# Patient Record
Sex: Female | Born: 1975
Health system: Southern US, Community
[De-identification: ages and names within clinical notes are randomized; demographics above are authoritative.]

## PROBLEM LIST (undated history)

## (undated) ENCOUNTER — Inpatient Hospital Stay (HOSPITAL_COMMUNITY): Payer: Self-pay

## (undated) DIAGNOSIS — T783XXA Angioneurotic edema, initial encounter: Secondary | ICD-10-CM

## (undated) DIAGNOSIS — E785 Hyperlipidemia, unspecified: Secondary | ICD-10-CM

## (undated) DIAGNOSIS — J45909 Unspecified asthma, uncomplicated: Secondary | ICD-10-CM

## (undated) DIAGNOSIS — T7840XA Allergy, unspecified, initial encounter: Secondary | ICD-10-CM

## (undated) DIAGNOSIS — N83209 Unspecified ovarian cyst, unspecified side: Secondary | ICD-10-CM

## (undated) DIAGNOSIS — F32A Depression, unspecified: Secondary | ICD-10-CM

## (undated) DIAGNOSIS — Z5189 Encounter for other specified aftercare: Secondary | ICD-10-CM

## (undated) DIAGNOSIS — G4700 Insomnia, unspecified: Secondary | ICD-10-CM

## (undated) DIAGNOSIS — E559 Vitamin D deficiency, unspecified: Secondary | ICD-10-CM

## (undated) DIAGNOSIS — F329 Major depressive disorder, single episode, unspecified: Secondary | ICD-10-CM

## (undated) DIAGNOSIS — L509 Urticaria, unspecified: Secondary | ICD-10-CM

## (undated) DIAGNOSIS — M25561 Pain in right knee: Secondary | ICD-10-CM

## (undated) DIAGNOSIS — U071 COVID-19: Secondary | ICD-10-CM

## (undated) DIAGNOSIS — G8929 Other chronic pain: Secondary | ICD-10-CM

## (undated) HISTORY — DX: Depression, unspecified: F32.A

## (undated) HISTORY — PX: TONSILLECTOMY: SUR1361

## (undated) HISTORY — DX: Insomnia, unspecified: G47.00

## (undated) HISTORY — DX: Hyperlipidemia, unspecified: E78.5

## (undated) HISTORY — PX: ESOPHAGOGASTRODUODENOSCOPY: SHX1529

## (undated) HISTORY — DX: Unspecified asthma, uncomplicated: J45.909

## (undated) HISTORY — DX: Vitamin D deficiency, unspecified: E55.9

## (undated) HISTORY — DX: Allergy, unspecified, initial encounter: T78.40XA

## (undated) HISTORY — DX: Major depressive disorder, single episode, unspecified: F32.9

## (undated) HISTORY — PX: COLON SURGERY: SHX602

## (undated) HISTORY — DX: Urticaria, unspecified: L50.9

## (undated) HISTORY — DX: Unspecified ovarian cyst, unspecified side: N83.209

## (undated) HISTORY — DX: Pain in right knee: M25.561

## (undated) HISTORY — DX: Encounter for other specified aftercare: Z51.89

## (undated) HISTORY — DX: Other chronic pain: G89.29

## (undated) HISTORY — PX: ADENOIDECTOMY: SUR15

## (undated) HISTORY — PX: TYMPANOSTOMY TUBE PLACEMENT: SHX32

## (undated) HISTORY — DX: Angioneurotic edema, initial encounter: T78.3XXA

## (undated) HISTORY — PX: DILATION AND CURETTAGE OF UTERUS: SHX78

---

## 2004-12-08 ENCOUNTER — Other Ambulatory Visit: Admission: RE | Admit: 2004-12-08 | Discharge: 2004-12-08 | Payer: Self-pay | Admitting: Obstetrics and Gynecology

## 2008-12-13 ENCOUNTER — Encounter: Admission: RE | Admit: 2008-12-13 | Discharge: 2008-12-13 | Payer: Self-pay | Admitting: Obstetrics and Gynecology

## 2009-07-10 ENCOUNTER — Encounter: Admission: RE | Admit: 2009-07-10 | Discharge: 2009-07-10 | Payer: Self-pay | Admitting: Obstetrics and Gynecology

## 2010-01-10 ENCOUNTER — Encounter: Admission: RE | Admit: 2010-01-10 | Discharge: 2010-01-10 | Payer: Self-pay | Admitting: Obstetrics and Gynecology

## 2010-06-05 ENCOUNTER — Encounter (HOSPITAL_COMMUNITY)
Admission: RE | Admit: 2010-06-05 | Discharge: 2010-06-05 | Disposition: A | Discharge status: Home / Self Care | Payer: BC Managed Care – PPO | Source: Ambulatory Visit | Attending: Obstetrics and Gynecology | Admitting: Obstetrics and Gynecology

## 2010-06-05 LAB — CBC
Hemoglobin: 12.4 g/dL (ref 12.0–15.0)
MCHC: 32.6 g/dL (ref 30.0–36.0)
RBC: 4.51 MIL/uL (ref 3.87–5.11)

## 2010-06-06 ENCOUNTER — Ambulatory Visit (HOSPITAL_COMMUNITY)
Admission: RE | Admit: 2010-06-06 | Discharge: 2010-06-06 | Disposition: A | Discharge status: Home / Self Care | Payer: BC Managed Care – PPO | Source: Ambulatory Visit | Attending: Obstetrics and Gynecology | Admitting: Obstetrics and Gynecology

## 2010-06-06 ENCOUNTER — Other Ambulatory Visit (HOSPITAL_COMMUNITY): Payer: Self-pay | Admitting: Obstetrics and Gynecology

## 2010-06-06 DIAGNOSIS — N920 Excessive and frequent menstruation with regular cycle: Secondary | ICD-10-CM | POA: Insufficient documentation

## 2010-06-06 DIAGNOSIS — N841 Polyp of cervix uteri: Secondary | ICD-10-CM | POA: Insufficient documentation

## 2010-06-06 DIAGNOSIS — N840 Polyp of corpus uteri: Secondary | ICD-10-CM | POA: Insufficient documentation

## 2010-06-06 LAB — PREGNANCY, URINE: Preg Test, Ur: NEGATIVE

## 2010-07-17 NOTE — Op Note (Signed)
  NAME:  Cindy Gilbert, Cindy Gilbert NO.:  [id number]  MEDICAL RECORD NO.:  [id number]           PATIENT TYPE:  O  LOCATION:  [facility]                          [facility]AC[facility]  [facility]  PHYSICIAN:  Zelphia Cairo, MD    DATE OF BIRTH:  1975-06-18  DATE OF PROCEDURE:  06/06/2010 DATE OF DISCHARGE:  06/06/2010                              OPERATIVE REPORT   PREOPERATIVE DIAGNOSES: 1. Menorrhagia. 2. Suspect endometrial polyp.  PROCEDURE: 1. Cervical block. 2. Hysteroscopy. 3. Dilation and curettage.  SURGEON:  Zelphia Cairo, MD  ANESTHESIA:  General.  SPECIMEN:  Endometrial curettings to Pathology.  COMPLICATIONS:  None.  CONDITION:  Stable to recovery room.  PROCEDURE IN DETAIL:  [name] was taken to the operating room after informed consent was obtained.  She was placed in the dorsal lithotomy position using Allen stirrups, prepped and draped in sterile fashion. An in-and-out catheter was used to drain her bladder.  Bivalve speculum was placed in the vagina, and a single-tooth tenaculum was placed on the anterior lip of the cervix.  The cervix was serially dilated using Pratt dilators, and hysteroscope was inserted into the endometrial cavity. Polypoid endometrial lining was noted.  Bilateral ostia were visualized. The hysteroscope was then removed and a gentle curetting was performed. Specimen was placed on Telfa and passed off to be sent to Pathology. Polypoid masses were noted within the endometrium and at the internal cervical os.  Diagnostic hysteroscope was then removed, and a gentle curetting was performed.  Specimen was placed on Telfa and passed off to be sent to Pathology.  The hysteroscope was reinserted.  No other masses or abnormalities were noted within the cervix or uterus.  All polyps were noted to be removed.  Hysteroscope was removed.  Tenaculum was removed.  The cervix was hemostatic.  The speculum was removed.  The patient was taken to the recovery room  in stable condition.  Sponge lap, needle, and instrument counts were correct x2     Zelphia Cairo, MD     GA/MEDQ  D:  06/17/2010  T:  06/18/2010  Job:  [id number]  Electronically Signed by Zelphia Cairo MD on 07/17/2010 09:46:37 AM

## 2011-04-08 ENCOUNTER — Other Ambulatory Visit: Payer: Self-pay | Admitting: Obstetrics and Gynecology

## 2011-04-08 DIAGNOSIS — N6311 Unspecified lump in the right breast, upper outer quadrant: Secondary | ICD-10-CM

## 2011-04-16 ENCOUNTER — Ambulatory Visit
Admission: RE | Admit: 2011-04-16 | Discharge: 2011-04-16 | Disposition: A | Discharge status: Home / Self Care | Payer: BC Managed Care – PPO | Source: Ambulatory Visit | Attending: Obstetrics and Gynecology | Admitting: Obstetrics and Gynecology

## 2011-04-16 DIAGNOSIS — N6311 Unspecified lump in the right breast, upper outer quadrant: Secondary | ICD-10-CM

## 2012-06-05 ENCOUNTER — Emergency Department (HOSPITAL_COMMUNITY)
Admission: EM | Admit: 2012-06-05 | Discharge: 2012-06-06 | Disposition: A | Discharge status: Home / Self Care | Payer: BC Managed Care – PPO | Attending: Emergency Medicine | Admitting: Emergency Medicine

## 2012-06-05 ENCOUNTER — Encounter (HOSPITAL_COMMUNITY): Payer: Self-pay | Admitting: Emergency Medicine

## 2012-06-05 DIAGNOSIS — R112 Nausea with vomiting, unspecified: Secondary | ICD-10-CM

## 2012-06-05 DIAGNOSIS — R509 Fever, unspecified: Secondary | ICD-10-CM | POA: Insufficient documentation

## 2012-06-05 DIAGNOSIS — R61 Generalized hyperhidrosis: Secondary | ICD-10-CM | POA: Insufficient documentation

## 2012-06-05 DIAGNOSIS — A084 Viral intestinal infection, unspecified: Secondary | ICD-10-CM

## 2012-06-05 DIAGNOSIS — A088 Other specified intestinal infections: Secondary | ICD-10-CM | POA: Insufficient documentation

## 2012-06-05 DIAGNOSIS — R160 Hepatomegaly, not elsewhere classified: Secondary | ICD-10-CM | POA: Insufficient documentation

## 2012-06-05 DIAGNOSIS — M549 Dorsalgia, unspecified: Secondary | ICD-10-CM | POA: Insufficient documentation

## 2012-06-05 DIAGNOSIS — F17200 Nicotine dependence, unspecified, uncomplicated: Secondary | ICD-10-CM | POA: Insufficient documentation

## 2012-06-05 LAB — COMPREHENSIVE METABOLIC PANEL
ALT: 8 U/L (ref 0–35)
AST: 18 U/L (ref 0–37)
Alkaline Phosphatase: 63 U/L (ref 39–117)
CO2: 23 mEq/L (ref 19–32)
Chloride: 101 mEq/L (ref 96–112)
Creatinine, Ser: 0.7 mg/dL (ref 0.50–1.10)
GFR calc non Af Amer: >90 mL/min (ref >90)
Potassium: 4.2 mEq/L (ref 3.5–5.1)
Sodium: 135 mEq/L (ref 135–145)
Total Bilirubin: 0.6 mg/dL (ref 0.3–1.2)

## 2012-06-05 LAB — CBC WITH DIFFERENTIAL/PLATELET
Basophils Absolute: 0 10*3/uL (ref 0.0–0.1)
HCT: 39.3 % (ref 36.0–46.0)
Lymphocytes Relative: 6 % — ABNORMAL LOW (ref 12–46)
Monocytes Absolute: 0.3 10*3/uL (ref 0.1–1.0)
Neutro Abs: 8.3 10*3/uL — ABNORMAL HIGH (ref 1.7–7.7)
RDW: 12.4 % (ref 11.5–15.5)
WBC: 9.1 10*3/uL (ref 4.0–10.5)

## 2012-06-05 LAB — URINALYSIS, ROUTINE W REFLEX MICROSCOPIC
Glucose, UA: NEGATIVE mg/dL
Hgb urine dipstick: NEGATIVE
Ketones, ur: 40 mg/dL — AB
Protein, ur: NEGATIVE mg/dL

## 2012-06-05 MED ORDER — KETOROLAC TROMETHAMINE 30 MG/ML IJ SOLN
30.0000 mg | Freq: Once | INTRAMUSCULAR | Status: AC
  Administered 2012-06-05: 30 mg via INTRAVENOUS
  Filled 2012-06-05: qty 1

## 2012-06-05 MED ORDER — ONDANSETRON HCL 4 MG/2ML IJ SOLN
4.0000 mg | Freq: Once | INTRAMUSCULAR | Status: AC
  Administered 2012-06-05: 4 mg via INTRAVENOUS
  Filled 2012-06-05: qty 2

## 2012-06-05 MED ORDER — SODIUM CHLORIDE 0.9 % IV BOLUS (SEPSIS)
1000.0000 mL | Freq: Once | INTRAVENOUS | Status: AC
  Administered 2012-06-05: 1000 mL via INTRAVENOUS

## 2012-06-05 MED ORDER — METOCLOPRAMIDE HCL 5 MG/ML IJ SOLN
10.0000 mg | Freq: Once | INTRAMUSCULAR | Status: AC
  Administered 2012-06-05: 10 mg via INTRAVENOUS
  Filled 2012-06-05: qty 2

## 2012-06-05 NOTE — ED Provider Notes (Signed)
History     CSN: [id number]  Arrival date & time 06/05/12  1955   First MD Initiated Contact with Patient 06/05/12 2211      Chief Complaint  Patient presents with  . Abdominal Pain   HPI  History provided by the patient. Patient is a 37 year old female with no significant PMH who presents with complaints of acute onset nausea vomiting, diarrhea and abdominal pains. Symptoms first began with mild abdominal pain with nausea vomiting around 10 AM this morning. Patient has had multiple episodes of vomiting without hematemesis. She has had worsening generalized abdominal pain and cramping. She also reports several episodes of loose watery diarrhea without blood or mucus. Symptoms have been associated with subjective fever, chills and sweats. Patient attempted to use some Pepto-Bismol but had emesis shortly after. Denies any other aggravating or alleviating factors. She denies any recent travel. No known sick contacts. Patient does work in the school system.    History reviewed. No pertinent past medical history.  Past Surgical History  Procedure Laterality Date  . Dilation and curettage of uterus    . Tonsillectomy      No family history on file.  History  Substance Use Topics  . Smoking status: Current Some Day Smoker  . Smokeless tobacco: Not on file  . Alcohol Use: Yes     Comment: occ    OB History   Grav Para Term Preterm Abortions TAB SAB Ect Mult Living                  Review of Systems  Constitutional: Positive for fever, chills, diaphoresis and appetite change.  Respiratory: Negative for cough and shortness of breath.   Cardiovascular: Negative for chest pain.  Gastrointestinal: Positive for nausea, vomiting, abdominal pain and diarrhea. Negative for blood in stool and rectal pain.  Genitourinary: Negative for dysuria, frequency, hematuria, flank pain, vaginal bleeding and vaginal discharge.  Musculoskeletal: Positive for back pain.  All other systems reviewed  and are negative.    Allergies  Review of patient's allergies indicates no known allergies.  Home Medications   Current Outpatient Rx  Name  Route  Sig  Dispense  Refill  . bismuth subsalicylate (PEPTO BISMOL) 262 MG/15ML suspension   Oral   Take 15 mLs by mouth every 6 (six) hours as needed for indigestion.           BP 111/69  Pulse 99  Temp(Src) 100.2 F (37.9 C) (Oral)  Resp 19  Ht 5\' 2"  (1.575 m)  Wt 180 lb (81.647 kg)  BMI 32.91 kg/m2  SpO2 98%  LMP 05/29/2012  Physical Exam  Nursing note and vitals reviewed. Constitutional: She is oriented to person, place, and time. She appears well-developed and well-nourished. No distress.  HENT:  Head: Normocephalic.  Cardiovascular: Normal rate and regular rhythm.   No murmur heard. Pulmonary/Chest: Effort normal and breath sounds normal. No respiratory distress. She has no wheezes. She has no rales.  Abdominal: Soft. She exhibits no distension and no mass. There is tenderness. There is no rebound and no guarding.  Diffuse abdominal tenderness slightly worse in the upper quadrants. No rebound tenderness or peritoneal signs.  Musculoskeletal: Normal range of motion. She exhibits no edema.  Neurological: She is alert and oriented to person, place, and time.  Skin: Skin is warm and dry. No rash noted.  Psychiatric: She has a normal mood and affect. Her behavior is normal.    ED Course  Procedures   Results for  orders placed during the hospital encounter of 06/05/12  CBC WITH DIFFERENTIAL      Result Value Range   WBC 9.1  4.0 - 10.5 K/uL   RBC 4.80  3.87 - 5.11 MIL/uL   Hemoglobin 13.6  12.0 - 15.0 g/dL   HCT 16.1  09.6 - 04.5 %   MCV 81.9  78.0 - 100.0 fL   MCH 28.3  26.0 - 34.0 pg   MCHC 34.6  30.0 - 36.0 g/dL   RDW 40.9  81.1 - 91.4 %   Platelets 246  150 - 400 K/uL   Neutrophils Relative 91 (*) 43 - 77 %   Neutro Abs 8.3 (*) 1.7 - 7.7 K/uL   Lymphocytes Relative 6 (*) 12 - 46 %   Lymphs Abs 0.5 (*) 0.7 -  4.0 K/uL   Monocytes Relative 3  3 - 12 %   Monocytes Absolute 0.3  0.1 - 1.0 K/uL   Eosinophils Relative 0  0 - 5 %   Eosinophils Absolute 0.0  0.0 - 0.7 K/uL   Basophils Relative 0  0 - 1 %   Basophils Absolute 0.0  0.0 - 0.1 K/uL  COMPREHENSIVE METABOLIC PANEL      Result Value Range   Sodium 135  135 - 145 mEq/L   Potassium 4.2  3.5 - 5.1 mEq/L   Chloride 101  96 - 112 mEq/L   CO2 23  19 - 32 mEq/L   Glucose, Bld 95  70 - 99 mg/dL   BUN 18  6 - 23 mg/dL   Creatinine, Ser 7.82  0.50 - 1.10 mg/dL   Calcium 8.7  8.4 - 95.6 mg/dL   Total Protein 7.1  6.0 - 8.3 g/dL   Albumin 3.7  3.5 - 5.2 g/dL   AST 18  0 - 37 U/L   ALT 8  0 - 35 U/L   Alkaline Phosphatase 63  39 - 117 U/L   Total Bilirubin 0.6  0.3 - 1.2 mg/dL   GFR calc non Af Amer >90  >90 mL/min   GFR calc Af Amer >90  >90 mL/min  LIPASE, BLOOD      Result Value Range   Lipase 11  11 - 59 U/L  URINALYSIS, ROUTINE W REFLEX MICROSCOPIC      Result Value Range   Color, Urine YELLOW  YELLOW   APPearance CLEAR  CLEAR   Specific Gravity, Urine 1.036 (*) 1.005 - 1.030   pH 7.0  5.0 - 8.0   Glucose, UA NEGATIVE  NEGATIVE mg/dL   Hgb urine dipstick NEGATIVE  NEGATIVE   Bilirubin Urine SMALL (*) NEGATIVE   Ketones, ur 40 (*) NEGATIVE mg/dL   Protein, ur NEGATIVE  NEGATIVE mg/dL   Urobilinogen, UA 1.0  0.0 - 1.0 mg/dL   Nitrite NEGATIVE  NEGATIVE   Leukocytes, UA NEGATIVE  NEGATIVE  POCT PREGNANCY, URINE      Result Value Range   Preg Test, Ur NEGATIVE  NEGATIVE        1. Nausea vomiting and diarrhea   2. Viral enteritis       MDM  10:20 PM patient seen and evaluated. Patient appears uncomfortable but in no acute distress.  Patient feeling significantly better after IV fluids and antiemetics. She is tolerating by mouth fluids. Patient feels ready to be discharged home.      Angus Seller, PA-C 06/07/12 [phone]

## 2012-06-05 NOTE — ED Notes (Signed)
Pt c/o abd pain onset 10 am this morning. Emesis x 7 today. Diarrhea x 1. Chills, fever.

## 2012-06-06 MED ORDER — ONDANSETRON 8 MG PO TBDP: ORAL_TABLET | ORAL | Status: DC

## 2012-06-06 MED ORDER — PROMETHAZINE HCL 25 MG PO TABS: 25.0000 mg | ORAL_TABLET | Freq: Four times a day (QID) | ORAL | Status: DC | PRN

## 2012-06-07 NOTE — ED Provider Notes (Signed)
  Medical screening examination/treatment/procedure(s) were performed by non-physician practitioner and as supervising physician I was immediately available for consultation/collaboration.   Derwood Kaplan, MD 06/07/12 1517

## 2014-07-14 ENCOUNTER — Encounter (HOSPITAL_COMMUNITY): Payer: Self-pay

## 2014-07-14 ENCOUNTER — Inpatient Hospital Stay (HOSPITAL_COMMUNITY): Payer: Medicaid Other

## 2014-07-14 ENCOUNTER — Inpatient Hospital Stay (HOSPITAL_COMMUNITY)
Admission: AD | Admit: 2014-07-14 | Discharge: 2014-07-14 | Disposition: A | Discharge status: Home / Self Care | Payer: Medicaid Other | Source: Ambulatory Visit | Attending: Obstetrics and Gynecology | Admitting: Obstetrics and Gynecology

## 2014-07-14 DIAGNOSIS — N831 Corpus luteum cyst: Secondary | ICD-10-CM | POA: Insufficient documentation

## 2014-07-14 DIAGNOSIS — O99330 Smoking (tobacco) complicating pregnancy, unspecified trimester: Secondary | ICD-10-CM | POA: Diagnosis not present

## 2014-07-14 DIAGNOSIS — O4690 Antepartum hemorrhage, unspecified, unspecified trimester: Secondary | ICD-10-CM | POA: Insufficient documentation

## 2014-07-14 DIAGNOSIS — N939 Abnormal uterine and vaginal bleeding, unspecified: Secondary | ICD-10-CM | POA: Diagnosis present

## 2014-07-14 DIAGNOSIS — R58 Hemorrhage, not elsewhere classified: Secondary | ICD-10-CM

## 2014-07-14 DIAGNOSIS — Z3A00 Weeks of gestation of pregnancy not specified: Secondary | ICD-10-CM | POA: Insufficient documentation

## 2014-07-14 DIAGNOSIS — O3680X0 Pregnancy with inconclusive fetal viability, not applicable or unspecified: Secondary | ICD-10-CM

## 2014-07-14 DIAGNOSIS — O3480 Maternal care for other abnormalities of pelvic organs, unspecified trimester: Secondary | ICD-10-CM | POA: Insufficient documentation

## 2014-07-14 DIAGNOSIS — Z331 Pregnant state, incidental: Secondary | ICD-10-CM | POA: Diagnosis not present

## 2014-07-14 DIAGNOSIS — F17210 Nicotine dependence, cigarettes, uncomplicated: Secondary | ICD-10-CM | POA: Diagnosis not present

## 2014-07-14 LAB — URINE MICROSCOPIC-ADD ON

## 2014-07-14 LAB — HCG, QUANTITATIVE, PREGNANCY: hCG, Beta Chain, Quant, S: 747 m[IU]/mL — ABNORMAL HIGH (ref <5)

## 2014-07-14 LAB — URINALYSIS, ROUTINE W REFLEX MICROSCOPIC
Bilirubin Urine: NEGATIVE
Glucose, UA: NEGATIVE mg/dL
Ketones, ur: 15 mg/dL — AB
LEUKOCYTES UA: NEGATIVE
Nitrite: NEGATIVE
Protein, ur: NEGATIVE mg/dL
Specific Gravity, Urine: 1.02 (ref 1.005–1.030)
UROBILINOGEN UA: 0.2 mg/dL (ref 0.0–1.0)
pH: 6.5 (ref 5.0–8.0)

## 2014-07-14 LAB — POCT PREGNANCY, URINE: PREG TEST UR: POSITIVE — AB

## 2014-07-14 LAB — WET PREP, GENITAL
Clue Cells Wet Prep HPF POC: NONE SEEN
Trich, Wet Prep: NONE SEEN
WBC, Wet Prep HPF POC: NONE SEEN
Yeast Wet Prep HPF POC: NONE SEEN

## 2014-07-14 LAB — CBC
HCT: 34.5 % — ABNORMAL LOW (ref 36.0–46.0)
Hemoglobin: 11.9 g/dL — ABNORMAL LOW (ref 12.0–15.0)
MCH: 28.4 pg (ref 26.0–34.0)
MCHC: 34.5 g/dL (ref 30.0–36.0)
MCV: 82.3 fL (ref 78.0–100.0)
Platelets: 223 10*3/uL (ref 150–400)
RBC: 4.19 MIL/uL (ref 3.87–5.11)
RDW: 13.5 % (ref 11.5–15.5)
WBC: 6.9 10*3/uL (ref 4.0–10.5)

## 2014-07-14 NOTE — MAU Provider Note (Signed)
History     CSN: [id number]  Arrival date and time: 07/14/14 2034   First Provider Initiated Contact with Patient 07/14/14 2104      Chief Complaint  Patient presents with  . Vaginal Bleeding   Vaginal Bleeding The patient's primary symptoms include vaginal bleeding. The current episode started in the past 7 days. The problem occurs intermittently. The problem has been unchanged. The patient is experiencing no pain. She is pregnant. Pertinent negatives include no abdominal pain or fever. The vaginal discharge was bloody. The vaginal bleeding is lighter than menses. She has been passing clots. She has not been passing tissue. It is unknown whether or not her partner has an STD. She uses nothing for contraception. Her past medical history is significant for miscarriage.    39 y.o. [id number] @ 37w4dpresents to the MAU with the complaint of vaginal bleeding intermittently during the past week. She had a positive home pregnancy test yesterday. She is not expereincing any pain. She is crying because she thinks she may be having a miscarriage. She stated that she had a miscarraige in 2010. She was using the rhythm method for birth control   Past Medical History  Diagnosis Date  . Medical history non-contributory     Past Surgical History  Procedure Laterality Date  . Dilation and curettage of uterus    . Tonsillectomy      Family History  Problem Relation Age of Onset  . Heart disease Father     History  Substance Use Topics  . Smoking status: Current Some Day Smoker -- 0.25 packs/day for 10 years    Types: Cigarettes  . Smokeless tobacco: Not on file  . Alcohol Use: Yes     Comment: occ    Allergies: No Known Allergies  Prescriptions prior to admission  Medication Sig Dispense Refill Last Dose  . bismuth subsalicylate (PEPTO BISMOL) 262 MG/15ML suspension Take 15 mLs by mouth every 6 (six) hours as needed for indigestion.   06/05/2012 at Unknown  . ondansetron (ZOFRAN ODT) 8  MG disintegrating tablet 842mODT q4 hours prn nausea 20 tablet 0   . promethazine (PHENERGAN) 25 MG tablet Take 1 tablet (25 mg total) by mouth every 6 (six) hours as needed for nausea. 20 tablet 0     Review of Systems  Constitutional: Negative for fever.  Gastrointestinal: Negative for abdominal pain.  Genitourinary: Positive for vaginal bleeding.       Vaginal bleeding  All other systems reviewed and are negative.  Physical Exam   Blood pressure 114/67, pulse 69, temperature 98.8 F (37.1 C), temperature source Oral, resp. rate 18, last menstrual period 06/05/2014.  Physical Exam  Nursing note and vitals reviewed. Constitutional: She is oriented to person, place, and time. She appears well-developed and well-nourished. No distress.  HENT:  Head: Normocephalic and atraumatic.  Neck: Normal range of motion.  Cardiovascular: Normal rate.   Respiratory: Effort normal. No respiratory distress.  GI: Soft. She exhibits no distension and no mass. There is no tenderness. There is no rebound and no guarding.  Genitourinary: Uterus normal. There is no rash, tenderness, lesion or injury on the right labia. There is no rash, tenderness, lesion or injury on the left labia. Uterus is not enlarged. Cervix exhibits no motion tenderness, no discharge and no friability. Right adnexum displays no mass, no tenderness and no fullness. Left adnexum displays no mass, no tenderness and no fullness. There is bleeding in the vagina. No erythema or tenderness in  the vagina. No foreign body around the vagina. No signs of injury around the vagina. No vaginal discharge found.  Musculoskeletal: Normal range of motion. She exhibits no edema or tenderness.  Neurological: She is alert and oriented to person, place, and time.  Skin: Skin is warm and dry.  Psychiatric: She has a normal mood and affect. Her behavior is normal. Judgment and thought content normal.   Results for orders placed or performed during the  hospital encounter of 07/14/14 (from the past 24 hour(s))  Urinalysis, Routine w reflex microscopic     Status: Abnormal   Collection Time: 07/14/14  8:41 PM  Result Value Ref Range   Color, Urine YELLOW YELLOW   APPearance CLEAR CLEAR   Specific Gravity, Urine 1.020 1.005 - 1.030   pH 6.5 5.0 - 8.0   Glucose, UA NEGATIVE NEGATIVE mg/dL   Hgb urine dipstick LARGE (A) NEGATIVE   Bilirubin Urine NEGATIVE NEGATIVE   Ketones, ur 15 (A) NEGATIVE mg/dL   Protein, ur NEGATIVE NEGATIVE mg/dL   Urobilinogen, UA 0.2 0.0 - 1.0 mg/dL   Nitrite NEGATIVE NEGATIVE   Leukocytes, UA NEGATIVE NEGATIVE  Urine microscopic-add on     Status: None   Collection Time: 07/14/14  8:41 PM  Result Value Ref Range   Squamous Epithelial / LPF RARE RARE   WBC, UA 0-2 <3 WBC/hpf   RBC / HPF 3-6 <3 RBC/hpf   Bacteria, UA RARE RARE   Urine-Other MUCOUS PRESENT   Pregnancy, urine POC     Status: Abnormal   Collection Time: 07/14/14  8:49 PM  Result Value Ref Range   Preg Test, Ur POSITIVE (A) NEGATIVE  Wet prep, genital     Status: None   Collection Time: 07/14/14  9:16 PM  Result Value Ref Range   Yeast Wet Prep HPF POC NONE SEEN NONE SEEN   Trich, Wet Prep NONE SEEN NONE SEEN   Clue Cells Wet Prep HPF POC NONE SEEN NONE SEEN   WBC, Wet Prep HPF POC NONE SEEN NONE SEEN  hCG, quantitative, pregnancy     Status: Abnormal   Collection Time: 07/14/14  9:25 PM  Result Value Ref Range   hCG, Beta Chain, Quant, S 747 (H) <5 mIU/mL  CBC     Status: Abnormal   Collection Time: 07/14/14  9:25 PM  Result Value Ref Range   WBC 6.9 4.0 - 10.5 K/uL   RBC 4.19 3.87 - 5.11 MIL/uL   Hemoglobin 11.9 (L) 12.0 - 15.0 g/dL   HCT 34.5 (L) 36.0 - 46.0 %   MCV 82.3 78.0 - 100.0 fL   MCH 28.4 26.0 - 34.0 pg   MCHC 34.5 30.0 - 36.0 g/dL   RDW 13.5 11.5 - 15.5 %   Platelets 223 150 - 400 K/uL  ABO/Rh     Status: None (Preliminary result)   Collection Time: 07/14/14  9:25 PM  Result Value Ref Range   ABO/RH(D) A POS   US  Ob Comp Less 14 Wks  07/14/2014   CLINICAL DATA:  Pregnant patient with vaginal bleeding for 4 days. Beta HCG 747. Last menstrual period 06/05/2014  EXAM: OBSTETRIC <14 WK [place] AND TRANSVAGINAL OB US  TECHNIQUE: Both transabdominal and transvaginal ultrasound examinations were performed for complete evaluation of the gestation as well as the maternal uterus, adnexal regions, and pelvic cul-de-sac. Transvaginal technique was performed to assess early pregnancy.  COMPARISON:  None.  FINDINGS: Intrauterine gestational sac: Not visualized.  Yolk sac:  Not  visualized.  Embryo:  Not visualized.  Cardiac Activity: Not visualized.  Maternal uterus/adnexae: The uterus is anteverted. The endometrial thickness is 14 mm. No fluid in the endometrial canal. Left ovary measures 3.6 x 1.7 x 2.3 cm and contains a corpus luteal cyst. Normal blood flow seen. The right ovary measures 2.8 x 1.3 x 1.8 cm with normal blood flow. No pelvic free fluid.  IMPRESSION: 1. No intrauterine gestation. No findings of ectopic pregnancy. This is consistent with pregnancy of unknown location. Recommend trending of beta HCG and follow-up ultrasound in 10-14 days. 2. Corpus luteal cyst in the left ovary.   Electronically Signed   By: Jeb Levering M.D.   On: 07/14/2014 22:56   US Ob Transvaginal  07/14/2014   CLINICAL DATA:  Pregnant patient with vaginal bleeding for 4 days. Beta HCG 747. Last menstrual period 06/05/2014  EXAM: OBSTETRIC <14 WK [place] AND TRANSVAGINAL OB US  TECHNIQUE: Both transabdominal and transvaginal ultrasound examinations were performed for complete evaluation of the gestation as well as the maternal uterus, adnexal regions, and pelvic cul-de-sac. Transvaginal technique was performed to assess early pregnancy.  COMPARISON:  None.  FINDINGS: Intrauterine gestational sac: Not visualized.  Yolk sac:  Not visualized.  Embryo:  Not visualized.  Cardiac Activity: Not visualized.  Maternal uterus/adnexae: The uterus is anteverted. The  endometrial thickness is 14 mm. No fluid in the endometrial canal. Left ovary measures 3.6 x 1.7 x 2.3 cm and contains a corpus luteal cyst. Normal blood flow seen. The right ovary measures 2.8 x 1.3 x 1.8 cm with normal blood flow. No pelvic free fluid.  IMPRESSION: 1. No intrauterine gestation. No findings of ectopic pregnancy. This is consistent with pregnancy of unknown location. Recommend trending of beta HCG and follow-up ultrasound in 10-14 days. 2. Corpus luteal cyst in the left ovary.   Electronically Signed   By: Jeb Levering M.D.   On: 07/14/2014 22:56      MAU Course  Procedures  MDM R/O ectopic vs miscarriage; CBC, ABO, ultrasound , beta hcg; wet prep, gc ch cx.Spoke with Dr. Gaetano Net and pt will be discharged. She will need to follow up in their office on Monday for follow up blood work.  Assessment and Plan  Pregnancy of unknown location Vaginal Bleeding in pregnancy   Call office on Monday for follow up blood work Discharge to home [employer] 07/14/2014, 9:17 PM

## 2014-07-14 NOTE — Discharge Instructions (Signed)
Miscarriage A miscarriage is the sudden loss of an unborn baby (fetus) before the 20th week of pregnancy. Most miscarriages happen in the first 3 months of pregnancy. Sometimes, it happens before a woman even knows she is pregnant. A miscarriage is also called a "spontaneous miscarriage" or "early pregnancy loss." Having a miscarriage can be an emotional experience. Talk with your caregiver about any questions you may have about miscarrying, the grieving process, and your future pregnancy plans. CAUSES   Problems with the fetal chromosomes that make it impossible for the baby to develop normally. Problems with the baby's genes or chromosomes are most often the result of errors that occur, by chance, as the embryo divides and grows. The problems are not inherited from the parents.  Infection of the cervix or uterus.   Hormone problems.   Problems with the cervix, such as having an incompetent cervix. This is when the tissue in the cervix is not strong enough to hold the pregnancy.   Problems with the uterus, such as an abnormally shaped uterus, uterine fibroids, or congenital abnormalities.   Certain medical conditions.   Smoking, drinking alcohol, or taking illegal drugs.   Trauma.  Often, the cause of a miscarriage is unknown.  SYMPTOMS   Vaginal bleeding or spotting, with or without cramps or pain.  Pain or cramping in the abdomen or lower back.  Passing fluid, tissue, or blood clots from the vagina. DIAGNOSIS  Your caregiver will perform a physical exam. You may also have an ultrasound to confirm the miscarriage. Blood or urine tests may also be ordered. TREATMENT   Sometimes, treatment is not necessary if you naturally pass all the fetal tissue that was in the uterus. If some of the fetus or placenta remains in the body (incomplete miscarriage), tissue left behind may become infected and must be removed. Usually, a dilation and curettage (D and C) procedure is performed.  During a D and C procedure, the cervix is widened (dilated) and any remaining fetal or placental tissue is gently removed from the uterus.  Antibiotic medicines are prescribed if there is an infection. Other medicines may be given to reduce the size of the uterus (contract) if there is a lot of bleeding.  If you have Rh negative blood and your baby was Rh positive, you will need a Rh immunoglobulin shot. This shot will protect any future baby from having Rh blood problems in future pregnancies. HOME CARE INSTRUCTIONS   Your caregiver may order bed rest or may allow you to continue light activity. Resume activity as directed by your caregiver.  Have someone help with home and family responsibilities during this time.   Keep track of the number of sanitary pads you use each day and how soaked (saturated) they are. Write down this information.   Do not use tampons. Do not douche or have sexual intercourse until approved by your caregiver.   Only take over-the-counter or prescription medicines for pain or discomfort as directed by your caregiver.   Do not take aspirin. Aspirin can cause bleeding.   Keep all follow-up appointments with your caregiver.   If you or your partner have problems with grieving, talk to your caregiver or seek counseling to help cope with the pregnancy loss. Allow enough time to grieve before trying to get pregnant again.  SEEK IMMEDIATE MEDICAL CARE IF:   You have severe cramps or pain in your back or abdomen.  You have a fever.  You pass large blood clots (walnut-sized  or larger) ortissue from your vagina. Save any tissue for your caregiver to inspect.   Your bleeding increases.   You have a thick, bad-smelling vaginal discharge.  You become lightheaded, weak, or you faint.   You have chills.  MAKE SURE YOU:  Understand these instructions.  Will watch your condition.  Will get help right away if you are not doing well or get  worse. Document Released: 08/05/2000 Document Revised: 06/06/2012 Document Reviewed: 03/31/2011 [facility] Patient Information 2015 [place], [place]. This information is not intended to replace advice given to you by your health care provider. Make sure you discuss any questions you have with your health care provider.

## 2014-07-14 NOTE — MAU Note (Signed)
Patient presents to MAU c/o vaginal bleeding that started on Tuesday and has gotten worse. She reports passing some small clots while in the shower yesterday and today. Patient states the blood was a darker red at first but today is much brighter. Denies any pain and burning with urination and states she hasn't had intercourse since May 9.

## 2014-07-15 LAB — ABO/RH: ABO/RH(D): A POS

## 2014-07-15 LAB — HIV ANTIBODY (ROUTINE TESTING W REFLEX): HIV Screen 4th Generation wRfx: NONREACTIVE

## 2014-07-16 LAB — GC/CHLAMYDIA PROBE AMP (~~LOC~~) NOT AT ARMC
Chlamydia: NEGATIVE
Neisseria Gonorrhea: NEGATIVE

## 2014-07-25 ENCOUNTER — Encounter (HOSPITAL_COMMUNITY): Payer: Self-pay

## 2014-07-25 ENCOUNTER — Inpatient Hospital Stay (HOSPITAL_COMMUNITY): Payer: Medicaid Other

## 2014-07-25 ENCOUNTER — Inpatient Hospital Stay (HOSPITAL_COMMUNITY)
Admission: AD | Admit: 2014-07-25 | Discharge: 2014-07-25 | Disposition: A | Discharge status: Home / Self Care | Payer: Medicaid Other | Source: Ambulatory Visit | Attending: Obstetrics and Gynecology | Admitting: Obstetrics and Gynecology

## 2014-07-25 DIAGNOSIS — O009 Ectopic pregnancy, unspecified: Secondary | ICD-10-CM | POA: Insufficient documentation

## 2014-07-25 DIAGNOSIS — O26899 Other specified pregnancy related conditions, unspecified trimester: Secondary | ICD-10-CM

## 2014-07-25 DIAGNOSIS — R109 Unspecified abdominal pain: Secondary | ICD-10-CM

## 2014-07-25 LAB — CBC
HEMATOCRIT: 32.4 % — AB (ref 36.0–46.0)
Hemoglobin: 11 g/dL — ABNORMAL LOW (ref 12.0–15.0)
MCH: 28.1 pg (ref 26.0–34.0)
MCHC: 34 g/dL (ref 30.0–36.0)
MCV: 82.7 fL (ref 78.0–100.0)
PLATELETS: 225 10*3/uL (ref 150–400)
RBC: 3.92 MIL/uL (ref 3.87–5.11)
RDW: 13.4 % (ref 11.5–15.5)
WBC: 7.2 10*3/uL (ref 4.0–10.5)

## 2014-07-25 LAB — COMPREHENSIVE METABOLIC PANEL
ALT: 11 U/L — ABNORMAL LOW (ref 14–54)
AST: 15 U/L (ref 15–41)
Albumin: 3.8 g/dL (ref 3.5–5.0)
Alkaline Phosphatase: 135 U/L — ABNORMAL HIGH (ref 38–126)
Anion gap: 2 — ABNORMAL LOW (ref 5–15)
BUN: 13 mg/dL (ref 6–20)
CO2: 25 mmol/L (ref 22–32)
CREATININE: 0.75 mg/dL (ref 0.44–1.00)
Calcium: 8.6 mg/dL — ABNORMAL LOW (ref 8.9–10.3)
Chloride: 112 mmol/L — ABNORMAL HIGH (ref 101–111)
GFR calc Af Amer: >60 mL/min (ref >60)
GFR calc non Af Amer: >60 mL/min (ref >60)
Glucose, Bld: 90 mg/dL (ref 65–99)
Potassium: 4.1 mmol/L (ref 3.5–5.1)
Sodium: 139 mmol/L (ref 135–145)
TOTAL PROTEIN: 6.9 g/dL (ref 6.5–8.1)
Total Bilirubin: 0.5 mg/dL (ref 0.3–1.2)

## 2014-07-25 LAB — ABO/RH: ABO/RH(D): A POS

## 2014-07-25 LAB — HCG, QUANTITATIVE, PREGNANCY: HCG, BETA CHAIN, QUANT, S: 1172 m[IU]/mL — AB (ref <5)

## 2014-07-25 MED ORDER — HYDROCODONE-ACETAMINOPHEN 5-325 MG PO TABS: 1.0000 | ORAL_TABLET | ORAL | Status: DC | PRN

## 2014-07-25 NOTE — H&P (Signed)
Pt was evaluated in office today and dx with Left ectopic pregnancy.  Pt reports LLQ pain that began Monday.  Pain persists but is somewhat improved today.  Denies lightheadedness and dizziness.  Still having light VB.  PMHx:  Neg PSHx:  D&C, tonsillectomy All:  None Meds:  PNV OBHx:  SVD x 1, Ab x 2  AF, VSS Gen - NAD CV - RRR Lungs - clear Abd - soft, tender LLQ and epigastrium, no r/g Ext - NT, no edema  Blood type - A+  A/P:  Left ectopic Check quant and CBC Discussed MTX vs laparoscopy depending on lab results consider overnight observation if MTX

## 2014-07-25 NOTE — MAU Note (Signed)
Dr. Julien Girt asked Noni Saupe NP to write prescriptions.

## 2014-07-25 NOTE — MAU Note (Signed)
Pt presents from the office for evaluation of a possible ectopic pregnancy. States her pain has been increasing on the left side. Denies bleeding.

## 2014-08-17 ENCOUNTER — Other Ambulatory Visit: Payer: Self-pay | Admitting: Obstetrics and Gynecology

## 2014-08-17 ENCOUNTER — Other Ambulatory Visit (HOSPITAL_COMMUNITY)
Admission: RE | Admit: 2014-08-17 | Discharge: 2014-08-17 | Disposition: A | Discharge status: Home / Self Care | Payer: BLUE CROSS/BLUE SHIELD | Source: Ambulatory Visit | Attending: Obstetrics and Gynecology | Admitting: Obstetrics and Gynecology

## 2014-08-17 DIAGNOSIS — Z1151 Encounter for screening for human papillomavirus (HPV): Secondary | ICD-10-CM | POA: Insufficient documentation

## 2014-08-17 DIAGNOSIS — Z01419 Encounter for gynecological examination (general) (routine) without abnormal findings: Secondary | ICD-10-CM | POA: Insufficient documentation

## 2014-08-20 LAB — CYTOLOGY - PAP

## 2015-05-19 ENCOUNTER — Encounter (HOSPITAL_COMMUNITY): Payer: Self-pay | Admitting: *Deleted

## 2015-08-23 ENCOUNTER — Ambulatory Visit (INDEPENDENT_AMBULATORY_CARE_PROVIDER_SITE_OTHER): Payer: BLUE CROSS/BLUE SHIELD

## 2015-08-23 ENCOUNTER — Encounter: Payer: Self-pay | Admitting: Podiatry

## 2015-08-23 ENCOUNTER — Ambulatory Visit (INDEPENDENT_AMBULATORY_CARE_PROVIDER_SITE_OTHER): Payer: BLUE CROSS/BLUE SHIELD | Admitting: Podiatry

## 2015-08-23 VITALS — BP 117/74 | HR 65 | Resp 16

## 2015-08-23 DIAGNOSIS — M79671 Pain in right foot: Secondary | ICD-10-CM | POA: Diagnosis not present

## 2015-08-23 DIAGNOSIS — M779 Enthesopathy, unspecified: Secondary | ICD-10-CM

## 2015-08-23 DIAGNOSIS — G5781 Other specified mononeuropathies of right lower limb: Secondary | ICD-10-CM

## 2015-08-23 DIAGNOSIS — G5761 Lesion of plantar nerve, right lower limb: Secondary | ICD-10-CM

## 2015-08-23 MED ORDER — TRIAMCINOLONE ACETONIDE 10 MG/ML IJ SUSP
10.0000 mg | Freq: Once | INTRAMUSCULAR | Status: AC
  Administered 2015-08-23: 10 mg

## 2015-08-23 NOTE — Progress Notes (Signed)
   Subjective:    Patient ID: Cindy Gilbert, female    DOB: 1975-09-03, 40 y.o.   MRN: [id number]  HPI    Review of Systems     Objective:   Physical Exam        Assessment & Plan:

## 2015-08-23 NOTE — Progress Notes (Signed)
Subjective:     Patient ID: Cindy Gilbert, female   DOB: [birth year], 40 y.o.   MRN: [id number]  HPI patient presents stating I'm getting a lot of pain in my right foot and it's been going on now for at least a few months. It makes it hard for me to walk comfortably and is gradually becoming more of an issue   Review of Systems  All other systems reviewed and are negative.      Objective:   Physical Exam  Constitutional: She is oriented to person, place, and time.  Cardiovascular: Intact distal pulses.   Musculoskeletal: Normal range of motion.  Neurological: She is oriented to person, place, and time.  Skin: Skin is warm.  Nursing note and vitals reviewed.  neurovascular status intact muscle strength adequate range of motion within normal limits with patient found to have exquisite discomfort in the fourth metatarsophalangeal joint with fluid buildup and mild to moderate discomfort in the third interspace right foot but not the same as what's present in the fourth MPJ. Patient's found have good digital perfusion and is well oriented 3     Assessment:     Inflammatory capsulitis fourth MPJ right with fluid buildup or the possibility still of neuroma symptomatology    Plan:     H&P and x-rays of right foot reviewed. Today I did a proximal nerve block aspirated the fourth MPJ getting out of small amount of clear fluid and injected with a quarter cc dexamethasone Kenalog and applied thick pad to reduce pressure on the joint surface. Reappoint in 3 weeks when she returns from [place] to reevaluate and consider other treatments  Cindy Gilbert reports indicated no indications of stress fracture or arthritic condition

## 2015-09-19 ENCOUNTER — Encounter: Payer: Self-pay | Admitting: Podiatry

## 2015-09-19 ENCOUNTER — Ambulatory Visit (INDEPENDENT_AMBULATORY_CARE_PROVIDER_SITE_OTHER): Payer: BLUE CROSS/BLUE SHIELD | Admitting: Podiatry

## 2015-09-19 DIAGNOSIS — N921 Excessive and frequent menstruation with irregular cycle: Secondary | ICD-10-CM | POA: Diagnosis not present

## 2015-09-19 DIAGNOSIS — M779 Enthesopathy, unspecified: Secondary | ICD-10-CM | POA: Diagnosis not present

## 2015-09-19 DIAGNOSIS — N83202 Unspecified ovarian cyst, left side: Secondary | ICD-10-CM | POA: Diagnosis not present

## 2015-09-19 DIAGNOSIS — M79671 Pain in right foot: Secondary | ICD-10-CM | POA: Diagnosis not present

## 2015-09-19 NOTE — Progress Notes (Signed)
Subjective:     Patient ID: Cindy Gilbert, female   DOB: May 20, 1975, 40 y.o.   MRN: [id number]  HPI patient presents stating that the pain has improved but she's not been active like she needs to be and she feels that the pain will recur with activity   Review of Systems     Objective:   Physical Exam Neurovascular status intact muscle strength adequate with mild discomfort fourth MPJ right which has improved but is still present with deep palpation    Assessment:     Inflammatory capsulitis improved right but still present    Plan:     H&P x-rays reviewed and today I went ahead and did a comprehensive evaluation of this condition. I did recommended orthotics and scanned for custom orthotics to reduce all plantar pressure on both metatarsophalangeal joints

## 2015-10-11 ENCOUNTER — Ambulatory Visit: Payer: BLUE CROSS/BLUE SHIELD | Admitting: *Deleted

## 2015-10-11 DIAGNOSIS — M779 Enthesopathy, unspecified: Secondary | ICD-10-CM

## 2015-10-11 NOTE — Patient Instructions (Signed)

## 2015-10-11 NOTE — Progress Notes (Signed)
Patient ID: Cindy Gilbert, female   DOB: Feb 09, 1976, 40 y.o.   MRN: [id number]  Patient presents for orthotic pick up.  Verbal and written break in and wear instructions given.  Patient will follow up in 4 weeks if symptoms worsen or fail to improve.

## 2015-10-25 DIAGNOSIS — R51 Headache: Secondary | ICD-10-CM | POA: Diagnosis not present

## 2015-11-18 ENCOUNTER — Ambulatory Visit (INDEPENDENT_AMBULATORY_CARE_PROVIDER_SITE_OTHER): Payer: BLUE CROSS/BLUE SHIELD | Admitting: Podiatry

## 2015-11-18 ENCOUNTER — Encounter: Payer: Self-pay | Admitting: Podiatry

## 2015-11-18 VITALS — BP 106/73 | HR 67 | Resp 16

## 2015-11-18 DIAGNOSIS — M779 Enthesopathy, unspecified: Secondary | ICD-10-CM | POA: Diagnosis not present

## 2015-11-18 DIAGNOSIS — M2041 Other hammer toe(s) (acquired), right foot: Secondary | ICD-10-CM | POA: Diagnosis not present

## 2015-11-18 DIAGNOSIS — G5781 Other specified mononeuropathies of right lower limb: Secondary | ICD-10-CM

## 2015-11-18 MED ORDER — TRIAMCINOLONE ACETONIDE 10 MG/ML IJ SUSP
10.0000 mg | Freq: Once | INTRAMUSCULAR | Status: AC
  Administered 2015-11-18: 10 mg

## 2015-11-18 NOTE — Progress Notes (Signed)
Subjective:     Patient ID: Cindy Gilbert, female   DOB: 22-Apr-1975, 40 y.o.   MRN: [id number]  HPI patient presents stating I'm having discomfort in the fifth digit right with inflammatory changes and I feel like I may have a nerve that's caught up in this interspace   Review of Systems     Objective:   Physical Exam Neurovascular status intact with pain in the right fifth digit with rotation of the toe and moderate discomfort in the fourth interspace right with possibility of nerve inflammation. No indications of nerve inflammation third interspace right    Assessment:     Inflammatory changes with possibility of neuroma-like symptoms fourth interspace right foot with digital deformity of the fifth right    Plan:     Reviewed condition and advised on digital procedure if possible in future to derotate the toe and I did go ahead and injected the right fourth interspace 3 mg Kenalog 5 mg Xylocaine and discussed if it does not get better we will need to explore this area

## 2015-12-17 DIAGNOSIS — Z114 Encounter for screening for human immunodeficiency virus [HIV]: Secondary | ICD-10-CM | POA: Diagnosis not present

## 2015-12-17 DIAGNOSIS — Z1231 Encounter for screening mammogram for malignant neoplasm of breast: Secondary | ICD-10-CM | POA: Diagnosis not present

## 2015-12-17 DIAGNOSIS — N938 Other specified abnormal uterine and vaginal bleeding: Secondary | ICD-10-CM | POA: Diagnosis not present

## 2015-12-17 DIAGNOSIS — Z1159 Encounter for screening for other viral diseases: Secondary | ICD-10-CM | POA: Diagnosis not present

## 2015-12-17 DIAGNOSIS — Z1329 Encounter for screening for other suspected endocrine disorder: Secondary | ICD-10-CM | POA: Diagnosis not present

## 2015-12-17 DIAGNOSIS — Z131 Encounter for screening for diabetes mellitus: Secondary | ICD-10-CM | POA: Diagnosis not present

## 2015-12-17 DIAGNOSIS — Z1151 Encounter for screening for human papillomavirus (HPV): Secondary | ICD-10-CM | POA: Diagnosis not present

## 2015-12-17 DIAGNOSIS — Z01419 Encounter for gynecological examination (general) (routine) without abnormal findings: Secondary | ICD-10-CM | POA: Diagnosis not present

## 2015-12-17 DIAGNOSIS — Z113 Encounter for screening for infections with a predominantly sexual mode of transmission: Secondary | ICD-10-CM | POA: Diagnosis not present

## 2015-12-17 LAB — HM MAMMOGRAPHY

## 2015-12-17 LAB — HM PAP SMEAR: HM PAP: NEGATIVE

## 2015-12-17 LAB — HM HIV SCREENING LAB: HM HIV Screening: NEGATIVE

## 2015-12-17 LAB — HM HEPATITIS C SCREENING LAB: HM Hepatitis Screen: NEGATIVE

## 2015-12-17 LAB — HEMOGLOBIN A1C: Hemoglobin A1C: 5.3

## 2016-01-10 DIAGNOSIS — N938 Other specified abnormal uterine and vaginal bleeding: Secondary | ICD-10-CM | POA: Diagnosis not present

## 2016-01-24 ENCOUNTER — Ambulatory Visit: Payer: BLUE CROSS/BLUE SHIELD | Admitting: Podiatry

## 2016-02-06 DIAGNOSIS — G43109 Migraine with aura, not intractable, without status migrainosus: Secondary | ICD-10-CM | POA: Diagnosis not present

## 2016-02-06 DIAGNOSIS — G43B0 Ophthalmoplegic migraine, not intractable: Secondary | ICD-10-CM | POA: Diagnosis not present

## 2016-06-24 DIAGNOSIS — J329 Chronic sinusitis, unspecified: Secondary | ICD-10-CM | POA: Diagnosis not present

## 2016-06-24 DIAGNOSIS — R0981 Nasal congestion: Secondary | ICD-10-CM | POA: Diagnosis not present

## 2016-07-02 DIAGNOSIS — L858 Other specified epidermal thickening: Secondary | ICD-10-CM | POA: Diagnosis not present

## 2016-07-02 DIAGNOSIS — L810 Postinflammatory hyperpigmentation: Secondary | ICD-10-CM | POA: Diagnosis not present

## 2016-07-03 DIAGNOSIS — M7541 Impingement syndrome of right shoulder: Secondary | ICD-10-CM | POA: Diagnosis not present

## 2016-07-15 DIAGNOSIS — M7541 Impingement syndrome of right shoulder: Secondary | ICD-10-CM | POA: Diagnosis not present

## 2016-07-17 ENCOUNTER — Ambulatory Visit (INDEPENDENT_AMBULATORY_CARE_PROVIDER_SITE_OTHER): Payer: BLUE CROSS/BLUE SHIELD | Admitting: Podiatry

## 2016-07-17 DIAGNOSIS — G5761 Lesion of plantar nerve, right lower limb: Secondary | ICD-10-CM

## 2016-07-17 MED ORDER — BETAMETHASONE SOD PHOS & ACET 6 (3-3) MG/ML IJ SUSP
3.0000 mg | Freq: Once | INTRAMUSCULAR | Status: DC
Start: 2016-07-17 — End: 2016-09-22

## 2016-07-17 NOTE — Progress Notes (Signed)
   HPI:  41 year old female otherwise healthy presents today for evaluation of pain to the right foot is been going on for several years. Patient was last seen back in September 2017 under the care of Dr. Paulla Dolly and she was diagnosed with a neuroma of the right foot. Patient states that she received an injection helped for several months. Patient presents today for further treatment and evaluation    Physical Exam: General: The patient is alert and oriented x3 in no acute distress.  Dermatology: Skin is warm, dry and supple bilateral lower extremities. Negative for open lesions or macerations.  Vascular: Palpable pedal pulses bilaterally. No edema or erythema noted. Capillary refill within normal limits.  Neurological: Epicritic and protective threshold grossly intact bilaterally.   Musculoskeletal Exam: Sharp pain with palpation of the fourth interspace and lateral compression of the metatarsal heads consistent with neuroma.  Positive Conley Canal sign with loadbearing of the forefoot.  Assessment: 1.  Morton's neuroma fourth interspace right foot   Plan of Care:  1. Patient was evaluated. X-rays taken on last visit were reviewed today 2. Injection of 0.5 mL Celestone Soluspan injected into the fourth interspace right foot. 3. Today we discussed the treatment modalities for neuromas including appropriate shoe gear, anti-inflammatories, and ultimately surgery is a possibility in the future 4. Return to clinic when necessary   Edrick Kins, DPM [facility]  Dr. Edrick Kins, [place]                                        [place], [place] [address]                Office [phone]  Fax [phone]

## 2016-07-29 DIAGNOSIS — M9901 Segmental and somatic dysfunction of cervical region: Secondary | ICD-10-CM | POA: Diagnosis not present

## 2016-07-29 DIAGNOSIS — M62830 Muscle spasm of back: Secondary | ICD-10-CM | POA: Diagnosis not present

## 2016-07-29 DIAGNOSIS — M9902 Segmental and somatic dysfunction of thoracic region: Secondary | ICD-10-CM | POA: Diagnosis not present

## 2016-07-29 DIAGNOSIS — M5412 Radiculopathy, cervical region: Secondary | ICD-10-CM | POA: Diagnosis not present

## 2016-07-31 DIAGNOSIS — M9902 Segmental and somatic dysfunction of thoracic region: Secondary | ICD-10-CM | POA: Diagnosis not present

## 2016-07-31 DIAGNOSIS — M9901 Segmental and somatic dysfunction of cervical region: Secondary | ICD-10-CM | POA: Diagnosis not present

## 2016-07-31 DIAGNOSIS — M5412 Radiculopathy, cervical region: Secondary | ICD-10-CM | POA: Diagnosis not present

## 2016-07-31 DIAGNOSIS — M62830 Muscle spasm of back: Secondary | ICD-10-CM | POA: Diagnosis not present

## 2016-08-04 DIAGNOSIS — M5412 Radiculopathy, cervical region: Secondary | ICD-10-CM | POA: Diagnosis not present

## 2016-08-04 DIAGNOSIS — M9902 Segmental and somatic dysfunction of thoracic region: Secondary | ICD-10-CM | POA: Diagnosis not present

## 2016-08-04 DIAGNOSIS — M62830 Muscle spasm of back: Secondary | ICD-10-CM | POA: Diagnosis not present

## 2016-08-04 DIAGNOSIS — M9901 Segmental and somatic dysfunction of cervical region: Secondary | ICD-10-CM | POA: Diagnosis not present

## 2016-08-05 DIAGNOSIS — M9902 Segmental and somatic dysfunction of thoracic region: Secondary | ICD-10-CM | POA: Diagnosis not present

## 2016-08-05 DIAGNOSIS — M62830 Muscle spasm of back: Secondary | ICD-10-CM | POA: Diagnosis not present

## 2016-08-05 DIAGNOSIS — M9901 Segmental and somatic dysfunction of cervical region: Secondary | ICD-10-CM | POA: Diagnosis not present

## 2016-08-05 DIAGNOSIS — M5412 Radiculopathy, cervical region: Secondary | ICD-10-CM | POA: Diagnosis not present

## 2016-08-07 DIAGNOSIS — M9902 Segmental and somatic dysfunction of thoracic region: Secondary | ICD-10-CM | POA: Diagnosis not present

## 2016-08-07 DIAGNOSIS — M9901 Segmental and somatic dysfunction of cervical region: Secondary | ICD-10-CM | POA: Diagnosis not present

## 2016-08-07 DIAGNOSIS — M62830 Muscle spasm of back: Secondary | ICD-10-CM | POA: Diagnosis not present

## 2016-08-07 DIAGNOSIS — M5412 Radiculopathy, cervical region: Secondary | ICD-10-CM | POA: Diagnosis not present

## 2016-08-10 DIAGNOSIS — M9901 Segmental and somatic dysfunction of cervical region: Secondary | ICD-10-CM | POA: Diagnosis not present

## 2016-08-10 DIAGNOSIS — M62830 Muscle spasm of back: Secondary | ICD-10-CM | POA: Diagnosis not present

## 2016-08-10 DIAGNOSIS — M5412 Radiculopathy, cervical region: Secondary | ICD-10-CM | POA: Diagnosis not present

## 2016-08-10 DIAGNOSIS — M9902 Segmental and somatic dysfunction of thoracic region: Secondary | ICD-10-CM | POA: Diagnosis not present

## 2016-08-13 DIAGNOSIS — M5412 Radiculopathy, cervical region: Secondary | ICD-10-CM | POA: Diagnosis not present

## 2016-08-13 DIAGNOSIS — M62830 Muscle spasm of back: Secondary | ICD-10-CM | POA: Diagnosis not present

## 2016-08-13 DIAGNOSIS — M9902 Segmental and somatic dysfunction of thoracic region: Secondary | ICD-10-CM | POA: Diagnosis not present

## 2016-08-13 DIAGNOSIS — M9901 Segmental and somatic dysfunction of cervical region: Secondary | ICD-10-CM | POA: Diagnosis not present

## 2016-08-18 DIAGNOSIS — M9902 Segmental and somatic dysfunction of thoracic region: Secondary | ICD-10-CM | POA: Diagnosis not present

## 2016-08-18 DIAGNOSIS — M9901 Segmental and somatic dysfunction of cervical region: Secondary | ICD-10-CM | POA: Diagnosis not present

## 2016-08-18 DIAGNOSIS — M62830 Muscle spasm of back: Secondary | ICD-10-CM | POA: Diagnosis not present

## 2016-08-18 DIAGNOSIS — M5412 Radiculopathy, cervical region: Secondary | ICD-10-CM | POA: Diagnosis not present

## 2016-08-19 DIAGNOSIS — M5412 Radiculopathy, cervical region: Secondary | ICD-10-CM | POA: Diagnosis not present

## 2016-08-19 DIAGNOSIS — M62830 Muscle spasm of back: Secondary | ICD-10-CM | POA: Diagnosis not present

## 2016-08-19 DIAGNOSIS — M9901 Segmental and somatic dysfunction of cervical region: Secondary | ICD-10-CM | POA: Diagnosis not present

## 2016-08-19 DIAGNOSIS — M9902 Segmental and somatic dysfunction of thoracic region: Secondary | ICD-10-CM | POA: Diagnosis not present

## 2016-08-21 ENCOUNTER — Ambulatory Visit: Payer: BLUE CROSS/BLUE SHIELD

## 2016-08-21 ENCOUNTER — Ambulatory Visit (INDEPENDENT_AMBULATORY_CARE_PROVIDER_SITE_OTHER): Payer: BLUE CROSS/BLUE SHIELD

## 2016-08-21 ENCOUNTER — Ambulatory Visit (INDEPENDENT_AMBULATORY_CARE_PROVIDER_SITE_OTHER): Payer: BLUE CROSS/BLUE SHIELD | Admitting: Podiatry

## 2016-08-21 ENCOUNTER — Encounter: Payer: Self-pay | Admitting: Podiatry

## 2016-08-21 DIAGNOSIS — M79671 Pain in right foot: Secondary | ICD-10-CM

## 2016-08-21 DIAGNOSIS — M62830 Muscle spasm of back: Secondary | ICD-10-CM | POA: Diagnosis not present

## 2016-08-21 DIAGNOSIS — M9902 Segmental and somatic dysfunction of thoracic region: Secondary | ICD-10-CM | POA: Diagnosis not present

## 2016-08-21 DIAGNOSIS — G5761 Lesion of plantar nerve, right lower limb: Secondary | ICD-10-CM | POA: Diagnosis not present

## 2016-08-21 DIAGNOSIS — M9901 Segmental and somatic dysfunction of cervical region: Secondary | ICD-10-CM | POA: Diagnosis not present

## 2016-08-21 DIAGNOSIS — M5412 Radiculopathy, cervical region: Secondary | ICD-10-CM | POA: Diagnosis not present

## 2016-08-21 MED ORDER — BETAMETHASONE SOD PHOS & ACET 6 (3-3) MG/ML IJ SUSP: 3.0000 mg | Freq: Once | INTRAMUSCULAR | Status: DC

## 2016-08-21 NOTE — Progress Notes (Signed)
   HPI:  Patient presents today for follow-up treatment and evaluation of a Morton's neuroma fourth interspace right foot. She states that that area feels better however she does feel some tenderness in the ball of her left foot as well as the fourth interdigital space of the left foot. She believes that the injection did help. She presents today for further treatment and evaluation    Physical Exam: General: The patient is alert and oriented x3 in no acute distress.  Dermatology: Skin is warm, dry and supple bilateral lower extremities. Negative for open lesions or macerations.  Vascular: Palpable pedal pulses bilaterally. No edema or erythema noted. Capillary refill within normal limits.  Neurological: Epicritic and protective threshold grossly intact bilaterally.   Musculoskeletal Exam: Sharp pain with palpation of the third interspace and lateral compression of the metatarsal heads consistent with neuroma.  Positive Conley Canal sign with loadbearing of the forefoot. There is some minimal discomfort noted to the fourth interdigital space during palpation and lateral compression of the metatarsal heads, however this appears to be more symptomatically to the third interspace  Assessment: 1.  Morton's neuroma third interspace right foot   Plan of Care:  1. Patient was evaluated. 2. Injection of 0.5 mL Celestone Soluspan injected into the third interspace right foot. 3. Today we discussed the treatment modalities for neuromas including appropriate shoe gear, anti-inflammatories, and ultimately surgery is a possibility in the future. Today remain continue offloading metatarsal pads and recommended good shoe gear 4. Return to clinic when necessary   patient is going on a trip to [place] with her girlfriend from college next week     Edrick Kins, DPM [facility]  Dr. Edrick Kins, [place]                                        [place], [place]  [address]                Office [phone]  Fax [phone]

## 2016-08-24 DIAGNOSIS — M62830 Muscle spasm of back: Secondary | ICD-10-CM | POA: Diagnosis not present

## 2016-08-24 DIAGNOSIS — M9901 Segmental and somatic dysfunction of cervical region: Secondary | ICD-10-CM | POA: Diagnosis not present

## 2016-08-24 DIAGNOSIS — M9902 Segmental and somatic dysfunction of thoracic region: Secondary | ICD-10-CM | POA: Diagnosis not present

## 2016-08-24 DIAGNOSIS — M5412 Radiculopathy, cervical region: Secondary | ICD-10-CM | POA: Diagnosis not present

## 2016-09-18 ENCOUNTER — Ambulatory Visit: Payer: BLUE CROSS/BLUE SHIELD | Admitting: Podiatry

## 2016-09-22 ENCOUNTER — Ambulatory Visit (INDEPENDENT_AMBULATORY_CARE_PROVIDER_SITE_OTHER): Payer: BLUE CROSS/BLUE SHIELD | Admitting: Family Medicine

## 2016-09-22 ENCOUNTER — Encounter: Payer: Self-pay | Admitting: Family Medicine

## 2016-09-22 VITALS — BP 119/83 | HR 77 | Temp 98.3°F | Ht 63.1 in | Wt 216.0 lb

## 2016-09-22 DIAGNOSIS — Z13220 Encounter for screening for lipoid disorders: Secondary | ICD-10-CM | POA: Diagnosis not present

## 2016-09-22 DIAGNOSIS — R635 Abnormal weight gain: Secondary | ICD-10-CM

## 2016-09-22 DIAGNOSIS — R5383 Other fatigue: Secondary | ICD-10-CM

## 2016-09-22 DIAGNOSIS — F5101 Primary insomnia: Secondary | ICD-10-CM | POA: Diagnosis not present

## 2016-09-22 DIAGNOSIS — E559 Vitamin D deficiency, unspecified: Secondary | ICD-10-CM

## 2016-09-22 DIAGNOSIS — R1012 Left upper quadrant pain: Secondary | ICD-10-CM | POA: Diagnosis not present

## 2016-09-22 NOTE — Progress Notes (Signed)
BP 119/83 (BP Location: Left Arm, Patient Position: Sitting, Cuff Size: Large)   Pulse 77   Temp 98.3 F (36.8 C)   Ht 5' 3.1" (1.603 m)   Wt 216 lb (98 kg)   LMP 08/28/2016   SpO2 99%   BMI 38.14 kg/m    Subjective:    Patient ID: Cindy Gilbert, female    DOB: [birth year], 41 y.o.   MRN: [id number]  HPI: Cindy Gilbert is a 41 y.o. female who presents today to establish care.   Chief Complaint  Patient presents with  . Establish Care  . Weight Gain    Patient states that she has gained about 15 pounds in the last year  . vitamin defiency   WEIGHT GAIN Duration: Has been gaining weight for about 4 years. She weighed 170 4 years ago. Hasn't changed a lot. Has been exercising regularly, Has been eating well. Exercise has gone down a little bit. Diet "good" but wouldn't mind talking to nutritionist. No breakfast, salad and tuna for lunch, dinner usually a meat and some veggies, does a decent amount of carbs- loves them, bagel here and there. Sleep is not good. She wakes up every night between 2-3:30 up for at least an hour, sometimes all day, goes to bed 10:30-11 Previous attempts at weight loss: yes Complications of obesity: None Peak weight: current Weight loss goal: 170 Weight loss to date: none Requesting obesity pharmacotherapy: no  Doing very well with sleep hygeine. She just wakes up. Has tried OTC sleep aids (z-quil, benadryl-made her feel hung over in the AM). Meditates. Has good bed time routine. Tried melatonin in the past, not lately. Doesn't know snore, no known apnea.   Had a vitamin D def diagnosed in October- took a megadose of the vitamin D, took it for 12 dose, didn't get rechecked. Still feeling tired. Achey, muscle fatigue. Mood has been up and down. Has been taking a 2000 IU D3 supplement daily, but it doesn't seem to be cutting it. She had her physical with regular blood work done in October.    Active Ambulatory Problems    Diagnosis Date Noted  .  No Active Ambulatory Problems   Resolved Ambulatory Problems    Diagnosis Date Noted  . No Resolved Ambulatory Problems   Past Medical History:  Diagnosis Date  . Allergy   . Depression   . Medical history non-contributory    Past Surgical History:  Procedure Laterality Date  . ADENOIDECTOMY    . DILATION AND CURETTAGE OF UTERUS    . TONSILLECTOMY     Outpatient Encounter Prescriptions as of 09/22/2016  Medication Sig  . ALPRAZolam (XANAX) 0.25 MG tablet Take 0.25 mg by mouth at bedtime as needed for anxiety.  . fluticasone (FLONASE) 50 MCG/ACT nasal spray   . [DISCONTINUED] sulfamethoxazole-trimethoprim (BACTRIM DS,SEPTRA DS) 800-160 MG tablet   . [DISCONTINUED] betamethasone acetate-betamethasone sodium phosphate (CELESTONE) injection 3 mg   . [DISCONTINUED] betamethasone acetate-betamethasone sodium phosphate (CELESTONE) injection 3 mg    No facility-administered encounter medications on file as of 09/22/2016.    Allergies  Allergen Reactions  . Ambien [Zolpidem Tartrate] Itching  . Bupropion Itching   Family History  Problem Relation Age of Onset  . Heart disease Father   . Stroke Father   . Atrial fibrillation Maternal Aunt   . Cancer Paternal Aunt        Breast  . Heart murmur Maternal Grandmother   . Asthma Paternal Grandmother    Social  History   Social History  . Marital status: Single    Spouse name: N/A  . Number of children: N/A  . Years of education: N/A   Occupational History  . Not on file.   Social History Main Topics  . Smoking status: Former Smoker    Packs/day: 0.25    Years: 10.00    Types: Cigarettes    Quit date: 02/23/2014  . Smokeless tobacco: Never Used  . Alcohol use 3.6 oz/week    6 Glasses of wine per week  . Drug use: No  . Sexual activity: Not Currently    Birth control/ protection: None   Other Topics Concern  . Not on file   Social History Narrative  . No narrative on file   Review of Systems  Constitutional:  Positive for fatigue and unexpected weight change. Negative for activity change, appetite change, chills, diaphoresis and fever.  HENT: Negative.   Respiratory: Negative.   Cardiovascular: Negative.   Gastrointestinal: Positive for abdominal pain. Negative for abdominal distention, anal bleeding, blood in stool, constipation, diarrhea, nausea, rectal pain and vomiting.  Psychiatric/Behavioral: Positive for sleep disturbance. Negative for agitation, behavioral problems, confusion, decreased concentration, dysphoric mood, hallucinations, self-injury and suicidal ideas. The patient is not nervous/anxious and is not hyperactive.     Per HPI unless specifically indicated above     Objective:    BP 119/83 (BP Location: Left Arm, Patient Position: Sitting, Cuff Size: Large)   Pulse 77   Temp 98.3 F (36.8 C)   Ht 5' 3.1" (1.603 m)   Wt 216 lb (98 kg)   LMP 08/28/2016   SpO2 99%   BMI 38.14 kg/m   Wt Readings from Last 3 Encounters:  09/22/16 216 lb (98 kg)  07/25/14 202 lb (91.6 kg)  06/05/12 180 lb (81.6 kg)    Physical Exam  Constitutional: She is oriented to person, place, and time. She appears well-developed and well-nourished. No distress.  HENT:  Head: Normocephalic and atraumatic.  Right Ear: Hearing normal.  Left Ear: Hearing normal.  Nose: Nose normal.  Eyes: Conjunctivae and lids are normal. Right eye exhibits no discharge. Left eye exhibits no discharge. No scleral icterus.  Cardiovascular: Normal rate, regular rhythm, normal heart sounds and intact distal pulses.  Exam reveals no gallop and no friction rub.   No murmur heard. Pulmonary/Chest: Effort normal and breath sounds normal. No respiratory distress. She has no wheezes. She has no rales. She exhibits no tenderness.  Abdominal: Soft. Bowel sounds are normal. She exhibits no distension and no mass. There is tenderness (over diaphragm on the L). There is no rebound and no guarding.  Musculoskeletal: Normal range of  motion.  Neurological: She is alert and oriented to person, place, and time.  Skin: Skin is warm, dry and intact. No rash noted. She is not diaphoretic. No erythema. No pallor.  Psychiatric: She has a normal mood and affect. Her speech is normal and behavior is normal. Judgment and thought content normal. Cognition and memory are normal.  Nursing note and vitals reviewed.   Results for orders placed or performed in visit on 08/17/14  Cytology - PAP  Result Value Ref Range   CYTOLOGY - PAP PAP RESULT       Assessment & Plan:   Problem List Items Addressed This Visit    None    Visit Diagnoses    Vitamin D deficiency    -  Primary   Rechecking labs today. Await results.  Relevant Orders   VITAMIN D 25 Hydroxy (Vit-D Deficiency, Fractures)   Fatigue, unspecified type       Checking labs and sleep study. Await results.    Relevant Orders   CBC with Differential/Platelet   Comprehensive metabolic panel   TSH   UA/M w/rflx Culture, Routine   Screening for cholesterol level       Labs drawn today. Await results.   Relevant Orders   Lipid Panel w/o Chol/HDL Ratio   Weight gain       Likely due to sleep issues. Will check labs. Obtain sleep study. Continue to monitor.    Relevant Orders   CBC with Differential/Platelet   Comprehensive metabolic panel   TSH   UA/M w/rflx Culture, Routine   Primary insomnia       Will use shut-i and obtain sleep study. Await results.   Relevant Orders   CBC with Differential/Platelet   Comprehensive metabolic panel   TSH   UA/M w/rflx Culture, Routine   Ambulatory referral to Sleep Studies   LUQ discomfort       Appears to be diaphragm related. Will check labs. Work on deep breathing. Call if not getting better or getting worse.        Follow up plan: Return in about 3 months (around 12/23/2016), or Physical, pending results, for Records release please.

## 2016-09-22 NOTE — Patient Instructions (Signed)
Shut-i

## 2016-09-23 ENCOUNTER — Other Ambulatory Visit: Payer: Self-pay | Admitting: Family Medicine

## 2016-09-23 LAB — COMPREHENSIVE METABOLIC PANEL
A/G RATIO: 1.8 (ref 1.2–2.2)
ALBUMIN: 4.2 g/dL (ref 3.5–5.5)
ALT: 16 IU/L (ref 0–32)
AST: 18 IU/L (ref 0–40)
Alkaline Phosphatase: 68 IU/L (ref 39–117)
BILIRUBIN TOTAL: 0.6 mg/dL (ref 0.0–1.2)
BUN / CREAT RATIO: 14 (ref 9–23)
BUN: 11 mg/dL (ref 6–24)
CHLORIDE: 103 mmol/L (ref 96–106)
CO2: 26 mmol/L (ref 20–29)
Calcium: 9.2 mg/dL (ref 8.7–10.2)
Creatinine, Ser: 0.8 mg/dL (ref 0.57–1.00)
GFR calc non Af Amer: 93 mL/min/{1.73_m2} (ref >59)
GFR, EST AFRICAN AMERICAN: 107 mL/min/{1.73_m2} (ref >59)
GLOBULIN, TOTAL: 2.4 g/dL (ref 1.5–4.5)
Glucose: 90 mg/dL (ref 65–99)
POTASSIUM: 4.5 mmol/L (ref 3.5–5.2)
Sodium: 141 mmol/L (ref 134–144)
TOTAL PROTEIN: 6.6 g/dL (ref 6.0–8.5)

## 2016-09-23 LAB — CBC WITH DIFFERENTIAL/PLATELET
BASOS: 0 %
Basophils Absolute: 0 10*3/uL (ref 0.0–0.2)
EOS (ABSOLUTE): 0.1 10*3/uL (ref 0.0–0.4)
Eos: 2 %
HEMOGLOBIN: 13.6 g/dL (ref 11.1–15.9)
Hematocrit: 40.5 % (ref 34.0–46.6)
IMMATURE GRANS (ABS): 0 10*3/uL (ref 0.0–0.1)
Immature Granulocytes: 0 %
LYMPHS: 48 %
Lymphocytes Absolute: 2.8 10*3/uL (ref 0.7–3.1)
MCH: 28.5 pg (ref 26.6–33.0)
MCHC: 33.6 g/dL (ref 31.5–35.7)
MCV: 85 fL (ref 79–97)
Monocytes Absolute: 0.4 10*3/uL (ref 0.1–0.9)
Monocytes: 6 %
NEUTROS ABS: 2.6 10*3/uL (ref 1.4–7.0)
Neutrophils: 44 %
PLATELETS: 253 10*3/uL (ref 150–379)
RBC: 4.78 x10E6/uL (ref 3.77–5.28)
RDW: 14.2 % (ref 12.3–15.4)
WBC: 5.9 10*3/uL (ref 3.4–10.8)

## 2016-09-23 LAB — LIPID PANEL W/O CHOL/HDL RATIO
Cholesterol, Total: 206 mg/dL — ABNORMAL HIGH (ref 100–199)
HDL: 67 mg/dL (ref >39)
LDL Calculated: 127 mg/dL — ABNORMAL HIGH (ref 0–99)
Triglycerides: 61 mg/dL (ref 0–149)
VLDL Cholesterol Cal: 12 mg/dL (ref 5–40)

## 2016-09-23 LAB — TSH: TSH: 1.96 u[IU]/mL (ref 0.450–4.500)

## 2016-09-23 LAB — VITAMIN D 25 HYDROXY (VIT D DEFICIENCY, FRACTURES): Vit D, 25-Hydroxy: 28.4 ng/mL — ABNORMAL LOW (ref 30.0–100.0)

## 2016-09-23 MED ORDER — VITAMIN D (ERGOCALCIFEROL) 1.25 MG (50000 UNIT) PO CAPS: 50000.0000 [IU] | ORAL_CAPSULE | ORAL | 0 refills | Status: DC

## 2016-09-23 NOTE — Progress Notes (Signed)
ergocal

## 2016-09-28 LAB — UA/M W/RFLX CULTURE, ROUTINE
BILIRUBIN UA: NEGATIVE
GLUCOSE, UA: NEGATIVE
Ketones, UA: NEGATIVE
LEUKOCYTES UA: NEGATIVE
Nitrite, UA: NEGATIVE
PH UA: 7.5 (ref 5.0–7.5)
RBC UA: NEGATIVE
Specific Gravity, UA: 1.015 (ref 1.005–1.030)
UUROB: 0.2 mg/dL (ref 0.2–1.0)

## 2016-09-28 LAB — MICROSCOPIC EXAMINATION

## 2016-10-30 DIAGNOSIS — G43109 Migraine with aura, not intractable, without status migrainosus: Secondary | ICD-10-CM | POA: Diagnosis not present

## 2016-10-30 DIAGNOSIS — R51 Headache: Secondary | ICD-10-CM | POA: Diagnosis not present

## 2016-10-30 DIAGNOSIS — H524 Presbyopia: Secondary | ICD-10-CM | POA: Diagnosis not present

## 2016-11-17 ENCOUNTER — Encounter: Payer: Self-pay | Admitting: Family Medicine

## 2016-12-09 DIAGNOSIS — G4730 Sleep apnea, unspecified: Secondary | ICD-10-CM | POA: Diagnosis not present

## 2016-12-18 ENCOUNTER — Ambulatory Visit (INDEPENDENT_AMBULATORY_CARE_PROVIDER_SITE_OTHER): Payer: BLUE CROSS/BLUE SHIELD | Admitting: Family Medicine

## 2016-12-18 ENCOUNTER — Encounter: Payer: Self-pay | Admitting: Family Medicine

## 2016-12-18 VITALS — BP 117/83 | HR 69 | Temp 98.6°F | Ht 63.3 in | Wt 217.3 lb

## 2016-12-18 DIAGNOSIS — G4700 Insomnia, unspecified: Secondary | ICD-10-CM

## 2016-12-18 DIAGNOSIS — R0781 Pleurodynia: Secondary | ICD-10-CM | POA: Diagnosis not present

## 2016-12-18 DIAGNOSIS — Z79899 Other long term (current) drug therapy: Secondary | ICD-10-CM | POA: Diagnosis not present

## 2016-12-18 DIAGNOSIS — N926 Irregular menstruation, unspecified: Secondary | ICD-10-CM | POA: Diagnosis not present

## 2016-12-18 DIAGNOSIS — E559 Vitamin D deficiency, unspecified: Secondary | ICD-10-CM

## 2016-12-18 DIAGNOSIS — Z1239 Encounter for other screening for malignant neoplasm of breast: Secondary | ICD-10-CM

## 2016-12-18 DIAGNOSIS — Z0000 Encounter for general adult medical examination without abnormal findings: Secondary | ICD-10-CM

## 2016-12-18 HISTORY — DX: Other long term (current) drug therapy: Z79.899

## 2016-12-18 MED ORDER — ALPRAZOLAM 0.5 MG PO TABS: 0.2500 mg | ORAL_TABLET | Freq: Two times a day (BID) | ORAL | 1 refills | Status: DC | PRN

## 2016-12-18 MED ORDER — TRIAMCINOLONE ACETONIDE 0.5 % EX OINT: 1.0000 "application " | TOPICAL_OINTMENT | Freq: Two times a day (BID) | CUTANEOUS | 0 refills | Status: DC

## 2016-12-18 NOTE — Assessment & Plan Note (Signed)
See scanned document.

## 2016-12-18 NOTE — Patient Instructions (Addendum)
Health Maintenance, Female Adopting a healthy lifestyle and getting preventive care can go a long way to promote health and wellness. Talk with your health care provider about what schedule of regular examinations is right for you. This is a good chance for you to check in with your provider about disease prevention and staying healthy. In between checkups, there are plenty of things you can do on your own. Experts have done a lot of research about which lifestyle changes and preventive measures are most likely to keep you healthy. Ask your health care provider for more information. Weight and diet Eat a healthy diet  Be sure to include plenty of vegetables, fruits, low-fat dairy products, and lean protein.  Do not eat a lot of foods high in solid fats, added sugars, or salt.  Get regular exercise. This is one of the most important things you can do for your health. ? Most adults should exercise for at least 150 minutes each week. The exercise should increase your heart rate and make you sweat (moderate-intensity exercise). ? Most adults should also do strengthening exercises at least twice a week. This is in addition to the moderate-intensity exercise.  Maintain a healthy weight  Body mass index (BMI) is a measurement that can be used to identify possible weight problems. It estimates body fat based on height and weight. Your health care provider can help determine your BMI and help you achieve or maintain a healthy weight.  For females 20 years of age and older: ? A BMI below 18.5 is considered underweight. ? A BMI of 18.5 to 24.9 is normal. ? A BMI of 25 to 29.9 is considered overweight. ? A BMI of 30 and above is considered obese.  Watch levels of cholesterol and blood lipids  You should start having your blood tested for lipids and cholesterol at 41 years of age, then have this test every 5 years.  You may need to have your cholesterol levels checked more often if: ? Your lipid or  cholesterol levels are high. ? You are older than 41 years of age. ? You are at high risk for heart disease.  Cancer screening Lung Cancer  Lung cancer screening is recommended for adults 55-80 years old who are at high risk for lung cancer because of a history of smoking.  A yearly low-dose CT scan of the lungs is recommended for people who: ? Currently smoke. ? Have quit within the past 15 years. ? Have at least a 30-pack-year history of smoking. A pack year is smoking an average of one pack of cigarettes a day for 1 year.  Yearly screening should continue until it has been 15 years since you quit.  Yearly screening should stop if you develop a health problem that would prevent you from having lung cancer treatment.  Breast Cancer  Practice breast self-awareness. This means understanding how your breasts normally appear and feel.  It also means doing regular breast self-exams. Let your health care provider know about any changes, no matter how small.  If you are in your 20s or 30s, you should have a clinical breast exam (CBE) by a health care provider every 1-3 years as part of a regular health exam.  If you are 40 or older, have a CBE every year. Also consider having a breast X-ray (mammogram) every year.  If you have a family history of breast cancer, talk to your health care provider about genetic screening.  If you are at high risk   for breast cancer, talk to your health care provider about having an MRI and a mammogram every year.  Breast cancer gene (BRCA) assessment is recommended for women who have family members with BRCA-related cancers. BRCA-related cancers include: ? Breast. ? Ovarian. ? Tubal. ? Peritoneal cancers.  Results of the assessment will determine the need for genetic counseling and BRCA1 and BRCA2 testing.  Cervical Cancer Your health care provider may recommend that you be screened regularly for cancer of the pelvic organs (ovaries, uterus, and  vagina). This screening involves a pelvic examination, including checking for microscopic changes to the surface of your cervix (Pap test). You may be encouraged to have this screening done every 3 years, beginning at age 22.  For women ages 56-65, health care providers may recommend pelvic exams and Pap testing every 3 years, or they may recommend the Pap and pelvic exam, combined with testing for human papilloma virus (HPV), every 5 years. Some types of HPV increase your risk of cervical cancer. Testing for HPV may also be done on women of any age with unclear Pap test results.  Other health care providers may not recommend any screening for nonpregnant women who are considered low risk for pelvic cancer and who do not have symptoms. Ask your health care provider if a screening pelvic exam is right for you.  If you have had past treatment for cervical cancer or a condition that could lead to cancer, you need Pap tests and screening for cancer for at least 20 years after your treatment. If Pap tests have been discontinued, your risk factors (such as having a new sexual partner) need to be reassessed to determine if screening should resume. Some women have medical problems that increase the chance of getting cervical cancer. In these cases, your health care provider may recommend more frequent screening and Pap tests.  Colorectal Cancer  This type of cancer can be detected and often prevented.  Routine colorectal cancer screening usually begins at 41 years of age and continues through 41 years of age.  Your health care provider may recommend screening at an earlier age if you have risk factors for colon cancer.  Your health care provider may also recommend using home test kits to check for hidden blood in the stool.  A small camera at the end of a tube can be used to examine your colon directly (sigmoidoscopy or colonoscopy). This is done to check for the earliest forms of colorectal  cancer.  Routine screening usually begins at age 33.  Direct examination of the colon should be repeated every 5-10 years through 41 years of age. However, you may need to be screened more often if early forms of precancerous polyps or small growths are found.  Skin Cancer  Check your skin from head to toe regularly.  Tell your health care provider about any new moles or changes in moles, especially if there is a change in a mole's shape or color.  Also tell your health care provider if you have a mole that is larger than the size of a pencil eraser.  Always use sunscreen. Apply sunscreen liberally and repeatedly throughout the day.  Protect yourself by wearing long sleeves, pants, a wide-brimmed hat, and sunglasses whenever you are outside.  Heart disease, diabetes, and high blood pressure  High blood pressure causes heart disease and increases the risk of stroke. High blood pressure is more likely to develop in: ? People who have blood pressure in the high end of  the normal range (130-139/85-89 mm Hg). ? People who are overweight or obese. ? People who are African American.  If you are 21-29 years of age, have your blood pressure checked every 3-5 years. If you are 3 years of age or older, have your blood pressure checked every year. You should have your blood pressure measured twice-once when you are at a hospital or clinic, and once when you are not at a hospital or clinic. Record the average of the two measurements. To check your blood pressure when you are not at a hospital or clinic, you can use: ? An automated blood pressure machine at a pharmacy. ? A home blood pressure monitor.  If you are between 17 years and 37 years old, ask your health care provider if you should take aspirin to prevent strokes.  Have regular diabetes screenings. This involves taking a blood sample to check your fasting blood sugar level. ? If you are at a normal weight and have a low risk for diabetes,  have this test once every three years after 41 years of age. ? If you are overweight and have a high risk for diabetes, consider being tested at a younger age or more often. Preventing infection Hepatitis B  If you have a higher risk for hepatitis B, you should be screened for this virus. You are considered at high risk for hepatitis B if: ? You were born in a country where hepatitis B is common. Ask your health care provider which countries are considered high risk. ? Your parents were born in a high-risk country, and you have not been immunized against hepatitis B (hepatitis B vaccine). ? You have HIV or AIDS. ? You use needles to inject street drugs. ? You live with someone who has hepatitis B. ? You have had sex with someone who has hepatitis B. ? You get hemodialysis treatment. ? You take certain medicines for conditions, including cancer, organ transplantation, and autoimmune conditions.  Hepatitis C  Blood testing is recommended for: ? Everyone born from [birth year] through 1965. ? Anyone with known risk factors for hepatitis C.  Sexually transmitted infections (STIs)  You should be screened for sexually transmitted infections (STIs) including gonorrhea and chlamydia if: ? You are sexually active and are younger than 41 years of age. ? You are older than 41 years of age and your health care provider tells you that you are at risk for this type of infection. ? Your sexual activity has changed since you were last screened and you are at an increased risk for chlamydia or gonorrhea. Ask your health care provider if you are at risk.  If you do not have HIV, but are at risk, it may be recommended that you take a prescription medicine daily to prevent HIV infection. This is called pre-exposure prophylaxis (PrEP). You are considered at risk if: ? You are sexually active and do not regularly use condoms or know the HIV status of your partner(s). ? You take drugs by injection. ? You are  sexually active with a partner who has HIV.  Talk with your health care provider about whether you are at high risk of being infected with HIV. If you choose to begin PrEP, you should first be tested for HIV. You should then be tested every 3 months for as long as you are taking PrEP. Pregnancy  If you are premenopausal and you may become pregnant, ask your health care provider about preconception counseling.  If you may become  pregnant, take 400 to 800 micrograms (mcg) of folic acid every day.  If you want to prevent pregnancy, talk to your health care provider about birth control (contraception). Osteoporosis and menopause  Osteoporosis is a disease in which the bones lose minerals and strength with aging. This can result in serious bone fractures. Your risk for osteoporosis can be identified using a bone density scan.  If you are 65 years of age or older, or if you are at risk for osteoporosis and fractures, ask your health care provider if you should be screened.  Ask your health care provider whether you should take a calcium or vitamin D supplement to lower your risk for osteoporosis.  Menopause may have certain physical symptoms and risks.  Hormone replacement therapy may reduce some of these symptoms and risks. Talk to your health care provider about whether hormone replacement therapy is right for you. Follow these instructions at home:  Schedule regular health, dental, and eye exams.  Stay current with your immunizations.  Do not use any tobacco products including cigarettes, chewing tobacco, or electronic cigarettes.  If you are pregnant, do not drink alcohol.  If you are breastfeeding, limit how much and how often you drink alcohol.  Limit alcohol intake to no more than 1 drink per day for nonpregnant women. One drink equals 12 ounces of beer, 5 ounces of wine, or 1 ounces of hard liquor.  Do not use street drugs.  Do not share needles.  Ask your health care  provider for help if you need support or information about quitting drugs.  Tell your health care provider if you often feel depressed.  Tell your health care provider if you have ever been abused or do not feel safe at home. This information is not intended to replace advice given to you by your health care provider. Make sure you discuss any questions you have with your health care provider. Document Released: 08/25/2010 Document Revised: 07/18/2015 Document Reviewed: 11/13/2014 Elsevier Interactive Patient Education  2018 [employer]    Perimenopause Perimenopause is the time when your body begins to move into the menopause (no menstrual period for 12 straight months). It is a natural process. Perimenopause can begin 2-8 years before the menopause and usually lasts for 1 year after the menopause. During this time, your ovaries may or may not produce an egg. The ovaries vary in their production of estrogen and progesterone hormones each month. This can cause irregular menstrual periods, difficulty getting pregnant, vaginal bleeding between periods, and uncomfortable symptoms. What are the causes?  Irregular production of the ovarian hormones, estrogen and progesterone, and not ovulating every month. Other causes include:  Tumor of the pituitary gland in the brain.  Medical disease that affects the ovaries.  Radiation treatment.  Chemotherapy.  Unknown causes.  Heavy smoking and excessive alcohol intake can bring on perimenopause sooner.  What are the signs or symptoms?  Hot flashes.  Night sweats.  Irregular menstrual periods.  Decreased sex drive.  Vaginal dryness.  Headaches.  Mood swings.  Depression.  Memory problems.  Irritability.  Tiredness.  Weight gain.  Trouble getting pregnant.  The beginning of losing bone cells (osteoporosis).  The beginning of hardening of the arteries (atherosclerosis). How is this diagnosed? Your health care provider  will make a diagnosis by analyzing your age, menstrual history, and symptoms. He or she will do a physical exam and note any changes in your body, especially your female organs. Female hormone tests may or may   depending on the amount of female hormones you produce and when you produce them. However, other hormone tests may be helpful to rule out other problems. How is this treated? In some cases, no treatment is needed. The decision on whether treatment is necessary during the perimenopause should be made by you and your health care provider based on how the symptoms are affecting you and your lifestyle. Various treatments are available, such as:  Treating individual symptoms with a specific medicine for that symptom.  Herbal medicines that can help specific symptoms.  Counseling.  Group therapy.  Follow these instructions at home:  Keep track of your menstrual periods (when they occur, how heavy they are, how long between periods, and how long they last) as well as your symptoms and when they started.  Only take over-the-counter or prescription medicines as directed by your health care provider.  Sleep and rest.  Exercise.  Eat a diet that contains calcium (good for your bones) and soy (acts like the estrogen hormone).  Do not smoke.  Avoid alcoholic beverages.  Take vitamin supplements as recommended by your health care provider. Taking vitamin E may help in certain cases.  Take calcium and vitamin D supplements to help prevent bone loss.  Group therapy is sometimes helpful.  Acupuncture may help in some cases. Contact a health care provider if:  You have questions about any symptoms you are having.  You need a referral to a specialist (gynecologist, psychiatrist, or psychologist). Get help right away if:  You have vaginal bleeding.  Your period lasts longer than 8 days.  Your periods are recurring sooner than 21 days.  You have bleeding after  intercourse.  You have severe depression.  You have pain when you urinate.  You have severe headaches.  You have vision problems. This information is not intended to replace advice given to you by your health care provider. Make sure you discuss any questions you have with your health care provider. Document Released: 03/19/2004 Document Revised: 07/18/2015 Document Reviewed: 09/08/2012 Elsevier Interactive Patient Education  2017 [place]. Menopause and Herbal Products What is menopause? Menopause is the normal time of life when menstrual periods decrease in frequency and eventually stop completely. This process can take several years for some women. Menopause is complete when you have had an absence of menstruation for a full year since your last menstrual period. It usually occurs between the ages of 75 and 61. It is not common for menopause to begin before the age of 75. During menopause, your body stops producing the female hormones estrogen and progesterone. Common symptoms associated with this loss of hormones (vasomotor symptoms) are:  Hot flashes.  Hot flushes.  Night sweats.  Other common symptoms and complications of menopause include:  Decrease in sex drive.  Vaginal dryness and thinning of the walls of the vagina. This can make sex painful.  Dryness of the skin and development of wrinkles.  Headaches.  Tiredness.  Irritability.  Memory problems.  Weight gain.  Bladder infections.  Hair growth on the face and chest.  Inability to reproduce offspring (infertility).  Loss of density in the bones (osteoporosis) increasing your risk for breaks (fractures).  Depression.  Hardening and narrowing of the arteries (atherosclerosis). This increases your risk of heart attack and stroke.  What treatment options are available? There are many treatment choices for menopause symptoms. The most common treatment is hormone replacement therapy. Many alternative  therapies for menopause are emerging, including the use of herbal  products. These supplements can be found in the form of herbs, teas, oils, tinctures, and pills. Common herbal supplements for menopause are made from plants that contain phytoestrogens. Phytoestrogens are compounds that occur naturally in plants and plant products. They act like estrogen in the body. Foods and herbs that contain phytoestrogens include:  Soy.  Flax seeds.  Red clover.  Ginseng.  What menopause symptoms may be helped if I use herbal products?  Vasomotor symptoms. These may be helped by: ? Soy. Some studies show that soy may have a moderate benefit for hot flashes. ? Black cohosh. There is limited evidence indicating this may be beneficial for hot flashes.  Symptoms that are related to heart and blood vessel disease. These may be helped by soy. Studies have shown that soy can help to lower cholesterol.  Depression. This may be helped by: ? St. John's wort. There is limited evidence that shows this may help mild to moderate depression. ? Black cohosh. There is evidence that this may help depression and mood swings.  Osteoporosis. Soy may help to decrease bone loss that is associated with menopause and may prevent osteoporosis. Limited evidence indicates that red clover may offer some bone loss protection as well. Other herbal products that are commonly used during menopause lack enough evidence to support their use as a replacement for conventional menopause therapies. These products include evening primrose, ginseng, and red clover. What are the cases when herbal products should not be used during menopause? Do not use herbal products during menopause without your health care provider's approval if:  You are taking medicine.  You have a preexisting liver condition.  Are there any risks in my taking herbal products during menopause? If you choose to use herbal products to help with symptoms of menopause,  keep in mind that:  Different supplements have different and unmeasured amounts of herbal ingredients.  Herbal products are not regulated the same way that medicines are.  Concentrations of herbs may vary depending on the way they are prepared. For example, the concentration may be different in a pill, tea, oil, and tincture.  Little is known about the risks of using herbal products, particularly the risks of long-term use.  Some herbal supplements can be harmful when combined with certain medicines.  Most commonly reported side effects of herbal products are mild. However, if used improperly, many herbal supplements can cause serious problems. Talk to your health care provider before starting any herbal product. If problems develop, stop taking the supplement and let your health care provider know. This information is not intended to replace advice given to you by your health care provider. Make sure you discuss any questions you have with your health care provider. Document Released: 07/29/2007 Document Revised: 01/07/2016 Document Reviewed: 07/25/2013 Elsevier Interactive Patient Education  2017 [employer].

## 2016-12-18 NOTE — Assessment & Plan Note (Signed)
Middle of the night insomnia. Awaiting sleep study results. Will start PRN xanax for bad nights. Rx given today. Call with any concerns and recheck 3 months. (this Rx should last at least 3-6 months with refill).

## 2016-12-18 NOTE — Assessment & Plan Note (Signed)
Has completed 12 weeks of 50,000IU- will recheck levels today.

## 2016-12-18 NOTE — Progress Notes (Signed)
BP 117/83 (BP Location: Left Arm, Patient Position: Sitting, Cuff Size: Large)   Pulse 69   Temp 98.6 F (37 C)   Ht 5' 3.3" (1.608 m)   Wt 217 lb 5 oz (98.6 kg)   SpO2 100%   BMI 38.13 kg/m    Subjective:    Patient ID: Cindy Gilbert, female    DOB: 1975-09-26, 41 y.o.   MRN: [id number]  HPI: Cindy Gilbert is a 41 y.o. female presenting on 12/18/2016 for comprehensive medical examination. Current medical complaints include:  Sleep is no better. Still waking up between 3:30 and 5AM and having trouble falling back to sleep. Doesn't matter what time she goes to bed. When she is asleep, she feels like she is getting good rest. Not feeling like she is getting restful sleep. She feels like these are probably happening during the week days, almost every week days. Having good sleep hygiene.  Spot on her side has gotten no better. Still comes and goes, feels like a muscle spasm. Aches and is tight.   Had a lot of anxiety from August to now- finally starting to ease up.   ANXIETY/STRESS- takes xanax maybe a few times a month Duration:exacerbated Anxious mood: yes  Excessive worrying: yes Irritability: no  Sweating: no Nausea: no Palpitations:no Hyperventilation: no Panic attacks: no Agoraphobia: no  Obscessions/compulsions: no Depressed mood: no Depression screen PHQ 2/9 12/18/2016  Decreased Interest 0  Down, Depressed, Hopeless 0  PHQ - 2 Score 0  Altered sleeping 3  Tired, decreased energy 0  Change in appetite 0  Feeling bad or failure about yourself  1  Trouble concentrating 0  Moving slowly or fidgety/restless 0  Suicidal thoughts 0  PHQ-9 Score 4  Difficult doing work/chores Somewhat difficult   Anhedonia: no Weight changes: no Insomnia: yes hard to stay asleep  Hypersomnia: no Fatigue/loss of energy: yes Feelings of worthlessness: no Feelings of guilt: no Impaired concentration/indecisiveness: no Suicidal ideations: no  Crying spells: no Recent  Stressors/Life Changes: yes  ABNORMAL MENSTRUAL PERIODS [id number] Duration: 3 months Average interval between menses: 35-41 days Length of menses: 2-4 days Flow: heavy initially, then lighter Dysmenorrhea: no Intermenstrual bleeding:no Postcoital bleeding: no Menarche at age: 67 Sexual activity: Not sexually active History of sexually transmitted diseases: no History GYN procedures: no Abnormal pap smears: no   Dyspareunia: no Vaginal discharge:no Abdominal pain: no Galactorrhea: no Hirsuitism: yes Frequent bruising/mucosal bleeding: yes Double vision:no Hot flashes: yes  Menopausal Symptoms: yes  Depression Screen done today and results listed below:  Depression screen [facility] 2/9 12/18/2016  Decreased Interest 0  Down, Depressed, Hopeless 0  PHQ - 2 Score 0  Altered sleeping 3  Tired, decreased energy 0  Change in appetite 0  Feeling bad or failure about yourself  1  Trouble concentrating 0  Moving slowly or fidgety/restless 0  Suicidal thoughts 0  PHQ-9 Score 4  Difficult doing work/chores Somewhat difficult    Past Medical History:  Past Medical History:  Diagnosis Date  . Allergy    Seasonal  . Depression    previously on lexapro, celexa,   . Insomnia   . Ovarian cyst    left  . Vitamin D deficiency     Surgical History:  Past Surgical History:  Procedure Laterality Date  . ADENOIDECTOMY    . CESAREAN SECTION  2001  . DILATION AND CURETTAGE OF UTERUS    . TONSILLECTOMY      Medications:  Current Outpatient Prescriptions on File  Prior to Visit  Medication Sig  . fluticasone (FLONASE) 50 MCG/ACT nasal spray   . Vitamin D, Ergocalciferol, (DRISDOL) 50000 units CAPS capsule Take 1 capsule (50,000 Units total) by mouth every 14 (fourteen) days.   No current facility-administered medications on file prior to visit.     Allergies:  Allergies  Allergen Reactions  . Ambien [Zolpidem Tartrate] Itching  . Bupropion Itching  . Cymbalta [Duloxetine  Hcl] Other (See Comments)    Headache    Social History:  Social History   Social History  . Marital status: Single    Spouse name: N/A  . Number of children: N/A  . Years of education: N/A   Occupational History  . Not on file.   Social History Main Topics  . Smoking status: Former Smoker    Packs/day: 0.25    Years: 10.00    Types: Cigarettes    Quit date: 02/23/2014  . Smokeless tobacco: Never Used  . Alcohol use 3.6 oz/week    6 Glasses of wine per week  . Drug use: No  . Sexual activity: Not Currently    Birth control/ protection: None   Other Topics Concern  . Not on file   Social History Narrative  . No narrative on file   History  Smoking Status  . Former Smoker  . Packs/day: 0.25  . Years: 10.00  . Types: Cigarettes  . Quit date: 02/23/2014  Smokeless Tobacco  . Never Used   History  Alcohol Use  . 3.6 oz/week  . 6 Glasses of wine per week    Family History:  Family History  Problem Relation Age of Onset  . Heart disease Father   . Stroke Father   . Atrial fibrillation Maternal Aunt   . Cancer Paternal Aunt        Breast  . Heart murmur Maternal Grandmother   . Asthma Paternal Grandmother     Past medical history, surgical history, medications, allergies, family history and social history reviewed with patient today and changes made to appropriate areas of the chart.   Review of Systems  Constitutional: Positive for diaphoresis and malaise/fatigue. Negative for chills, fever and weight loss.  HENT: Negative.   Eyes: Positive for blurred vision. Negative for double vision, photophobia, pain, discharge and redness.  Respiratory: Negative.   Cardiovascular: Positive for palpitations and leg swelling. Negative for chest pain, orthopnea, claudication and PND.  Gastrointestinal: Negative.   Genitourinary: Negative.   Musculoskeletal: Negative.   Skin: Positive for rash. Negative for itching.  Neurological: Negative.  Negative for weakness.    Endo/Heme/Allergies: Negative.   Psychiatric/Behavioral: Negative for depression, hallucinations, memory loss, substance abuse and suicidal ideas. The patient is nervous/anxious and has insomnia.     All other ROS negative except what is listed above and in the HPI.      Objective:    BP 117/83 (BP Location: Left Arm, Patient Position: Sitting, Cuff Size: Large)   Pulse 69   Temp 98.6 F (37 C)   Ht 5' 3.3" (1.608 m)   Wt 217 lb 5 oz (98.6 kg)   SpO2 100%   BMI 38.13 kg/m   Wt Readings from Last 3 Encounters:  12/18/16 217 lb 5 oz (98.6 kg)  09/22/16 216 lb (98 kg)  07/25/14 202 lb (91.6 kg)    Physical Exam  Constitutional: She is oriented to person, place, and time. She appears well-developed and well-nourished. No distress.  HENT:  Head: Normocephalic and atraumatic.  Right  Ear: Hearing, tympanic membrane, external ear and ear canal normal.  Left Ear: Hearing, tympanic membrane, external ear and ear canal normal.  Nose: Nose normal.  Mouth/Throat: Uvula is midline, oropharynx is clear and moist and mucous membranes are normal. No oropharyngeal exudate.  Eyes: Pupils are equal, round, and reactive to light. Conjunctivae, EOM and lids are normal. Right eye exhibits no discharge. Left eye exhibits no discharge. No scleral icterus.  Neck: Normal range of motion. Neck supple. No JVD present. No tracheal deviation present. No thyromegaly present.  Cardiovascular: Normal rate, regular rhythm, normal heart sounds and intact distal pulses.  Exam reveals no gallop and no friction rub.   No murmur heard. Pulmonary/Chest: Effort normal and breath sounds normal. No stridor. No respiratory distress. She has no wheezes. She has no rales. She exhibits no tenderness. Right breast exhibits no inverted nipple, no mass, no nipple discharge, no skin change and no tenderness. Left breast exhibits no inverted nipple, no mass, no nipple discharge, no skin change and no tenderness. Breasts are  symmetrical.  Abdominal: Soft. Bowel sounds are normal. She exhibits no distension and no mass. There is no tenderness. There is no rebound and no guarding.  Genitourinary:  Genitourinary Comments: Pelvic exam deferred with shared decision making  Musculoskeletal: Normal range of motion. She exhibits no edema, tenderness or deformity.  Lymphadenopathy:    She has no cervical adenopathy.  Neurological: She is alert and oriented to person, place, and time. She has normal reflexes. She displays normal reflexes. No cranial nerve deficit. She exhibits normal muscle tone. Coordination normal.  Skin: Skin is warm, dry and intact. Rash (L forearm) noted. She is not diaphoretic. No erythema. No pallor.  Psychiatric: She has a normal mood and affect. Her speech is normal and behavior is normal. Judgment and thought content normal. Cognition and memory are normal.  Nursing note and vitals reviewed.   Results for orders placed or performed in visit on 11/17/16  HM MAMMOGRAPHY  Result Value Ref Range   HM Mammogram 0-4 Bi-Rad 0-4 Bi-Rad, Self Reported Normal  HM PAP SMEAR  Result Value Ref Range   HM Pap smear Negative, negative HPV   HM HIV SCREENING LAB  Result Value Ref Range   HM HIV Screening Negative - Validated   HM HEPATITIS C SCREENING LAB  Result Value Ref Range   HM Hepatitis Screen Negative-Validated   Hemoglobin A1c  Result Value Ref Range   Hemoglobin A1C 5.3       Assessment & Plan:   Problem List Items Addressed This Visit      Other   Vitamin D deficiency    Has completed 12 weeks of 50,000IU- will recheck levels today.      Relevant Orders   VITAMIN D 25 Hydroxy (Vit-D Deficiency, Fractures)   Controlled substance agreement signed    See scanned document.       Insomnia    Middle of the night insomnia. Awaiting sleep study results. Will start PRN xanax for bad nights. Rx given today. Call with any concerns and recheck 3 months. (this Rx should last at least 3-6  months with refill).       Other Visit Diagnoses    Routine general medical examination at a health care facility    -  Primary   Vaccines up to date. Screening labs checked last visit and normal. Pap up to date. Mammogram ordered. Continue diet and exercise.    Screening for breast cancer  Mammogram ordered today.   Relevant Orders   MM DIGITAL SCREENING BILATERAL   Rib pain on left side       Will obtain x-ray to r/o boney issues. Will start flexeril to help with spasm. Call with any concerns or if not improving.    Relevant Orders   DG Ribs Unilateral Left   Abnormal menstrual periods       Possibly peri-menopausal or due to stress. Will check labs. Await results. Call with any concerns. Rechcek 3 months.    Relevant Orders   Estradiol   Testosterone, free, total   FSH   LH   Prolactin   Thyroid Panel With TSH       Follow up plan: Return in about 3 months (around 03/20/2017) for follow up sleep.   LABORATORY TESTING:  - Pap smear: up to date  IMMUNIZATIONS:   - Tdap: Tetanus vaccination status reviewed: last tetanus booster within 10 years. - Influenza: Refused - Pneumovax: Not applicable  SCREENING: -Mammogram: Ordered today   PATIENT COUNSELING:   Advised to take 1 mg of folate supplement per day if capable of pregnancy.   Sexuality: Discussed sexually transmitted diseases, partner selection, use of condoms, avoidance of unintended pregnancy  and contraceptive alternatives.   Advised to avoid cigarette smoking.  I discussed with the patient that most people either abstain from alcohol or drink within safe limits (<=14/week and <=4 drinks/occasion for males, <=7/weeks and <= 3 drinks/occasion for females) and that the risk for alcohol disorders and other health effects rises proportionally with the number of drinks per week and how often a drinker exceeds daily limits.  Discussed cessation/primary prevention of drug use and availability of treatment for  abuse.   Diet: Encouraged to adjust caloric intake to maintain  or achieve ideal body weight, to reduce intake of dietary saturated fat and total fat, to limit sodium intake by avoiding high sodium foods and not adding table salt, and to maintain adequate dietary potassium and calcium preferably from fresh fruits, vegetables, and low-fat dairy products.    stressed the importance of regular exercise  Injury prevention: Discussed safety belts, safety helmets, smoke detector, smoking near bedding or upholstery.   Dental health: Discussed importance of regular tooth brushing, flossing, and dental visits.    NEXT PREVENTATIVE PHYSICAL DUE IN 1 YEAR. Return in about 3 months (around 03/20/2017) for follow up sleep.

## 2016-12-19 LAB — TESTOSTERONE, FREE, TOTAL, SHBG
Sex Hormone Binding: 51.9 nmol/L (ref 24.6–122.0)
TESTOSTERONE FREE: 1.4 pg/mL (ref 0.0–4.2)
TESTOSTERONE: 27 ng/dL (ref 8–48)

## 2016-12-19 LAB — LUTEINIZING HORMONE: LH: 71.2 m[IU]/mL

## 2016-12-19 LAB — THYROID PANEL WITH TSH
Free Thyroxine Index: 1.6 (ref 1.2–4.9)
T3 UPTAKE RATIO: 26 % (ref 24–39)
T4, Total: 6.3 ug/dL (ref 4.5–12.0)
TSH: 1.16 u[IU]/mL (ref 0.450–4.500)

## 2016-12-19 LAB — VITAMIN D 25 HYDROXY (VIT D DEFICIENCY, FRACTURES): Vit D, 25-Hydroxy: 22.2 ng/mL — ABNORMAL LOW (ref 30.0–100.0)

## 2016-12-19 LAB — ESTRADIOL: Estradiol: 110.6 pg/mL

## 2016-12-19 LAB — FOLLICLE STIMULATING HORMONE: FSH: 30.4 m[IU]/mL

## 2016-12-19 LAB — PROLACTIN: Prolactin: 11.5 ng/mL (ref 4.8–23.3)

## 2016-12-21 ENCOUNTER — Other Ambulatory Visit: Payer: Self-pay | Admitting: Family Medicine

## 2016-12-21 MED ORDER — VITAMIN D (ERGOCALCIFEROL) 1.25 MG (50000 UNIT) PO CAPS: 50000.0000 [IU] | ORAL_CAPSULE | ORAL | 0 refills | Status: DC

## 2016-12-30 ENCOUNTER — Ambulatory Visit
Admission: RE | Admit: 2016-12-30 | Discharge: 2016-12-30 | Disposition: A | Discharge status: Home / Self Care | Payer: BLUE CROSS/BLUE SHIELD | Source: Ambulatory Visit | Attending: Family Medicine | Admitting: Family Medicine

## 2016-12-30 DIAGNOSIS — R0781 Pleurodynia: Secondary | ICD-10-CM | POA: Insufficient documentation

## 2017-01-11 ENCOUNTER — Encounter: Payer: Self-pay | Admitting: Family Medicine

## 2017-01-19 MED ORDER — LORCASERIN HCL ER 20 MG PO TB24: 1.0000 | ORAL_TABLET | Freq: Every day | ORAL | 3 refills | Status: DC

## 2017-01-27 ENCOUNTER — Ambulatory Visit: Payer: BLUE CROSS/BLUE SHIELD | Admitting: Family Medicine

## 2017-01-27 ENCOUNTER — Telehealth: Payer: Self-pay | Admitting: Family Medicine

## 2017-01-27 NOTE — Telephone Encounter (Signed)
Copied from [place] [id number]. Topic: Quick Communication - See Telephone Encounter >> Jan 27, 2017  8:37 AM Cleaster Corin, NT wrote: CRM for notification. See Telephone encounter for:   01/27/17. Pre authorization needed for [place] pt. Called and pharmacy told her the could not refill it until then. Pt. Can be reached at [phone]  [id number] [place] [id number]: [phone]: [phone] a 24 hour pharmacy; exact hours not known

## 2017-01-28 ENCOUNTER — Encounter: Payer: Self-pay | Admitting: Family Medicine

## 2017-01-28 DIAGNOSIS — E669 Obesity, unspecified: Secondary | ICD-10-CM | POA: Insufficient documentation

## 2017-01-28 NOTE — Telephone Encounter (Signed)
Called patient,  to let her know that she needs to come in for an appointment for weight loss, prior authorization submitted, but had to have additional information.

## 2017-02-26 ENCOUNTER — Ambulatory Visit: Payer: BLUE CROSS/BLUE SHIELD | Admitting: Family Medicine

## 2017-02-26 ENCOUNTER — Ambulatory Visit: Payer: BLUE CROSS/BLUE SHIELD | Admitting: Podiatry

## 2017-02-26 ENCOUNTER — Encounter: Payer: Self-pay | Admitting: Podiatry

## 2017-02-26 DIAGNOSIS — G5761 Lesion of plantar nerve, right lower limb: Secondary | ICD-10-CM | POA: Diagnosis not present

## 2017-02-28 NOTE — Progress Notes (Signed)
   HPI:  42 year old female to the presents today for follow-up treatment and evaluation of a Morton's neuroma of the fourth interspace right foot. She states the pain is worsening and now radiates to the plantar aspect of the foot. Patient is here for further evaluation and treatment.    Past Medical History:  Diagnosis Date  . Allergy    Seasonal  . Depression    previously on lexapro, celexa,   . Insomnia   . Ovarian cyst    left  . Vitamin D deficiency      Physical Exam: General: The patient is alert and oriented x3 in no acute distress.  Dermatology: Skin is warm, dry and supple bilateral lower extremities. Negative for open lesions or macerations.  Vascular: Palpable pedal pulses bilaterally. No edema or erythema noted. Capillary refill within normal limits.  Neurological: Epicritic and protective threshold grossly intact bilaterally.   Musculoskeletal Exam: Sharp pain with palpation of the third interspace and lateral compression of the metatarsal heads consistent with neuroma.  Positive Conley Canal sign with loadbearing of the forefoot. There is some minimal discomfort noted to the fourth interdigital space during palpation and lateral compression of the metatarsal heads, however this appears to be more symptomatically to the third interspace  Assessment: 1.  Morton's neuroma third interspace right foot   Plan of Care:  1. Patient was evaluated. 2. Injection of 0.5 mL Celestone Soluspan injected into the third interspace right foot. 3.  Appointment with Liliane Channel for dress shoe custom molded orthotics. 4.  Continue wearing good shoe gear with athletic custom molded orthotics. 5.  Return to clinic as needed.   Edrick Kins, DPM [facility]  Dr. Edrick Kins, [address]                                        [place], [place] [address]                Office [phone]  Fax [phone]

## 2017-03-17 ENCOUNTER — Ambulatory Visit (INDEPENDENT_AMBULATORY_CARE_PROVIDER_SITE_OTHER): Payer: BLUE CROSS/BLUE SHIELD | Admitting: Orthotics

## 2017-03-17 DIAGNOSIS — G5781 Other specified mononeuropathies of right lower limb: Secondary | ICD-10-CM

## 2017-03-17 DIAGNOSIS — G5761 Lesion of plantar nerve, right lower limb: Secondary | ICD-10-CM | POA: Diagnosis not present

## 2017-03-17 NOTE — Progress Notes (Signed)
Patient catst today for custom foot orthotics to address morton neuroma #3/4/ Right..wants to put them into dress shoes..plan on richy to fab slim dress w/ topcover left off and neurma pad proximal to 3/4 R

## 2017-03-26 ENCOUNTER — Ambulatory Visit: Payer: BLUE CROSS/BLUE SHIELD | Admitting: Family Medicine

## 2017-04-14 ENCOUNTER — Ambulatory Visit: Payer: BLUE CROSS/BLUE SHIELD | Admitting: Orthotics

## 2017-04-14 DIAGNOSIS — G5781 Other specified mononeuropathies of right lower limb: Secondary | ICD-10-CM

## 2017-04-14 NOTE — Progress Notes (Signed)
Patient left to go to work, didn't see.

## 2017-06-26 ENCOUNTER — Other Ambulatory Visit: Payer: Self-pay | Admitting: Family Medicine

## 2017-07-16 DIAGNOSIS — Z01419 Encounter for gynecological examination (general) (routine) without abnormal findings: Secondary | ICD-10-CM | POA: Diagnosis not present

## 2017-07-16 DIAGNOSIS — Z6839 Body mass index (BMI) 39.0-39.9, adult: Secondary | ICD-10-CM | POA: Diagnosis not present

## 2017-07-16 DIAGNOSIS — Z1231 Encounter for screening mammogram for malignant neoplasm of breast: Secondary | ICD-10-CM | POA: Diagnosis not present

## 2017-07-16 DIAGNOSIS — N951 Menopausal and female climacteric states: Secondary | ICD-10-CM | POA: Diagnosis not present

## 2017-07-16 DIAGNOSIS — Z1151 Encounter for screening for human papillomavirus (HPV): Secondary | ICD-10-CM | POA: Diagnosis not present

## 2017-08-17 ENCOUNTER — Telehealth: Payer: BLUE CROSS/BLUE SHIELD | Admitting: Nurse Practitioner

## 2017-08-17 DIAGNOSIS — H5789 Other specified disorders of eye and adnexa: Secondary | ICD-10-CM

## 2017-08-17 NOTE — Progress Notes (Signed)
We are sorry that you are not feeling well.  Here is how we plan to help!  Based on what you have shared with me it looks like you have conjunctivitis.  Conjunctivitis is a common inflammatory or infectious condition of the eye that is often referred to as "pink eye".  In most cases it is contagious (viral or bacterial). However, not all conjunctivitis requires antibiotics (ex. Allergic).  We have made appropriate suggestions for you based upon your presentation.  I recommend that you use OpconA, 1-2 drops every 4-6 hours (an over the counter allergy drop available at your local pharmacy).  Your pharmacist may have an alternative suggestion.  Pink eye can be highly contagious.  It is typically spread through direct contact with secretions, or contaminated objects or surfaces that one may have touched.  Strict handwashing is suggested with soap and water is urged.  If not available, use alcohol based had sanitizer.  Avoid unnecessary touching of the eye.  If you wear contact lenses, you will need to refrain from wearing them until you see no white discharge from the eye for at least 24 hours after being on medication.  You should see symptom improvement in 1-2 days after starting the medication regimen.  Call us if symptoms are not improved in 1-2 days.  Home Care:  Wash your hands often!  Do not wear your contacts until you complete your treatment plan.  Avoid sharing towels, bed linen, personal items with a person who has pink eye.  See attention for anyone in your home with similar symptoms.  Get Help Right Away If:  Your symptoms do not improve.  You develop blurred or loss of vision.  Your symptoms worsen (increased discharge, pain or redness)  Your e-visit answers were reviewed by a board certified advanced clinical practitioner to complete your personal care plan.  Depending on the condition, your plan could have included both over the counter or prescription medications.  If there  is a problem please reply  once you have received a response from your provider.  Your safety is important to [place].  If you have drug allergies check your prescription carefully.    You can use MyChart to ask questions about today's visit, request a non-urgent call back, or ask for a work or school excuse for 24 hours related to this e-Visit. If it has been greater than 24 hours you will need to follow up with your provider, or enter a new e-Visit to address those concerns.   You will get an e-mail in the next two days asking about your experience.  I hope that your e-visit has been valuable and will speed your recovery. Thank you for using e-visits.     eye

## 2017-08-19 ENCOUNTER — Encounter: Payer: Self-pay | Admitting: Family Medicine

## 2017-08-19 ENCOUNTER — Ambulatory Visit: Payer: BLUE CROSS/BLUE SHIELD | Admitting: Family Medicine

## 2017-08-19 VITALS — BP 118/84 | HR 71 | Temp 98.4°F | Wt 217.2 lb

## 2017-08-19 DIAGNOSIS — H1013 Acute atopic conjunctivitis, bilateral: Secondary | ICD-10-CM | POA: Diagnosis not present

## 2017-08-19 MED ORDER — ERYTHROMYCIN 5 MG/GM OP OINT: 1.0000 "application " | TOPICAL_OINTMENT | Freq: Three times a day (TID) | OPHTHALMIC | 0 refills | Status: DC

## 2017-08-19 NOTE — Progress Notes (Signed)
BP 118/84 (BP Location: Left Arm, Patient Position: Sitting, Cuff Size: Large)   Pulse 71   Temp 98.4 F (36.9 C)   Wt 217 lb 4 oz (98.5 kg)   SpO2 98%   BMI 38.12 kg/m    Subjective:    Patient ID: Cindy Gilbert, female    DOB: Apr 08, 1975, 42 y.o.   MRN: [id number]  HPI: Cindy Gilbert is a 42 y.o. female  Chief Complaint  Patient presents with  . Eye Problem    X 1 month, started in the left eye and has now moved to the right eye, itches, no vision changes   EYE PROBLEM Duration:  month Involved eye:  bilateral Onset: sudden Severity: 3/10 itching Quality: itchy Foreign body sensation:no Visual impairment: no Eye redness: yes Discharge: yes Crusting or matting of eyelids: no Swelling: yes Photophobia: no Itching: yes Tearing: no Headache: no Floaters: no URI symptoms: yes- at the beginning Contact lens use: no Close contacts with similar problems: no Eye trauma: no Status: stable  Relevant past medical, surgical, family and social history reviewed and updated as indicated. Interim medical history since our last visit reviewed. Allergies and medications reviewed and updated.  Review of Systems  Constitutional: Negative.   HENT: Negative.   Eyes: Positive for discharge, redness and itching. Negative for photophobia, pain and visual disturbance.  Respiratory: Negative.   Cardiovascular: Negative.   Gastrointestinal: Negative.   Psychiatric/Behavioral: Negative.     Per HPI unless specifically indicated above     Objective:    BP 118/84 (BP Location: Left Arm, Patient Position: Sitting, Cuff Size: Large)   Pulse 71   Temp 98.4 F (36.9 C)   Wt 217 lb 4 oz (98.5 kg)   SpO2 98%   BMI 38.12 kg/m   Wt Readings from Last 3 Encounters:  08/19/17 217 lb 4 oz (98.5 kg)  12/18/16 217 lb 5 oz (98.6 kg)  09/22/16 216 lb (98 kg)     Visual Acuity Screening   Right eye Left eye Both eyes  Without correction: 20/13 20/15 20/13   With correction:        Physical Exam  Constitutional: She is oriented to person, place, and time. She appears well-developed and well-nourished. No distress.  HENT:  Head: Normocephalic and atraumatic.  Right Ear: Hearing and external ear normal.  Left Ear: Hearing and external ear normal.  Nose: Nose normal.  Mouth/Throat: Oropharynx is clear and moist. No oropharyngeal exudate.  Eyes: Pupils are equal, round, and reactive to light. EOM and lids are normal. Right eye exhibits no discharge. Left eye exhibits no discharge. Right conjunctiva is injected. Left conjunctiva is injected. No scleral icterus. Right eye exhibits normal extraocular motion and no nystagmus. Left eye exhibits normal extraocular motion and no nystagmus.  Neck: Normal range of motion. Neck supple. No JVD present. No tracheal deviation present. No thyromegaly present.  Cardiovascular: Normal rate, regular rhythm, normal heart sounds and intact distal pulses. Exam reveals no gallop and no friction rub.  No murmur heard. Pulmonary/Chest: Effort normal and breath sounds normal. No stridor. No respiratory distress. She has no wheezes. She has no rales. She exhibits no tenderness.  Musculoskeletal: Normal range of motion.  Lymphadenopathy:    She has no cervical adenopathy.  Neurological: She is alert and oriented to person, place, and time.  Skin: Skin is intact. No rash noted. She is not diaphoretic.  Psychiatric: She has a normal mood and affect. Her speech is normal and behavior is normal.  Judgment and thought content normal. Cognition and memory are normal.  Nursing note and vitals reviewed.   Results for orders placed or performed in visit on 12/18/16  VITAMIN D 25 Hydroxy (Vit-D Deficiency, Fractures)  Result Value Ref Range   Vit D, 25-Hydroxy 22.2 (L) 30.0 - 100.0 ng/mL  Estradiol  Result Value Ref Range   Estradiol 110.6 pg/mL  Testosterone, free, total  Result Value Ref Range   Testosterone 27 8 - 48 ng/dL   Testosterone,  Free 1.4 0.0 - 4.2 pg/mL   Sex Hormone Binding 51.9 24.6 - 122.0 nmol/L  FSH  Result Value Ref Range   FSH 30.4 mIU/mL  LH  Result Value Ref Range   LH 71.2 mIU/mL  Prolactin  Result Value Ref Range   Prolactin 11.5 4.8 - 23.3 ng/mL  Thyroid Panel With TSH  Result Value Ref Range   TSH 1.160 0.450 - 4.500 uIU/mL   T4, Total 6.3 4.5 - 12.0 ug/dL   T3 Uptake Ratio 26 24 - 39 %   Free Thyroxine Index 1.6 1.2 - 4.9      Assessment & Plan:   Problem List Items Addressed This Visit    None    Visit Diagnoses    Allergic conjunctivitis of both eyes    -  Primary   Will treat with erythromycin. Call if not getting better or getting worse.        Follow up plan: Return if symptoms worsen or fail to improve.

## 2017-12-28 ENCOUNTER — Ambulatory Visit: Payer: BLUE CROSS/BLUE SHIELD | Admitting: Podiatry

## 2017-12-28 ENCOUNTER — Encounter: Payer: Self-pay | Admitting: Podiatry

## 2017-12-28 DIAGNOSIS — G5761 Lesion of plantar nerve, right lower limb: Secondary | ICD-10-CM

## 2017-12-30 NOTE — Progress Notes (Signed)
   HPI:  42 year old female presenting today for follow up evaluation of a Morton's neuroma of the third interspace of the right foot. She states the pain began flaring up about three weeks ago and describes it as burning and numbness in the toes. She has had injections in the past which helped alleviate her symptoms. Wearing shoes increases the pain. She has not done anything for treatment. Patient is here for further evaluation and treatment.    Past Medical History:  Diagnosis Date  . Allergy    Seasonal  . Depression    previously on lexapro, celexa,   . Insomnia   . Ovarian cyst    left  . Vitamin D deficiency      Physical Exam: General: The patient is alert and oriented x3 in no acute distress.  Dermatology: Skin is warm, dry and supple bilateral lower extremities. Negative for open lesions or macerations.  Vascular: Palpable pedal pulses bilaterally. No edema or erythema noted. Capillary refill within normal limits.  Neurological: Epicritic and protective threshold grossly intact bilaterally.   Musculoskeletal Exam: Sharp pain with palpation of the third interspace and lateral compression of the metatarsal heads consistent with neuroma.  Positive Conley Canal sign with loadbearing of the forefoot. There is some minimal discomfort noted to the fourth interdigital space during palpation and lateral compression of the metatarsal heads, however this appears to be more symptomatically to the third interspace  Assessment: 1. Morton's neuroma third interspace right foot   Plan of Care:  1. Patient was evaluated. 2. Injection of 0.5 mL Celestone Soluspan injected into the third interspace right foot. 3. Continue using custom orthotics.  4. Recommended good shoe gear.  5. Return to clinic as needed.    Edrick Kins, DPM [facility]  Dr. Edrick Kins, [address]                                        [place], [place] [address]                  Office [phone]  Fax [phone]

## 2018-01-19 ENCOUNTER — Ambulatory Visit: Payer: BLUE CROSS/BLUE SHIELD | Admitting: Orthotics

## 2018-01-19 DIAGNOSIS — G5761 Lesion of plantar nerve, right lower limb: Secondary | ICD-10-CM

## 2018-01-19 DIAGNOSIS — M79671 Pain in right foot: Secondary | ICD-10-CM

## 2018-01-19 NOTE — Progress Notes (Signed)
Placed neuroma pad in ideal spot, she will wear f/o with pad, then have top cover glued down.

## 2018-01-28 ENCOUNTER — Other Ambulatory Visit: Payer: Self-pay

## 2018-01-28 ENCOUNTER — Telehealth: Payer: Self-pay | Admitting: Family Medicine

## 2018-01-28 ENCOUNTER — Encounter: Payer: Self-pay | Admitting: Family Medicine

## 2018-01-28 ENCOUNTER — Ambulatory Visit: Payer: BLUE CROSS/BLUE SHIELD | Admitting: Family Medicine

## 2018-01-28 VITALS — BP 109/77 | HR 77 | Temp 98.7°F | Ht 62.0 in | Wt 226.0 lb

## 2018-01-28 DIAGNOSIS — E669 Obesity, unspecified: Secondary | ICD-10-CM | POA: Diagnosis not present

## 2018-01-28 DIAGNOSIS — L700 Acne vulgaris: Secondary | ICD-10-CM | POA: Diagnosis not present

## 2018-01-28 DIAGNOSIS — M79604 Pain in right leg: Secondary | ICD-10-CM

## 2018-01-28 MED ORDER — NAPROXEN 500 MG PO TABS: 500.0000 mg | ORAL_TABLET | Freq: Two times a day (BID) | ORAL | 3 refills | Status: DC

## 2018-01-28 MED ORDER — CYCLOBENZAPRINE HCL 10 MG PO TABS: 10.0000 mg | ORAL_TABLET | Freq: Every day | ORAL | 3 refills | Status: DC

## 2018-01-28 MED ORDER — LIRAGLUTIDE -WEIGHT MANAGEMENT 18 MG/3ML ~~LOC~~ SOPN: PEN_INJECTOR | SUBCUTANEOUS | 6 refills | Status: DC

## 2018-01-28 MED ORDER — LIRAGLUTIDE -WEIGHT MANAGEMENT 18 MG/3ML ~~LOC~~ SOPN: PEN_INJECTOR | SUBCUTANEOUS | 3 refills | Status: DC

## 2018-01-28 NOTE — Patient Instructions (Signed)

## 2018-01-28 NOTE — Telephone Encounter (Signed)
New Rx printed to be faxed as cannot be e-rx'd please have the pharmacy send the PA.

## 2018-01-28 NOTE — Progress Notes (Signed)
BP 109/77   Pulse 77   Temp 98.7 F (37.1 C) (Oral)   Ht 5' 2"  (1.575 m)   Wt 226 lb (102.5 kg)   SpO2 98%   BMI 41.34 kg/m    Subjective:    Patient ID: Cindy Gilbert, female    DOB: [birth year], 42 y.o.   MRN: [id number]  HPI: Cindy Gilbert is a 42 y.o. female  Chief Complaint  Patient presents with  . Leg Pain    right leg for about 4 months ago off and on  . Abdominal Pain    upper left side   . Weight Check    pt has concerns about her weight   LEG Pain Duration: 4 months- coming and going Pain: yes Severity: 5/10  Quality:  Dull aching pain Location: from the R hip down through her knee into the bottom of her foot  Bilateral:  no Onset: sudden Frequency: multiple times a week, usually with driving Time of  day:   at random Sudden unintentional leg jerking:   no Paresthesias:   no Decreased sensation:  no Weakness:   no Insomnia:   yes Fatigue:   yes Back pain: yes  WEIGHT GAIN Duration: chronic Previous attempts at weight loss: yes, wellbutrin, belviq, weight watchers, diet and exercise. Complications of obesity:  Peak weight: current Weight loss goal: to be healthy Weight loss to date: none Requesting obesity pharmacotherapy: yes Current weight loss supplements/medications: no Previous weight loss supplements/meds: yes  Relevant past medical, surgical, family and social history reviewed and updated as indicated. Interim medical history since our last visit reviewed. Allergies and medications reviewed and updated.  Review of Systems  Constitutional: Negative.   Respiratory: Negative.   Cardiovascular: Negative.   Musculoskeletal: Positive for back pain and myalgias. Negative for arthralgias, gait problem, joint swelling, neck pain and neck stiffness.  Skin: Negative.   Neurological: Negative.   Psychiatric/Behavioral: Negative.     Per HPI unless specifically indicated above     Objective:    BP 109/77   Pulse 77   Temp 98.7  F (37.1 C) (Oral)   Ht 5' 2"  (1.575 m)   Wt 226 lb (102.5 kg)   SpO2 98%   BMI 41.34 kg/m   Wt Readings from Last 3 Encounters:  01/28/18 226 lb (102.5 kg)  08/19/17 217 lb 4 oz (98.5 kg)  12/18/16 217 lb 5 oz (98.6 kg)    Physical Exam  Constitutional: She is oriented to person, place, and time. She appears well-developed and well-nourished. No distress.  HENT:  Head: Normocephalic and atraumatic.  Right Ear: Hearing normal.  Left Ear: Hearing normal.  Nose: Nose normal.  Eyes: Conjunctivae and lids are normal. Right eye exhibits no discharge. Left eye exhibits no discharge. No scleral icterus.  Cardiovascular: Normal rate, regular rhythm, normal heart sounds and intact distal pulses. Exam reveals no gallop and no friction rub.  No murmur heard. Pulmonary/Chest: Effort normal and breath sounds normal. No stridor. No respiratory distress. She has no wheezes. She has no rhonchi. She has no rales. She exhibits no tenderness.  Musculoskeletal: Normal range of motion.  Neurological: She is alert and oriented to person, place, and time.  Skin: Skin is warm, dry and intact. No rash noted. She is not diaphoretic. No cyanosis or erythema. No pallor.  Psychiatric: She has a normal mood and affect. Her speech is normal and behavior is normal. Judgment and thought content normal. Cognition and memory are normal.  Nursing  note and vitals reviewed. Back Exam:    Inspection:  Normal spinal curvature.  No deformity, ecchymosis, erythema, or lesions     Palpation:     Midline spinal tenderness: no      Paralumbar tenderness: no      Parathoracic tenderness: no      Buttocks tenderness: no     Range of Motion:      Flexion: Fingers to Knees     Extension:Decreased     Lateral bending:Decreased    Rotation:Decreased    Neuro Exam:Lower extremity DTRs normal & symmetric.  Strength and sensation intact.    Special Tests:      Straight leg raise:negative   Results for orders placed or  performed in visit on 12/18/16  VITAMIN D 25 Hydroxy (Vit-D Deficiency, Fractures)  Result Value Ref Range   Vit D, 25-Hydroxy 22.2 (L) 30.0 - 100.0 ng/mL  Estradiol  Result Value Ref Range   Estradiol 110.6 pg/mL  Testosterone, free, total  Result Value Ref Range   Testosterone 27 8 - 48 ng/dL   Testosterone, Free 1.4 0.0 - 4.2 pg/mL   Sex Hormone Binding 51.9 24.6 - 122.0 nmol/L  FSH  Result Value Ref Range   FSH 30.4 mIU/mL  LH  Result Value Ref Range   LH 71.2 mIU/mL  Prolactin  Result Value Ref Range   Prolactin 11.5 4.8 - 23.3 ng/mL  Thyroid Panel With TSH  Result Value Ref Range   TSH 1.160 0.450 - 4.500 uIU/mL   T4, Total 6.3 4.5 - 12.0 ug/dL   T3 Uptake Ratio 26 24 - 39 %   Free Thyroxine Index 1.6 1.2 - 4.9      Assessment & Plan:   Problem List Items Addressed This Visit      Other   Obesity (BMI 35.0-39.9 without comorbidity) (Chronic)    Will treat with saxenda. Has failed belviq and wellbutrin and recheck 1 month.       Relevant Medications   Liraglutide -Weight Management (SAXENDA) 18 MG/3ML SOPN    Other Visit Diagnoses    Pain of right lower extremity    -  Primary   Following a dermatomal pattern. Will start flexeril, naproxen, exercises and PT and follow up 1 month.    Relevant Orders   Ambulatory referral to Physical Therapy       Follow up plan: Return in about 4 weeks (around 02/25/2018).

## 2018-01-28 NOTE — Telephone Encounter (Signed)
Copied from [place] [id number]. Topic: Quick Communication - See Telephone Encounter >> Jan 28, 2018  3:03 PM Rutherford Nail, NT wrote: CRM for notification. See Telephone encounter for: 01/28/18. Anna with [facility] calling and states that the Liraglutide -Weight Management ([place]) 18 MG/3ML SOPN is not covered by insurance and instructions need to be in MG, not mL. Please advise. CB#: [phone]

## 2018-01-28 NOTE — Assessment & Plan Note (Signed)
Will treat with saxenda. Has failed belviq and wellbutrin and recheck 1 month.

## 2018-01-31 NOTE — Telephone Encounter (Signed)
Prior authorization for Saxenda was initiated via [email or web address]. Key: [id number]  Will forward to front desk staff to ensure that RX was faxed to pharmacy

## 2018-01-31 NOTE — Telephone Encounter (Signed)
Called RX in and left VM for [facility].

## 2018-02-01 ENCOUNTER — Ambulatory Visit: Payer: Self-pay

## 2018-02-01 NOTE — Telephone Encounter (Signed)
Message from [facility] sent at 02/01/2018 4:08 PM EST   Sam with [employer] stated she needs clarification on the medical necessity for Liraglutide -Weight Management ([place]) 18 MG/3ML SOPN. Cb# [phone]

## 2018-02-02 DIAGNOSIS — M25551 Pain in right hip: Secondary | ICD-10-CM | POA: Diagnosis not present

## 2018-02-02 DIAGNOSIS — M6281 Muscle weakness (generalized): Secondary | ICD-10-CM | POA: Diagnosis not present

## 2018-02-02 DIAGNOSIS — M545 Low back pain: Secondary | ICD-10-CM | POA: Diagnosis not present

## 2018-02-02 DIAGNOSIS — M25651 Stiffness of right hip, not elsewhere classified: Secondary | ICD-10-CM | POA: Diagnosis not present

## 2018-02-02 NOTE — Telephone Encounter (Signed)
Called and spoke with Sam. Relayed information about patient trying and failing Belviq and Wellbutrin.

## 2018-02-04 ENCOUNTER — Encounter: Payer: Self-pay | Admitting: Family Medicine

## 2018-02-04 DIAGNOSIS — M6281 Muscle weakness (generalized): Secondary | ICD-10-CM | POA: Diagnosis not present

## 2018-02-04 DIAGNOSIS — M25551 Pain in right hip: Secondary | ICD-10-CM | POA: Diagnosis not present

## 2018-02-04 DIAGNOSIS — M545 Low back pain: Secondary | ICD-10-CM | POA: Diagnosis not present

## 2018-02-04 DIAGNOSIS — M25651 Stiffness of right hip, not elsewhere classified: Secondary | ICD-10-CM | POA: Diagnosis not present

## 2018-02-07 DIAGNOSIS — M25551 Pain in right hip: Secondary | ICD-10-CM | POA: Diagnosis not present

## 2018-02-07 DIAGNOSIS — M545 Low back pain: Secondary | ICD-10-CM | POA: Diagnosis not present

## 2018-02-07 DIAGNOSIS — M6281 Muscle weakness (generalized): Secondary | ICD-10-CM | POA: Diagnosis not present

## 2018-02-07 DIAGNOSIS — M25651 Stiffness of right hip, not elsewhere classified: Secondary | ICD-10-CM | POA: Diagnosis not present

## 2018-02-07 NOTE — Telephone Encounter (Signed)
Pt's saxenda was denied. We have not yet gotten the fax with the explaination as to why. Just FYI

## 2018-02-11 DIAGNOSIS — M6281 Muscle weakness (generalized): Secondary | ICD-10-CM | POA: Diagnosis not present

## 2018-02-11 DIAGNOSIS — M25651 Stiffness of right hip, not elsewhere classified: Secondary | ICD-10-CM | POA: Diagnosis not present

## 2018-02-11 DIAGNOSIS — M25551 Pain in right hip: Secondary | ICD-10-CM | POA: Diagnosis not present

## 2018-02-11 DIAGNOSIS — M545 Low back pain: Secondary | ICD-10-CM | POA: Diagnosis not present

## 2018-02-21 ENCOUNTER — Encounter: Payer: Self-pay | Admitting: Family Medicine

## 2018-02-25 DIAGNOSIS — M545 Low back pain: Secondary | ICD-10-CM | POA: Diagnosis not present

## 2018-02-25 DIAGNOSIS — M25651 Stiffness of right hip, not elsewhere classified: Secondary | ICD-10-CM | POA: Diagnosis not present

## 2018-02-25 DIAGNOSIS — M25551 Pain in right hip: Secondary | ICD-10-CM | POA: Diagnosis not present

## 2018-02-25 DIAGNOSIS — M6281 Muscle weakness (generalized): Secondary | ICD-10-CM | POA: Diagnosis not present

## 2018-02-28 DIAGNOSIS — M545 Low back pain: Secondary | ICD-10-CM | POA: Diagnosis not present

## 2018-02-28 DIAGNOSIS — M25551 Pain in right hip: Secondary | ICD-10-CM | POA: Diagnosis not present

## 2018-02-28 DIAGNOSIS — M25651 Stiffness of right hip, not elsewhere classified: Secondary | ICD-10-CM | POA: Diagnosis not present

## 2018-02-28 DIAGNOSIS — M6281 Muscle weakness (generalized): Secondary | ICD-10-CM | POA: Diagnosis not present

## 2018-03-01 ENCOUNTER — Other Ambulatory Visit: Payer: Self-pay | Admitting: Family Medicine

## 2018-03-01 NOTE — Telephone Encounter (Signed)
Requested medication (s) are due for refill today:yes to both  Requested medication (s) are on the active medication list:  the Vitamin D2 in not on the active med list                                                                                 Alprazolam is on the active med list   Last refill:  Vitamin D2  06/27/17   Alprazolam  12/18/16  Future visit scheduled:  Notes to clinic:  yes   Requested Prescriptions  Pending Prescriptions Disp Refills   Vitamin D, Ergocalciferol, (DRISDOL) 1.25 MG (50000 UT) CAPS capsule [Pharmacy Med Name: VITAMIN D2 1.25MG(50,000 UNIT)] 6 capsule 1    Sig: TAKE 1 CAPSULE (50,000 UNITS TOTAL) BY MOUTH EVERY 14 (FOURTEEN) DAYS.     Endocrinology:  Vitamins - Vitamin D Supplementation Failed - 03/01/2018  4:33 AM      Failed - 50,000 IU strengths are not delegated      Failed - Ca in normal range and within 360 days    Calcium  Date Value Ref Range Status  09/22/2016 9.2 8.7 - 10.2 mg/dL Final         Failed - Phosphate in normal range and within 360 days    No results found for: PHOS       Failed - Vit D in normal range and within 360 days    Vit D, 25-Hydroxy  Date Value Ref Range Status  12/18/2016 22.2 (L) 30.0 - 100.0 ng/mL Final    Comment:    Vitamin D deficiency has been defined by the Institute of Medicine and an Endocrine Society practice guideline as a level of serum 25-OH vitamin D less than 20 ng/mL (1,2). The Endocrine Society went on to further define vitamin D insufficiency as a level between 21 and 29 ng/mL (2). 1. IOM (Institute of Medicine). 2010. Dietary reference    intakes for calcium and D. [place]: The    [employer]. 2. Holick MF, Binkley [place], Bischoff-Ferrari HA, et al.    Evaluation, treatment, and prevention of vitamin D    deficiency: an Endocrine Society clinical practice    guideline. JCEM. 2011 Jul; 96(7):1911-30.          Passed - Valid encounter within last 12 months    Recent  Outpatient Visits          1 month ago Pain of right lower extremity   [place], Megan P, DO   6 months ago Allergic conjunctivitis of both eyes   [place], [place], DO   1 year ago Routine general medical examination at a health care facility   [facility], Barb Merino, DO   1 year ago Vitamin D deficiency   [place], Barb Merino, DO      Future Appointments            In 3 days Wynetta Emery, Barb Merino, DO [place]          ALPRAZolam Duanne Moron) 0.5 MG tablet [Pharmacy Med Name: ALPRAZOLAM 0.5 MG TABLET] 60 tablet     Sig:  TAKE ONE HALF-2 TABLETS BY MOUTH TWICE DAILY AS NEEDED FOR ANXIETY     Not Delegated - Psychiatry:  Anxiolytics/Hypnotics Failed - 03/01/2018  4:33 AM      Failed - This refill cannot be delegated      Failed - Urine Drug Screen completed in last 360 days.      Passed - Valid encounter within last 6 months    Recent Outpatient Visits          1 month ago Pain of right lower extremity   [place], Megan P, DO   6 months ago Allergic conjunctivitis of both eyes   [place], [place], DO   1 year ago Routine general medical examination at a health care facility   [facility], Barb Merino, DO   1 year ago Vitamin D deficiency   [facility] [name], DO      Future Appointments            In 3 days Wynetta Emery, Barb Merino, DO [facility], [facility]

## 2018-03-04 ENCOUNTER — Ambulatory Visit: Payer: BLUE CROSS/BLUE SHIELD | Admitting: Family Medicine

## 2018-03-04 NOTE — Telephone Encounter (Signed)
Could admin fit this patient in?

## 2018-03-04 NOTE — Telephone Encounter (Signed)
Needs follow up appointment.

## 2018-03-04 NOTE — Telephone Encounter (Signed)
Tried to reach patient but could not leave a VM due to mailbox being full

## 2018-03-07 ENCOUNTER — Encounter: Payer: Self-pay | Admitting: Family Medicine

## 2018-03-07 NOTE — Telephone Encounter (Signed)
LVM for pt to call back, also printed letter to mail

## 2018-03-10 NOTE — Telephone Encounter (Signed)
Patient is calling back and states she can not get to [facility] this week but would like to know if the form can be faxed to her at [phone] and she fax it back.

## 2018-03-11 DIAGNOSIS — M545 Low back pain: Secondary | ICD-10-CM | POA: Diagnosis not present

## 2018-03-11 DIAGNOSIS — Z6841 Body Mass Index (BMI) 40.0 and over, adult: Secondary | ICD-10-CM | POA: Diagnosis not present

## 2018-03-11 DIAGNOSIS — M25651 Stiffness of right hip, not elsewhere classified: Secondary | ICD-10-CM | POA: Diagnosis not present

## 2018-03-11 DIAGNOSIS — E559 Vitamin D deficiency, unspecified: Secondary | ICD-10-CM | POA: Diagnosis not present

## 2018-03-11 DIAGNOSIS — M25551 Pain in right hip: Secondary | ICD-10-CM | POA: Diagnosis not present

## 2018-03-11 DIAGNOSIS — M6281 Muscle weakness (generalized): Secondary | ICD-10-CM | POA: Diagnosis not present

## 2018-03-15 DIAGNOSIS — M25651 Stiffness of right hip, not elsewhere classified: Secondary | ICD-10-CM | POA: Diagnosis not present

## 2018-03-15 DIAGNOSIS — M25551 Pain in right hip: Secondary | ICD-10-CM | POA: Diagnosis not present

## 2018-03-15 DIAGNOSIS — M6281 Muscle weakness (generalized): Secondary | ICD-10-CM | POA: Diagnosis not present

## 2018-03-15 DIAGNOSIS — M545 Low back pain: Secondary | ICD-10-CM | POA: Diagnosis not present

## 2018-03-18 DIAGNOSIS — F331 Major depressive disorder, recurrent, moderate: Secondary | ICD-10-CM | POA: Diagnosis not present

## 2018-03-18 DIAGNOSIS — M25651 Stiffness of right hip, not elsewhere classified: Secondary | ICD-10-CM | POA: Diagnosis not present

## 2018-03-18 DIAGNOSIS — M6281 Muscle weakness (generalized): Secondary | ICD-10-CM | POA: Diagnosis not present

## 2018-03-18 DIAGNOSIS — M545 Low back pain: Secondary | ICD-10-CM | POA: Diagnosis not present

## 2018-03-18 DIAGNOSIS — M25551 Pain in right hip: Secondary | ICD-10-CM | POA: Diagnosis not present

## 2018-03-21 DIAGNOSIS — F331 Major depressive disorder, recurrent, moderate: Secondary | ICD-10-CM | POA: Diagnosis not present

## 2018-03-22 ENCOUNTER — Other Ambulatory Visit: Payer: Self-pay

## 2018-03-22 ENCOUNTER — Ambulatory Visit (INDEPENDENT_AMBULATORY_CARE_PROVIDER_SITE_OTHER): Payer: BLUE CROSS/BLUE SHIELD | Admitting: Family Medicine

## 2018-03-22 ENCOUNTER — Encounter: Payer: Self-pay | Admitting: Family Medicine

## 2018-03-22 VITALS — BP 117/83 | HR 74 | Temp 98.4°F | Ht 62.0 in | Wt 224.0 lb

## 2018-03-22 DIAGNOSIS — F419 Anxiety disorder, unspecified: Secondary | ICD-10-CM

## 2018-03-22 DIAGNOSIS — G4700 Insomnia, unspecified: Secondary | ICD-10-CM

## 2018-03-22 HISTORY — DX: Anxiety disorder, unspecified: F41.9

## 2018-03-22 MED ORDER — TRAZODONE HCL 50 MG PO TABS: 25.0000 mg | ORAL_TABLET | Freq: Every evening | ORAL | 3 refills | Status: DC | PRN

## 2018-03-22 MED ORDER — ALPRAZOLAM 0.5 MG PO TABS: 0.2500 mg | ORAL_TABLET | Freq: Two times a day (BID) | ORAL | 1 refills | Status: DC | PRN

## 2018-03-22 MED ORDER — BUSPIRONE HCL 5 MG PO TABS: 5.0000 mg | ORAL_TABLET | Freq: Three times a day (TID) | ORAL | 3 refills | Status: DC

## 2018-03-22 NOTE — Assessment & Plan Note (Signed)
Not doing great. Discussed weekly prozac, but would like to hold off on starting that. Will start buspar PRN. Continue PRN xanax. Call with any concerns.

## 2018-03-22 NOTE — Progress Notes (Signed)
BP 117/83   Pulse 74   Temp 98.4 F (36.9 C) (Oral)   Ht 5' 2"  (1.575 m)   Wt 224 lb (101.6 kg)   SpO2 99%   BMI 40.97 kg/m    Subjective:    Patient ID: Cindy Gilbert, female    DOB: 11/19/1975, 43 y.o.   MRN: [id number]  HPI: Cindy Gilbert is a 43 y.o. female  Chief Complaint  Patient presents with  . Anxiety    alprazolam refill. pt states she has been out of alprazolam for couple of weeks   ANXIETY/STRESS- using very appropriately, last Rx was for 60pills with 1 refill and was given in Oct 2018- ran out a couple of weeks ago Duration: Chronic Status:exacerbated Anxious mood: yes  Excessive worrying: yes Irritability: yes  Sweating: no Nausea: no Palpitations:no Hyperventilation: no Panic attacks: yes Agoraphobia: no  Obscessions/compulsions: no Depressed mood: yes Depression screen [facility] 2/9 03/22/2018 12/18/2016  Decreased Interest 0 0  Down, Depressed, Hopeless 1 0  PHQ - 2 Score 1 0  Altered sleeping 2 3  Tired, decreased energy 0 0  Change in appetite 0 0  Feeling bad or failure about yourself  1 1  Trouble concentrating 0 0  Moving slowly or fidgety/restless 0 0  Suicidal thoughts 0 0  PHQ-9 Score 4 4  Difficult doing work/chores Somewhat difficult Somewhat difficult   Anhedonia: no Weight changes: no Insomnia: yes staying asleep  Hypersomnia: no Fatigue/loss of energy: yes Feelings of worthlessness: no Feelings of guilt: no Impaired concentration/indecisiveness: no Suicidal ideations: no  Crying spells: no Recent Stressors/Life Changes: no   Relationship problems: no   Family stress: no     Financial stress: no    Job stress: no    Recent death/loss: no  Relevant past medical, surgical, family and social history reviewed and updated as indicated. Interim medical history since our last visit reviewed. Allergies and medications reviewed and updated.  Review of Systems  Constitutional: Negative.   Respiratory: Negative.     Cardiovascular: Negative.   Psychiatric/Behavioral: Negative.     Per HPI unless specifically indicated above     Objective:    BP 117/83   Pulse 74   Temp 98.4 F (36.9 C) (Oral)   Ht 5' 2"  (1.575 m)   Wt 224 lb (101.6 kg)   SpO2 99%   BMI 40.97 kg/m   Wt Readings from Last 3 Encounters:  03/22/18 224 lb (101.6 kg)  01/28/18 226 lb (102.5 kg)  08/19/17 217 lb 4 oz (98.5 kg)    Physical Exam Constitutional:      General: She is not in acute distress.    Appearance: She is well-developed.  HENT:     Head: Normocephalic and atraumatic.     Right Ear: Hearing normal.     Left Ear: Hearing normal.     Nose: Nose normal.  Eyes:     General: Lids are normal. No scleral icterus.       Right eye: No discharge.        Left eye: No discharge.     Conjunctiva/sclera: Conjunctivae normal.  Pulmonary:     Effort: Pulmonary effort is normal. No respiratory distress.  Musculoskeletal: Normal range of motion.  Skin:    Findings: No rash.  Neurological:     Mental Status: She is alert and oriented to person, place, and time.  Psychiatric:        Mood and Affect: Mood is anxious.  Speech: Speech normal.        Behavior: Behavior normal.        Thought Content: Thought content normal.        Judgment: Judgment normal.     Results for orders placed or performed in visit on 12/18/16  VITAMIN D 25 Hydroxy (Vit-D Deficiency, Fractures)  Result Value Ref Range   Vit D, 25-Hydroxy 22.2 (L) 30.0 - 100.0 ng/mL  Estradiol  Result Value Ref Range   Estradiol 110.6 pg/mL  Testosterone, free, total  Result Value Ref Range   Testosterone 27 8 - 48 ng/dL   Testosterone, Free 1.4 0.0 - 4.2 pg/mL   Sex Hormone Binding 51.9 24.6 - 122.0 nmol/L  FSH  Result Value Ref Range   FSH 30.4 mIU/mL  LH  Result Value Ref Range   LH 71.2 mIU/mL  Prolactin  Result Value Ref Range   Prolactin 11.5 4.8 - 23.3 ng/mL  Thyroid Panel With TSH  Result Value Ref Range   TSH 1.160 0.450 -  4.500 uIU/mL   T4, Total 6.3 4.5 - 12.0 ug/dL   T3 Uptake Ratio 26 24 - 39 %   Free Thyroxine Index 1.6 1.2 - 4.9      Assessment & Plan:   Problem List Items Addressed This Visit      Other   Insomnia    Will start trazodone and see how she's doing. Continue to monitor. If doesn't tolerate, will start belsomra.       Anxiety - Primary    Not doing great. Discussed weekly prozac, but would like to hold off on starting that. Will start buspar PRN. Continue PRN xanax. Call with any concerns.       Relevant Medications   busPIRone (BUSPAR) 5 MG tablet   traZODone (DESYREL) 50 MG tablet   ALPRAZolam (XANAX) 0.5 MG tablet       Follow up plan: Return in about 3 months (around 06/21/2018) for follow up anxiety.

## 2018-03-22 NOTE — Assessment & Plan Note (Signed)
Will start trazodone and see how she's doing. Continue to monitor. If doesn't tolerate, will start belsomra.

## 2018-03-31 DIAGNOSIS — Z713 Dietary counseling and surveillance: Secondary | ICD-10-CM | POA: Diagnosis not present

## 2018-03-31 DIAGNOSIS — Z6841 Body Mass Index (BMI) 40.0 and over, adult: Secondary | ICD-10-CM | POA: Diagnosis not present

## 2018-04-01 DIAGNOSIS — F331 Major depressive disorder, recurrent, moderate: Secondary | ICD-10-CM | POA: Diagnosis not present

## 2018-04-15 DIAGNOSIS — F331 Major depressive disorder, recurrent, moderate: Secondary | ICD-10-CM | POA: Diagnosis not present

## 2018-04-29 DIAGNOSIS — F331 Major depressive disorder, recurrent, moderate: Secondary | ICD-10-CM | POA: Diagnosis not present

## 2018-05-13 DIAGNOSIS — F331 Major depressive disorder, recurrent, moderate: Secondary | ICD-10-CM | POA: Diagnosis not present

## 2018-05-20 DIAGNOSIS — E559 Vitamin D deficiency, unspecified: Secondary | ICD-10-CM | POA: Diagnosis not present

## 2018-05-20 DIAGNOSIS — E669 Obesity, unspecified: Secondary | ICD-10-CM | POA: Diagnosis not present

## 2018-05-20 DIAGNOSIS — Z6838 Body mass index (BMI) 38.0-38.9, adult: Secondary | ICD-10-CM | POA: Diagnosis not present

## 2018-06-09 ENCOUNTER — Encounter: Payer: Self-pay | Admitting: Family Medicine

## 2018-06-10 MED ORDER — FLUTICASONE PROPIONATE 50 MCG/ACT NA SUSP: 2.0000 | Freq: Every day | NASAL | 12 refills | Status: DC

## 2018-06-10 MED ORDER — VITAMIN D (ERGOCALCIFEROL) 1.25 MG (50000 UNIT) PO CAPS: 50000.0000 [IU] | ORAL_CAPSULE | ORAL | 0 refills | Status: DC

## 2018-06-23 ENCOUNTER — Ambulatory Visit (INDEPENDENT_AMBULATORY_CARE_PROVIDER_SITE_OTHER): Payer: BLUE CROSS/BLUE SHIELD | Admitting: Family Medicine

## 2018-06-23 ENCOUNTER — Ambulatory Visit: Payer: BLUE CROSS/BLUE SHIELD | Admitting: Family Medicine

## 2018-06-23 ENCOUNTER — Encounter: Payer: Self-pay | Admitting: Family Medicine

## 2018-06-23 ENCOUNTER — Other Ambulatory Visit: Payer: Self-pay

## 2018-06-23 VITALS — BP 118/92 | HR 81 | Temp 98.3°F | Wt 203.6 lb

## 2018-06-23 DIAGNOSIS — G510 Bell's palsy: Secondary | ICD-10-CM | POA: Diagnosis not present

## 2018-06-23 MED ORDER — VALACYCLOVIR HCL 1 G PO TABS: 1000.0000 mg | ORAL_TABLET | Freq: Two times a day (BID) | ORAL | 0 refills | Status: DC

## 2018-06-23 NOTE — Progress Notes (Signed)
BP (!) 118/92    Pulse 81    Temp 98.3 F (36.8 C) (Oral)    Wt 203 lb 9.6 oz (92.4 kg)    BMI 37.24 kg/m    Subjective:    Patient ID: Cindy Gilbert, female    DOB: 08-Sep-1975, 43 y.o.   MRN: [id number]  HPI: Cindy Gilbert is a 43 y.o. female  Chief Complaint  Patient presents with   Facial Swelling    Resolved now, lasted x 3 days. No new items that she can contribute to it. Did dye hair on Surnday.    Over the weekend started with swelling in her L cheek. She hadn't had any changes in her diet or her cosmetics. She does note that she had peanut butter, had some wine and dyed her hair. She noticed that her L side of her face seemed more puffy and saggy a little bit. It has been feeling otherwise normal. No numbness, no trouble with dry eye. She has noticed that it got a bit better over the past couple of days. She notes that she has been under a lot more stress recently. 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 She states that she is hanging in there OK, but has been under a lot more stress. She os otherwise doing well with no other concerns or complaints at this time.   Relevant past medical, surgical, family and social history reviewed and updated as indicated. Interim medical history since our last visit reviewed. Allergies and medications reviewed and updated.  Review of Systems  Constitutional: Negative.   Respiratory: Negative.   Cardiovascular: Negative.   Musculoskeletal: Negative.   Skin: Negative.   Neurological: Positive for facial asymmetry and weakness. Negative for dizziness, tremors, seizures, syncope, speech difficulty, light-headedness, numbness and headaches.  Psychiatric/Behavioral: Negative for agitation, behavioral problems, confusion, decreased concentration, dysphoric mood, hallucinations, self-injury, sleep disturbance and suicidal ideas. The patient is nervous/anxious. The patient is not  hyperactive.     Per HPI unless specifically indicated above     Objective:    BP (!) 118/92    Pulse 81    Temp 98.3 F (36.8 C) (Oral)    Wt 203 lb 9.6 oz (92.4 kg)    BMI 37.24 kg/m   Wt Readings from Last 3 Encounters:  06/23/18 203 lb 9.6 oz (92.4 kg)  03/22/18 224 lb (101.6 kg)  01/28/18 226 lb (102.5 kg)    Physical Exam Vitals signs and nursing note reviewed.  Constitutional:      General: She is not in acute distress.    Appearance: Normal appearance. She is not ill-appearing, toxic-appearing or diaphoretic.  HENT:     Head: Normocephalic and atraumatic.     Right Ear: External ear normal.     Left Ear: External ear normal.     Nose: Nose normal.     Mouth/Throat:     Mouth: Mucous membranes are moist.     Pharynx: Oropharynx is clear.  Eyes:     General: No scleral icterus.       Right eye: No discharge.        Left eye: No discharge.     Conjunctiva/sclera: Conjunctivae normal.     Pupils: Pupils are equal, round, and reactive to light.  Neck:     Musculoskeletal: Normal range of motion.  Pulmonary:     Effort: Pulmonary effort is normal. No respiratory distress.  Comments: Speaking in full sentences Musculoskeletal: Normal range of motion.  Skin:    Coloration: Skin is not jaundiced or pale.     Findings: No bruising, erythema, lesion or rash.  Neurological:     Mental Status: She is alert and oriented to person, place, and time. Mental status is at baseline.     Comments: L sided facial droop- mild  Psychiatric:        Mood and Affect: Mood normal.        Behavior: Behavior normal.        Thought Content: Thought content normal.        Judgment: Judgment normal.     Results for orders placed or performed in visit on 12/18/16  VITAMIN D 25 Hydroxy (Vit-D Deficiency, Fractures)  Result Value Ref Range   Vit D, 25-Hydroxy 22.2 (L) 30.0 - 100.0 ng/mL  Estradiol  Result Value Ref Range   Estradiol 110.6 pg/mL  Testosterone, free, total  Result  Value Ref Range   Testosterone 27 8 - 48 ng/dL   Testosterone, Free 1.4 0.0 - 4.2 pg/mL   Sex Hormone Binding 51.9 24.6 - 122.0 nmol/L  FSH  Result Value Ref Range   FSH 30.4 mIU/mL  LH  Result Value Ref Range   LH 71.2 mIU/mL  Prolactin  Result Value Ref Range   Prolactin 11.5 4.8 - 23.3 ng/mL  Thyroid Panel With TSH  Result Value Ref Range   TSH 1.160 0.450 - 4.500 uIU/mL   T4, Total 6.3 4.5 - 12.0 ug/dL   T3 Uptake Ratio 26 24 - 39 %   Free Thyroxine Index 1.6 1.2 - 4.9      Assessment & Plan:   Problem List Items Addressed This Visit    None    Visit Diagnoses    Bell's palsy    -  Primary   Will treat with valtrex in case of HSV. Will check labs to look for tick diseases. Continue to monitor. Call with any concerns.    Relevant Orders   Lyme Ab/Western Blot Reflex   Rocky mtn spotted fvr abs pnl(IgG+IgM)   Ehrlichia Antibody Panel   Babesia microti Antibody Panel   Comprehensive metabolic panel   CBC with Differential/Platelet       Follow up plan: Return 2 weeks, for follow up bells palsy.     This visit was completed via FaceTime due to the restrictions of the COVID-19 pandemic. All issues as above were discussed and addressed. Physical exam was done as above through visual confirmation on FaceTime. If it was felt that the patient should be evaluated in the office, they were directed there. The patient verbally consented to this visit.  Location of the patient: home  Location of the provider: home  Those involved with this call:   Provider: Park Liter, DO  CMA: Gerda Diss, CMA  Front Desk/Registration: Don Perking   Time spent on call: 25 minutes with patient face to face via video conference. More than 50% of this time was spent in counseling and coordination of care. 40 minutes total spent in review of patient's record and preparation of their chart.

## 2018-07-04 ENCOUNTER — Other Ambulatory Visit: Payer: BLUE CROSS/BLUE SHIELD

## 2018-07-04 ENCOUNTER — Other Ambulatory Visit: Payer: Self-pay

## 2018-07-04 DIAGNOSIS — G510 Bell's palsy: Secondary | ICD-10-CM | POA: Diagnosis not present

## 2018-07-07 ENCOUNTER — Ambulatory Visit: Payer: BLUE CROSS/BLUE SHIELD | Admitting: Family Medicine

## 2018-07-07 ENCOUNTER — Other Ambulatory Visit: Payer: Self-pay | Admitting: Family Medicine

## 2018-07-07 LAB — COMPREHENSIVE METABOLIC PANEL
ALT: 12 IU/L (ref 0–32)
AST: 10 IU/L (ref 0–40)
Albumin/Globulin Ratio: 1.8 (ref 1.2–2.2)
Albumin: 4.1 g/dL (ref 3.8–4.8)
Alkaline Phosphatase: 88 IU/L (ref 39–117)
BUN/Creatinine Ratio: 22 (ref 9–23)
BUN: 18 mg/dL (ref 6–24)
Bilirubin Total: 0.4 mg/dL (ref 0.0–1.2)
CO2: 22 mmol/L (ref 20–29)
Calcium: 9.2 mg/dL (ref 8.7–10.2)
Chloride: 105 mmol/L (ref 96–106)
Creatinine, Ser: 0.81 mg/dL (ref 0.57–1.00)
GFR calc Af Amer: 104 mL/min/{1.73_m2} (ref >59)
GFR calc non Af Amer: 90 mL/min/{1.73_m2} (ref >59)
Globulin, Total: 2.3 g/dL (ref 1.5–4.5)
Glucose: 86 mg/dL (ref 65–99)
Potassium: 4.3 mmol/L (ref 3.5–5.2)
Sodium: 139 mmol/L (ref 134–144)
Total Protein: 6.4 g/dL (ref 6.0–8.5)

## 2018-07-07 LAB — CBC WITH DIFFERENTIAL/PLATELET
Basophils Absolute: 0 10*3/uL (ref 0.0–0.2)
Basos: 1 %
EOS (ABSOLUTE): 0.1 10*3/uL (ref 0.0–0.4)
Eos: 2 %
Hematocrit: 41.6 % (ref 34.0–46.6)
Hemoglobin: 13.7 g/dL (ref 11.1–15.9)
Immature Grans (Abs): 0 10*3/uL (ref 0.0–0.1)
Immature Granulocytes: 0 %
Lymphocytes Absolute: 3.2 10*3/uL — ABNORMAL HIGH (ref 0.7–3.1)
Lymphs: 55 %
MCH: 27.2 pg (ref 26.6–33.0)
MCHC: 32.9 g/dL (ref 31.5–35.7)
MCV: 83 fL (ref 79–97)
Monocytes Absolute: 0.4 10*3/uL (ref 0.1–0.9)
Monocytes: 6 %
Neutrophils Absolute: 2.1 10*3/uL (ref 1.4–7.0)
Neutrophils: 36 %
Platelets: 234 10*3/uL (ref 150–450)
RBC: 5.04 x10E6/uL (ref 3.77–5.28)
RDW: 13.7 % (ref 11.7–15.4)
WBC: 5.8 10*3/uL (ref 3.4–10.8)

## 2018-07-07 LAB — ROCKY MTN SPOTTED FVR ABS PNL(IGG+IGM)
RMSF IgG: POSITIVE — AB
RMSF IgM: 0.29 index (ref 0.00–0.89)

## 2018-07-07 LAB — EHRLICHIA ANTIBODY PANEL
E. Chaffeensis (HME) IgM Titer: NEGATIVE
E.Chaffeensis (HME) IgG: NEGATIVE
HGE IgG Titer: NEGATIVE
HGE IgM Titer: NEGATIVE

## 2018-07-07 LAB — RMSF, IGG, IFA: RMSF, IGG, IFA: 1:128 {titer} — ABNORMAL HIGH

## 2018-07-07 LAB — BABESIA MICROTI ANTIBODY PANEL
Babesia microti IgG: <1:10 {titer}
Babesia microti IgM: <1:10 {titer}

## 2018-07-07 LAB — LYME AB/WESTERN BLOT REFLEX
LYME DISEASE AB, QUANT, IGM: <0.8 index (ref 0.00–0.79)
Lyme IgG/IgM Ab: <0.91 {ISR} (ref 0.00–0.90)

## 2018-07-07 MED ORDER — DOXYCYCLINE HYCLATE 100 MG PO TABS: 100.0000 mg | ORAL_TABLET | Freq: Two times a day (BID) | ORAL | 0 refills | Status: DC

## 2018-07-08 ENCOUNTER — Other Ambulatory Visit: Payer: Self-pay

## 2018-07-08 ENCOUNTER — Ambulatory Visit (INDEPENDENT_AMBULATORY_CARE_PROVIDER_SITE_OTHER): Payer: BLUE CROSS/BLUE SHIELD | Admitting: Family Medicine

## 2018-07-08 ENCOUNTER — Encounter: Payer: Self-pay | Admitting: Family Medicine

## 2018-07-08 VITALS — BP 122/92 | HR 80 | Temp 97.6°F | Wt 202.1 lb

## 2018-07-08 DIAGNOSIS — A770 Spotted fever due to Rickettsia rickettsii: Secondary | ICD-10-CM | POA: Diagnosis not present

## 2018-07-08 DIAGNOSIS — G510 Bell's palsy: Secondary | ICD-10-CM | POA: Diagnosis not present

## 2018-07-08 NOTE — Progress Notes (Signed)
BP (!) 122/92   Pulse 80   Temp 97.6 F (36.4 C)   Wt 202 lb 2 oz (91.7 kg)   BMI 36.97 kg/m    Subjective:    Patient ID: Cindy Gilbert, female    DOB: 11/24/1975, 43 y.o.   MRN: [id number]  HPI: Cindy Gilbert is a 43 y.o. female  Chief Complaint  Patient presents with  . Follow-up   Has been having a lot of pain from her lower back and hip region. Got worse with a new chair. Bloodwork just came back as + for RMSF. Has not started her doxycycline yet. She notes that her face has gotten a bit better. Drooping still there slightly, but much better. She is otherwise feeling well with no other concerns or complaints at this time.   Relevant past medical, surgical, family and social history reviewed and updated as indicated. Interim medical history since our last visit reviewed. Allergies and medications reviewed and updated.  Review of Systems  Constitutional: Negative.   Respiratory: Negative.   Cardiovascular: Negative.   Neurological: Positive for facial asymmetry. Negative for dizziness, tremors, seizures, syncope, speech difficulty, weakness, light-headedness, numbness and headaches.  Psychiatric/Behavioral: Negative.     Per HPI unless specifically indicated above     Objective:    BP (!) 122/92   Pulse 80   Temp 97.6 F (36.4 C)   Wt 202 lb 2 oz (91.7 kg)   BMI 36.97 kg/m   Wt Readings from Last 3 Encounters:  07/08/18 202 lb 2 oz (91.7 kg)  06/23/18 203 lb 9.6 oz (92.4 kg)  03/22/18 224 lb (101.6 kg)    Physical Exam Vitals signs and nursing note reviewed.  Constitutional:      General: She is not in acute distress.    Appearance: Normal appearance. She is not ill-appearing, toxic-appearing or diaphoretic.  HENT:     Head: Normocephalic and atraumatic.     Right Ear: External ear normal.     Left Ear: External ear normal.     Nose: Nose normal.     Mouth/Throat:     Mouth: Mucous membranes are moist.     Pharynx: Oropharynx is clear.   Eyes:     General: No scleral icterus.       Right eye: No discharge.        Left eye: No discharge.     Conjunctiva/sclera: Conjunctivae normal.     Pupils: Pupils are equal, round, and reactive to light.  Neck:     Musculoskeletal: Normal range of motion.  Pulmonary:     Effort: Pulmonary effort is normal. No respiratory distress.     Comments: Speaking in full sentences Musculoskeletal: Normal range of motion.  Skin:    Coloration: Skin is not jaundiced or pale.     Findings: No bruising, erythema, lesion or rash.  Neurological:     Mental Status: She is alert and oriented to person, place, and time. Mental status is at baseline.     Comments: Mild L sided facial droop- improved  Psychiatric:        Mood and Affect: Mood normal.        Behavior: Behavior normal.        Thought Content: Thought content normal.        Judgment: Judgment normal.     Results for orders placed or performed in visit on 07/04/18  CBC with Differential/Platelet  Result Value Ref Range   WBC 5.8 3.4 - 10.8  x10E3/uL   RBC 5.04 3.77 - 5.28 x10E6/uL   Hemoglobin 13.7 11.1 - 15.9 g/dL   Hematocrit 41.6 34.0 - 46.6 %   MCV 83 79 - 97 fL   MCH 27.2 26.6 - 33.0 pg   MCHC 32.9 31.5 - 35.7 g/dL   RDW 13.7 11.7 - 15.4 %   Platelets 234 150 - 450 x10E3/uL   Neutrophils 36 Not Estab. %   Lymphs 55 Not Estab. %   Monocytes 6 Not Estab. %   Eos 2 Not Estab. %   Basos 1 Not Estab. %   Neutrophils Absolute 2.1 1.4 - 7.0 x10E3/uL   Lymphocytes Absolute 3.2 (H) 0.7 - 3.1 x10E3/uL   Monocytes Absolute 0.4 0.1 - 0.9 x10E3/uL   EOS (ABSOLUTE) 0.1 0.0 - 0.4 x10E3/uL   Basophils Absolute 0.0 0.0 - 0.2 x10E3/uL   Immature Granulocytes 0 Not Estab. %   Immature Grans (Abs) 0.0 0.0 - 0.1 x10E3/uL  Comprehensive metabolic panel  Result Value Ref Range   Glucose 86 65 - 99 mg/dL   BUN 18 6 - 24 mg/dL   Creatinine, Ser 0.81 0.57 - 1.00 mg/dL   GFR calc non Af Amer 90 >59 mL/min/1.73   GFR calc Af Amer 104 >59  mL/min/1.73   BUN/Creatinine Ratio 22 9 - 23   Sodium 139 134 - 144 mmol/L   Potassium 4.3 3.5 - 5.2 mmol/L   Chloride 105 96 - 106 mmol/L   CO2 22 20 - 29 mmol/L   Calcium 9.2 8.7 - 10.2 mg/dL   Total Protein 6.4 6.0 - 8.5 g/dL   Albumin 4.1 3.8 - 4.8 g/dL   Globulin, Total 2.3 1.5 - 4.5 g/dL   Albumin/Globulin Ratio 1.8 1.2 - 2.2   Bilirubin Total 0.4 0.0 - 1.2 mg/dL   Alkaline Phosphatase 88 39 - 117 IU/L   AST 10 0 - 40 IU/L   ALT 12 0 - 32 IU/L  Babesia microti Antibody Panel  Result Value Ref Range   Babesia microti IgM <1:10 Neg:<1:10   Babesia microti IgG <1:76 HYW:<7:37  Ehrlichia Antibody Panel  Result Value Ref Range   E.Chaffeensis (HME) IgG Negative Neg:<1:64   E. Chaffeensis (HME) IgM Titer Negative Neg:<1:20   HGE IgG Titer Negative Neg:<1:64   HGE IgM Titer Negative Neg:<1:20  Rocky mtn spotted fvr abs pnl(IgG+IgM)  Result Value Ref Range   RMSF IgG Positive (A) Negative   RMSF IgM 0.29 0.00 - 0.89 index  Lyme Ab/Western Blot Reflex  Result Value Ref Range   Lyme IgG/IgM Ab <0.91 0.00 - 0.90 ISR   LYME DISEASE AB, QUANT, IGM <0.80 0.00 - 0.79 index  RMSF, IgG, IFA  Result Value Ref Range   RMSF, IGG, IFA 1:128 (H) Neg <1:64      Assessment & Plan:   Problem List Items Addressed This Visit    None    Visit Diagnoses    RMSF [facility] spotted fever)    -  Primary   Will start doxycycline. Continue to monitor. Call with any concerns.    Bell's palsy       Improving. Will treat RMSF. Call with any concerns.        Follow up plan: Return if symptoms worsen or fail to improve.    . This visit was completed via FaceTime due to the restrictions of the COVID-19 pandemic. All issues as above were discussed and addressed. Physical exam was done as above through visual confirmation on FaceTime.  If it was felt that the patient should be evaluated in the office, they were directed there. The patient verbally consented to this visit. . Location of the  patient: home . Location of the provider: home . Those involved with this call:  . Provider: Park Liter, DO . CMA: Tiffany Reel, CMA . Front Desk/Registration: Linard Millers  . Time spent on call: 15 minutes with patient face to face via video conference. More than 50% of this time was spent in counseling and coordination of care. 23 minutes total spent in review of patient's record and preparation of their chart.

## 2018-07-22 ENCOUNTER — Other Ambulatory Visit: Payer: Self-pay | Admitting: Family Medicine

## 2018-08-01 ENCOUNTER — Other Ambulatory Visit: Payer: Self-pay | Admitting: Family Medicine

## 2018-08-01 NOTE — Telephone Encounter (Signed)
Requested medication (s) are due for refill today: Yes  Requested medication (s) are on the active medication list: Yes  Last refill:  06/10/18  Future visit scheduled: No  Notes to clinic:  Unsure if provider wants to continue, unable to refill     Requested Prescriptions  Pending Prescriptions Disp Refills   Vitamin D, Ergocalciferol, (DRISDOL) 1.25 MG (50000 UT) CAPS capsule [Pharmacy Med Name: VITAMIN D2 1.25MG(50,000 UNIT)] 8 capsule 0    Sig: Take 1 capsule (50,000 Units total) by mouth every 7 (seven) days.     Endocrinology:  Vitamins - Vitamin D Supplementation Failed - 08/01/2018  1:17 AM      Failed - 50,000 IU strengths are not delegated      Failed - Phosphate in normal range and within 360 days    No results found for: PHOS       Failed - Vitamin D in normal range and within 360 days    Vit D, 25-Hydroxy  Date Value Ref Range Status  12/18/2016 22.2 (L) 30.0 - 100.0 ng/mL Final    Comment:    Vitamin D deficiency has been defined by the Institute of Medicine and an Endocrine Society practice guideline as a level of serum 25-OH vitamin D less than 20 ng/mL (1,2). The Endocrine Society went on to further define vitamin D insufficiency as a level between 21 and 29 ng/mL (2). 1. IOM (Institute of Medicine). 2010. Dietary reference    intakes for calcium and D. [place]: The    [employer]. 2. Holick MF, Binkley [place], Bischoff-Ferrari HA, et al.    Evaluation, treatment, and prevention of vitamin D    deficiency: an Endocrine Society clinical practice    guideline. JCEM. 2011 Jul; 96(7):1911-30.          Passed - Ca in normal range and within 360 days    Calcium  Date Value Ref Range Status  07/04/2018 9.2 8.7 - 10.2 mg/dL Final         Passed - Valid encounter within last 12 months    Recent Outpatient Visits          3 weeks ago RMSF [facility] spotted fever)   [place], [place], DO   1 month ago Bell's palsy    [place], [place], DO   4 months ago [place] P, DO   6 months ago Pain of right lower extremity   [place], Megan P, DO   11 months ago Allergic conjunctivitis of both eyes   [place], [place], DO

## 2018-09-14 ENCOUNTER — Encounter: Payer: Self-pay | Admitting: Family Medicine

## 2018-09-14 DIAGNOSIS — Z20822 Contact with and (suspected) exposure to covid-19: Secondary | ICD-10-CM

## 2018-09-14 DIAGNOSIS — Z20828 Contact with and (suspected) exposure to other viral communicable diseases: Secondary | ICD-10-CM

## 2018-09-15 ENCOUNTER — Other Ambulatory Visit: Payer: Self-pay

## 2018-09-15 DIAGNOSIS — Z20822 Contact with and (suspected) exposure to covid-19: Secondary | ICD-10-CM

## 2018-09-18 LAB — NOVEL CORONAVIRUS, NAA: SARS-CoV-2, NAA: NOT DETECTED

## 2018-09-19 ENCOUNTER — Telehealth: Payer: Self-pay | Admitting: Family Medicine

## 2018-09-19 NOTE — Telephone Encounter (Signed)
Called and left patient a VM letting her know result (DPR verified).

## 2018-09-19 NOTE — Telephone Encounter (Signed)
Please let her know that her COVID test was negative. Thanks!

## 2018-09-23 DIAGNOSIS — Z1231 Encounter for screening mammogram for malignant neoplasm of breast: Secondary | ICD-10-CM | POA: Diagnosis not present

## 2018-09-23 LAB — HM MAMMOGRAPHY

## 2018-12-15 DIAGNOSIS — M5431 Sciatica, right side: Secondary | ICD-10-CM | POA: Diagnosis not present

## 2018-12-15 DIAGNOSIS — M9905 Segmental and somatic dysfunction of pelvic region: Secondary | ICD-10-CM | POA: Diagnosis not present

## 2018-12-15 DIAGNOSIS — M9902 Segmental and somatic dysfunction of thoracic region: Secondary | ICD-10-CM | POA: Diagnosis not present

## 2018-12-15 DIAGNOSIS — M9903 Segmental and somatic dysfunction of lumbar region: Secondary | ICD-10-CM | POA: Diagnosis not present

## 2018-12-21 DIAGNOSIS — M9902 Segmental and somatic dysfunction of thoracic region: Secondary | ICD-10-CM | POA: Diagnosis not present

## 2018-12-21 DIAGNOSIS — M9903 Segmental and somatic dysfunction of lumbar region: Secondary | ICD-10-CM | POA: Diagnosis not present

## 2018-12-21 DIAGNOSIS — M9905 Segmental and somatic dysfunction of pelvic region: Secondary | ICD-10-CM | POA: Diagnosis not present

## 2018-12-21 DIAGNOSIS — M5431 Sciatica, right side: Secondary | ICD-10-CM | POA: Diagnosis not present

## 2018-12-28 DIAGNOSIS — M9902 Segmental and somatic dysfunction of thoracic region: Secondary | ICD-10-CM | POA: Diagnosis not present

## 2018-12-28 DIAGNOSIS — M5431 Sciatica, right side: Secondary | ICD-10-CM | POA: Diagnosis not present

## 2018-12-28 DIAGNOSIS — M9903 Segmental and somatic dysfunction of lumbar region: Secondary | ICD-10-CM | POA: Diagnosis not present

## 2018-12-28 DIAGNOSIS — M9905 Segmental and somatic dysfunction of pelvic region: Secondary | ICD-10-CM | POA: Diagnosis not present

## 2018-12-30 DIAGNOSIS — M9905 Segmental and somatic dysfunction of pelvic region: Secondary | ICD-10-CM | POA: Diagnosis not present

## 2018-12-30 DIAGNOSIS — M9902 Segmental and somatic dysfunction of thoracic region: Secondary | ICD-10-CM | POA: Diagnosis not present

## 2018-12-30 DIAGNOSIS — M5431 Sciatica, right side: Secondary | ICD-10-CM | POA: Diagnosis not present

## 2018-12-30 DIAGNOSIS — M9903 Segmental and somatic dysfunction of lumbar region: Secondary | ICD-10-CM | POA: Diagnosis not present

## 2019-01-03 DIAGNOSIS — M5431 Sciatica, right side: Secondary | ICD-10-CM | POA: Diagnosis not present

## 2019-01-03 DIAGNOSIS — M9902 Segmental and somatic dysfunction of thoracic region: Secondary | ICD-10-CM | POA: Diagnosis not present

## 2019-01-03 DIAGNOSIS — M9905 Segmental and somatic dysfunction of pelvic region: Secondary | ICD-10-CM | POA: Diagnosis not present

## 2019-01-03 DIAGNOSIS — M9903 Segmental and somatic dysfunction of lumbar region: Secondary | ICD-10-CM | POA: Diagnosis not present

## 2019-01-10 DIAGNOSIS — M9902 Segmental and somatic dysfunction of thoracic region: Secondary | ICD-10-CM | POA: Diagnosis not present

## 2019-01-10 DIAGNOSIS — M9903 Segmental and somatic dysfunction of lumbar region: Secondary | ICD-10-CM | POA: Diagnosis not present

## 2019-01-10 DIAGNOSIS — M9905 Segmental and somatic dysfunction of pelvic region: Secondary | ICD-10-CM | POA: Diagnosis not present

## 2019-01-10 DIAGNOSIS — M5431 Sciatica, right side: Secondary | ICD-10-CM | POA: Diagnosis not present

## 2019-01-12 DIAGNOSIS — M9903 Segmental and somatic dysfunction of lumbar region: Secondary | ICD-10-CM | POA: Diagnosis not present

## 2019-01-12 DIAGNOSIS — M62830 Muscle spasm of back: Secondary | ICD-10-CM | POA: Diagnosis not present

## 2019-01-12 DIAGNOSIS — M9905 Segmental and somatic dysfunction of pelvic region: Secondary | ICD-10-CM | POA: Diagnosis not present

## 2019-01-12 DIAGNOSIS — M9902 Segmental and somatic dysfunction of thoracic region: Secondary | ICD-10-CM | POA: Diagnosis not present

## 2019-01-15 ENCOUNTER — Other Ambulatory Visit: Payer: Self-pay | Admitting: Family Medicine

## 2019-01-16 NOTE — Telephone Encounter (Signed)
Please let her know we'll recheck when she comes back in.

## 2019-01-16 NOTE — Telephone Encounter (Signed)
Requested medication (s) are due for refill today: yes  Requested medication (s) are on the active medication list: yes  Last refill: 08/01/2018  Future visit scheduled: no  Notes to clinic: refill cannot be delegated    Requested Prescriptions  Pending Prescriptions Disp Refills   Vitamin D, Ergocalciferol, (DRISDOL) 1.25 MG (50000 UT) CAPS capsule [Pharmacy Med Name: VITAMIN D2 1.25MG(50,000 UNIT)] 8 capsule 0    Sig: TAKE 1 CAPSULE (50,000 UNITS TOTAL) BY MOUTH EVERY 7 (SEVEN) DAYS.     Endocrinology:  Vitamins - Vitamin D Supplementation Failed - 01/15/2019  1:15 PM      Failed - 50,000 IU strengths are not delegated      Failed - Phosphate in normal range and within 360 days    No results found for: PHOS       Failed - Vitamin D in normal range and within 360 days    Vit D, 25-Hydroxy  Date Value Ref Range Status  12/18/2016 22.2 (L) 30.0 - 100.0 ng/mL Final    Comment:    Vitamin D deficiency has been defined by the Institute of Medicine and an Endocrine Society practice guideline as a level of serum 25-OH vitamin D less than 20 ng/mL (1,2). The Endocrine Society went on to further define vitamin D insufficiency as a level between 21 and 29 ng/mL (2). 1. IOM (Institute of Medicine). 2010. Dietary reference    intakes for calcium and D. [place]: The    [employer]. 2. Holick MF, Binkley [place], Bischoff-Ferrari HA, et al.    Evaluation, treatment, and prevention of vitamin D    deficiency: an Endocrine Society clinical practice    guideline. JCEM. 2011 Jul; 96(7):1911-30.          Passed - Ca in normal range and within 360 days    Calcium  Date Value Ref Range Status  07/04/2018 9.2 8.7 - 10.2 mg/dL Final         Passed - Valid encounter within last 12 months    Recent Outpatient Visits          6 months ago RMSF [facility] spotted fever)   [place], Megan P, DO   6 months ago Bell's palsy   [place], [place], DO   10 months ago [place], [place], DO   11 months ago Pain of right lower extremity   [place], Megan P, DO   1 year ago Allergic conjunctivitis of both eyes   [place], [place], DO

## 2019-01-18 DIAGNOSIS — M9903 Segmental and somatic dysfunction of lumbar region: Secondary | ICD-10-CM | POA: Diagnosis not present

## 2019-01-18 DIAGNOSIS — M9905 Segmental and somatic dysfunction of pelvic region: Secondary | ICD-10-CM | POA: Diagnosis not present

## 2019-01-18 DIAGNOSIS — M62830 Muscle spasm of back: Secondary | ICD-10-CM | POA: Diagnosis not present

## 2019-01-18 DIAGNOSIS — M9902 Segmental and somatic dysfunction of thoracic region: Secondary | ICD-10-CM | POA: Diagnosis not present

## 2019-01-23 ENCOUNTER — Ambulatory Visit (INDEPENDENT_AMBULATORY_CARE_PROVIDER_SITE_OTHER): Payer: BC Managed Care – PPO | Admitting: Family Medicine

## 2019-01-23 ENCOUNTER — Other Ambulatory Visit: Payer: Self-pay

## 2019-01-23 ENCOUNTER — Encounter: Payer: Self-pay | Admitting: Family Medicine

## 2019-01-23 VITALS — BP 125/87 | HR 74 | Temp 98.7°F

## 2019-01-23 DIAGNOSIS — L750 Bromhidrosis: Secondary | ICD-10-CM

## 2019-01-23 DIAGNOSIS — F419 Anxiety disorder, unspecified: Secondary | ICD-10-CM

## 2019-01-23 DIAGNOSIS — G4700 Insomnia, unspecified: Secondary | ICD-10-CM

## 2019-01-23 DIAGNOSIS — Z0000 Encounter for general adult medical examination without abnormal findings: Secondary | ICD-10-CM

## 2019-01-23 DIAGNOSIS — E559 Vitamin D deficiency, unspecified: Secondary | ICD-10-CM

## 2019-01-23 DIAGNOSIS — B372 Candidiasis of skin and nail: Secondary | ICD-10-CM

## 2019-01-23 LAB — UA/M W/RFLX CULTURE, ROUTINE
Bilirubin, UA: NEGATIVE
Glucose, UA: NEGATIVE
Ketones, UA: NEGATIVE
Leukocytes,UA: NEGATIVE
Nitrite, UA: NEGATIVE
Protein,UA: NEGATIVE
RBC, UA: NEGATIVE
Specific Gravity, UA: 1.025 (ref 1.005–1.030)
Urobilinogen, Ur: 0.2 mg/dL (ref 0.2–1.0)
pH, UA: 5.5 (ref 5.0–7.5)

## 2019-01-23 MED ORDER — NYSTATIN 100000 UNIT/GM EX CREA: 1.0000 "application " | TOPICAL_CREAM | Freq: Two times a day (BID) | CUTANEOUS | 0 refills | Status: DC

## 2019-01-23 MED ORDER — ALPRAZOLAM 0.5 MG PO TABS: 0.2500 mg | ORAL_TABLET | Freq: Two times a day (BID) | ORAL | 1 refills | Status: DC | PRN

## 2019-01-23 NOTE — Progress Notes (Signed)
BP 125/87   Pulse 74   Temp 98.7 F (37.1 C)   SpO2 97%    Subjective:    Patient ID: Cindy Gilbert, female    DOB: [birth year], 44 y.o.   MRN: [id number]  HPI: Cindy Gilbert is a 43 y.o. female presenting on 01/23/2019 for comprehensive medical examination. Current medical complaints include:  Has been having some strange body odor, has been more of a musty smell. No rashes   ANXIETY/STRESS Duration:controlled Anxious mood: yes  Excessive worrying: yes Irritability: no  Sweating: no Nausea: no Palpitations:no Hyperventilation: no Panic attacks: no Agoraphobia: no  Obscessions/compulsions: no Depressed mood: no Depression screen [facility] 2/9 01/23/2019 03/22/2018 12/18/2016  Decreased Interest 1 0 0  Down, Depressed, Hopeless 1 1 0  PHQ - 2 Score 2 1 0  Altered sleeping 0 2 3  Tired, decreased energy 2 0 0  Change in appetite 1 0 0  Feeling bad or failure about yourself  1 1 1   Trouble concentrating 1 0 0  Moving slowly or fidgety/restless 0 0 0  Suicidal thoughts 0 0 0  PHQ-9 Score 7 4 4   Difficult doing work/chores Not difficult at all Somewhat difficult Somewhat difficult   Anhedonia: no Weight changes: no Insomnia: yes hard to fall asleep  Hypersomnia: no Fatigue/loss of energy: yes Feelings of worthlessness: no Feelings of guilt: no Impaired concentration/indecisiveness: no Suicidal ideations: no  Crying spells: no Recent Stressors/Life Changes: yes   Relationship problems: no   Family stress: no     Financial stress: no    Job stress: yes    Recent death/loss: no  INSOMNIA Duration: chronic Satisfied with sleep quality: yes Difficulty falling asleep: no Difficulty staying asleep: no Waking a few hours after sleep onset: no Early morning awakenings: no Daytime hypersomnolence: no Wakes feeling refreshed: yes Good sleep hygiene: yes Apnea: no Snoring: no Depressed/anxious mood: yes Recent stress: yes Restless legs/nocturnal leg cramps:  no Chronic pain/arthritis: no  Menopausal Symptoms: no  Depression Screen done today and results listed below:  Depression screen [facility] 2/9 01/23/2019 03/22/2018 12/18/2016  Decreased Interest 1 0 0  Down, Depressed, Hopeless 1 1 0  PHQ - 2 Score 2 1 0  Altered sleeping 0 2 3  Tired, decreased energy 2 0 0  Change in appetite 1 0 0  Feeling bad or failure about yourself  1 1 1   Trouble concentrating 1 0 0  Moving slowly or fidgety/restless 0 0 0  Suicidal thoughts 0 0 0  PHQ-9 Score 7 4 4   Difficult doing work/chores Not difficult at all Somewhat difficult Somewhat difficult   Past Medical History:  Past Medical History:  Diagnosis Date  . Allergy    Seasonal  . Depression    previously on lexapro, celexa,   . Insomnia   . Ovarian cyst    left  . Vitamin D deficiency     Surgical History:  Past Surgical History:  Procedure Laterality Date  . ADENOIDECTOMY    . CESAREAN SECTION  2001  . DILATION AND CURETTAGE OF UTERUS    . TONSILLECTOMY      Medications:  Current Outpatient Medications on File Prior to Visit  Medication Sig  . busPIRone (BUSPAR) 5 MG tablet Take 1 tablet (5 mg total) by mouth 3 (three) times daily.  . fluticasone (FLONASE) 50 MCG/ACT nasal spray Place 2 sprays into both nostrils daily.   No current facility-administered medications on file prior to visit.     Allergies:  Allergies  Allergen Reactions  . Ambien [Zolpidem Tartrate] Itching  . Belviq [Lorcaserin] Itching  . Bupropion Itching  . Cymbalta [Duloxetine Hcl] Other (See Comments)    Headache    Social History:  Social History   Socioeconomic History  . Marital status: Single    Spouse name: Not on file  . Number of children: Not on file  . Years of education: Not on file  . Highest education level: Not on file  Occupational History  . Not on file  Social Needs  . Financial resource strain: Not on file  . Food insecurity    Worry: Not on file    Inability: Not on file   . Transportation needs    Medical: Not on file    Non-medical: Not on file  Tobacco Use  . Smoking status: Former Smoker    Packs/day: 0.25    Years: 10.00    Pack years: 2.50    Types: Cigarettes    Quit date: 02/23/2014    Years since quitting: 4.9  . Smokeless tobacco: Never Used  Substance and Sexual Activity  . Alcohol use: Yes    Alcohol/week: 6.0 standard drinks    Types: 6 Glasses of wine per week  . Drug use: No  . Sexual activity: Not Currently    Birth control/protection: None  Lifestyle  . Physical activity    Days per week: Not on file    Minutes per session: Not on file  . Stress: Not on file  Relationships  . Social Herbalist on phone: Not on file    Gets together: Not on file    Attends religious service: Not on file    Active member of club or organization: Not on file    Attends meetings of clubs or organizations: Not on file    Relationship status: Not on file  . Intimate partner violence    Fear of current or ex partner: Not on file    Emotionally abused: Not on file    Physically abused: Not on file    Forced sexual activity: Not on file  Other Topics Concern  . Not on file  Social History Narrative  . Not on file   Social History   Tobacco Use  Smoking Status Former Smoker  . Packs/day: 0.25  . Years: 10.00  . Pack years: 2.50  . Types: Cigarettes  . Quit date: 02/23/2014  . Years since quitting: 4.9  Smokeless Tobacco Never Used   Social History   Substance and Sexual Activity  Alcohol Use Yes  . Alcohol/week: 6.0 standard drinks  . Types: 6 Glasses of wine per week    Family History:  Family History  Problem Relation Age of Onset  . Heart disease Father   . Stroke Father   . Atrial fibrillation Maternal Aunt   . Cancer Paternal Aunt        Breast  . Heart murmur Maternal Grandmother   . Asthma Paternal Grandmother     Past medical history, surgical history, medications, allergies, family history and social  history reviewed with patient today and changes made to appropriate areas of the chart.   Review of Systems  Constitutional: Positive for diaphoresis. Negative for chills, fever, malaise/fatigue and weight loss.  HENT: Negative.   Eyes: Negative.   Respiratory: Negative.   Cardiovascular: Negative.   Gastrointestinal: Positive for blood in stool (on the toilet tissue this AM only). Negative for abdominal pain, constipation, diarrhea, heartburn,  melena, nausea and vomiting.  Genitourinary: Negative.   Musculoskeletal: Positive for myalgias. Negative for back pain, falls, joint pain and neck pain.  Skin: Negative.   Neurological: Negative.   Endo/Heme/Allergies: Negative.   Psychiatric/Behavioral: Negative.     All other ROS negative except what is listed above and in the HPI.      Objective:    BP 125/87   Pulse 74   Temp 98.7 F (37.1 C)   SpO2 97%   Wt Readings from Last 3 Encounters:  07/08/18 202 lb 2 oz (91.7 kg)  06/23/18 203 lb 9.6 oz (92.4 kg)  03/22/18 224 lb (101.6 kg)    Physical Exam Vitals signs and nursing note reviewed.  Constitutional:      General: She is not in acute distress.    Appearance: Normal appearance. She is not ill-appearing, toxic-appearing or diaphoretic.  HENT:     Head: Normocephalic and atraumatic.     Right Ear: Tympanic membrane, ear canal and external ear normal. There is no impacted cerumen.     Left Ear: Tympanic membrane, ear canal and external ear normal. There is no impacted cerumen.     Nose: Nose normal. No congestion or rhinorrhea.     Mouth/Throat:     Mouth: Mucous membranes are moist.     Pharynx: Oropharynx is clear. No oropharyngeal exudate or posterior oropharyngeal erythema.  Eyes:     General: No scleral icterus.       Right eye: No discharge.        Left eye: No discharge.     Extraocular Movements: Extraocular movements intact.     Conjunctiva/sclera: Conjunctivae normal.     Pupils: Pupils are equal, round, and  reactive to light.  Neck:     Musculoskeletal: Normal range of motion and neck supple. No neck rigidity or muscular tenderness.     Vascular: No carotid bruit.  Cardiovascular:     Rate and Rhythm: Normal rate and regular rhythm.     Pulses: Normal pulses.     Heart sounds: No murmur. No friction rub. No gallop.   Pulmonary:     Effort: Pulmonary effort is normal. No respiratory distress.     Breath sounds: Normal breath sounds. No stridor. No wheezing, rhonchi or rales.  Chest:     Chest wall: No tenderness.     Breasts:        Right: Normal. No swelling, bleeding, inverted nipple, mass, nipple discharge, skin change or tenderness.        Left: Normal. No swelling, bleeding, inverted nipple, mass, nipple discharge, skin change or tenderness.     Comments: Hyperpigmentation in each axilla Abdominal:     General: Abdomen is flat. Bowel sounds are normal. There is no distension.     Palpations: Abdomen is soft. There is no mass.     Tenderness: There is no abdominal tenderness. There is no right CVA tenderness, left CVA tenderness, guarding or rebound.     Hernia: No hernia is present.  Genitourinary:    Comments: Pelvic exams deferred with shared decision making Musculoskeletal:        General: No swelling, tenderness, deformity or signs of injury.     Right lower leg: No edema.     Left lower leg: No edema.  Lymphadenopathy:     Cervical: No cervical adenopathy.  Skin:    General: Skin is warm and dry.     Capillary Refill: Capillary refill takes less than 2 seconds.  Coloration: Skin is not jaundiced or pale.     Findings: No bruising, erythema, lesion or rash.  Neurological:     General: No focal deficit present.     Mental Status: She is alert and oriented to person, place, and time. Mental status is at baseline.     Cranial Nerves: No cranial nerve deficit.     Sensory: No sensory deficit.     Motor: No weakness.     Coordination: Coordination normal.     Gait: Gait  normal.     Deep Tendon Reflexes: Reflexes normal.  Psychiatric:        Mood and Affect: Mood normal.        Behavior: Behavior normal.        Thought Content: Thought content normal.        Judgment: Judgment normal.     Results for orders placed or performed in visit on 09/27/18  HM MAMMOGRAPHY  Result Value Ref Range   HM Mammogram 0-4 Bi-Rad 0-4 Bi-Rad, Self Reported Normal      Assessment & Plan:   Problem List Items Addressed This Visit      Other   Vitamin D deficiency    Checking labs today. Await results. Treat as needed.       Relevant Orders   Vit D  25 hydroxy (rtn osteoporosis monitoring)   Insomnia    Under good control on current regimen. Continue current regimen. Continue to monitor. Call with any concerns. Refills given.        Anxiety    Under good control on current regimen. Continue current regimen. Continue to monitor. Call with any concerns. Refills given, should last about 6 months.        Relevant Medications   ALPRAZolam (XANAX) 0.5 MG tablet    Other Visit Diagnoses    Routine general medical examination at a health care facility    -  Primary   Vaccines up to date. Screening labs checked today. Pap and mammo up to date. Continue diet and exercise. Call with any concerns.    Relevant Orders   CBC with Differential OUT   Comp Met (CMET)   Lipid Panel w/o Chol/HDL Ratio OUT   TSH   UA/M w/rflx Culture, Routine   Bromhidrosis       Likley due to candida- will check labs to look for hormonal cause. Await results.    Relevant Orders   Estradiol   LH   FSH   Testosterone, free, total([facility])   DHEA-sulfate   Candidal intertrigo       Will treat with nystatin. Call if not getting better or getting worse. Continue to monitor.    Relevant Medications   nystatin cream (MYCOSTATIN)       Follow up plan: Return in about 6 months (around 07/23/2019).   LABORATORY TESTING:  - Pap smear: up to date  IMMUNIZATIONS:   - Tdap:  Tetanus vaccination status reviewed: last tetanus booster within 10 years. - Influenza: Refused - Pneumovax: Not applicable   SCREENING: -Mammogram: Up to date    PATIENT COUNSELING:   Advised to take 1 mg of folate supplement per day if capable of pregnancy.   Sexuality: Discussed sexually transmitted diseases, partner selection, use of condoms, avoidance of unintended pregnancy  and contraceptive alternatives.   Advised to avoid cigarette smoking.  I discussed with the patient that most people either abstain from alcohol or drink within safe limits (<=14/week and <=4 drinks/occasion for males, <=7/weeks  and <= 3 drinks/occasion for females) and that the risk for alcohol disorders and other health effects rises proportionally with the number of drinks per week and how often a drinker exceeds daily limits.  Discussed cessation/primary prevention of drug use and availability of treatment for abuse.   Diet: Encouraged to adjust caloric intake to maintain  or achieve ideal body weight, to reduce intake of dietary saturated fat and total fat, to limit sodium intake by avoiding high sodium foods and not adding table salt, and to maintain adequate dietary potassium and calcium preferably from fresh fruits, vegetables, and low-fat dairy products.    stressed the importance of regular exercise  Injury prevention: Discussed safety belts, safety helmets, smoke detector, smoking near bedding or upholstery.   Dental health: Discussed importance of regular tooth brushing, flossing, and dental visits.    NEXT PREVENTATIVE PHYSICAL DUE IN 1 YEAR. Return in about 6 months (around 07/23/2019).

## 2019-01-23 NOTE — Assessment & Plan Note (Signed)
Under good control on current regimen. Continue current regimen. Continue to monitor. Call with any concerns. Refills given, should last about 6 months.

## 2019-01-23 NOTE — Patient Instructions (Signed)
Health Maintenance, Female Adopting a healthy lifestyle and getting preventive care are important in promoting health and wellness. Ask your health care provider about:  The right schedule for you to have regular tests and exams.  Things you can do on your own to prevent diseases and keep yourself healthy. What should I know about diet, weight, and exercise? Eat a healthy diet   Eat a diet that includes plenty of vegetables, fruits, low-fat dairy products, and lean protein.  Do not eat a lot of foods that are high in solid fats, added sugars, or sodium. Maintain a healthy weight Body mass index (BMI) is used to identify weight problems. It estimates body fat based on height and weight. Your health care provider can help determine your BMI and help you achieve or maintain a healthy weight. Get regular exercise Get regular exercise. This is one of the most important things you can do for your health. Most adults should:  Exercise for at least 150 minutes each week. The exercise should increase your heart rate and make you sweat (moderate-intensity exercise).  Do strengthening exercises at least twice a week. This is in addition to the moderate-intensity exercise.  Spend less time sitting. Even light physical activity can be beneficial. Watch cholesterol and blood lipids Have your blood tested for lipids and cholesterol at 43 years of age, then have this test every 5 years. Have your cholesterol levels checked more often if:  Your lipid or cholesterol levels are high.  You are older than 43 years of age.  You are at high risk for heart disease. What should I know about cancer screening? Depending on your health history and family history, you may need to have cancer screening at various ages. This may include screening for:  Breast cancer.  Cervical cancer.  Colorectal cancer.  Skin cancer.  Lung cancer. What should I know about heart disease, diabetes, and high blood  pressure? Blood pressure and heart disease  High blood pressure causes heart disease and increases the risk of stroke. This is more likely to develop in people who have high blood pressure readings, are of African descent, or are overweight.  Have your blood pressure checked: ? Every 3-5 years if you are 18-39 years of age. ? Every year if you are 40 years old or older. Diabetes Have regular diabetes screenings. This checks your fasting blood sugar level. Have the screening done:  Once every three years after age 40 if you are at a normal weight and have a low risk for diabetes.  More often and at a younger age if you are overweight or have a high risk for diabetes. What should I know about preventing infection? Hepatitis B If you have a higher risk for hepatitis B, you should be screened for this virus. Talk with your health care provider to find out if you are at risk for hepatitis B infection. Hepatitis C Testing is recommended for:  Everyone born from 1945 through 1965.  Anyone with known risk factors for hepatitis C. Sexually transmitted infections (STIs)  Get screened for STIs, including gonorrhea and chlamydia, if: ? You are sexually active and are younger than 43 years of age. ? You are older than 43 years of age and your health care provider tells you that you are at risk for this type of infection. ? Your sexual activity has changed since you were last screened, and you are at increased risk for chlamydia or gonorrhea. Ask your health care provider if   you are at risk.  Ask your health care provider about whether you are at high risk for HIV. Your health care provider may recommend a prescription medicine to help prevent HIV infection. If you choose to take medicine to prevent HIV, you should first get tested for HIV. You should then be tested every 3 months for as long as you are taking the medicine. Pregnancy  If you are about to stop having your period (premenopausal) and  you may become pregnant, seek counseling before you get pregnant.  Take 400 to 800 micrograms (mcg) of folic acid every day if you become pregnant.  Ask for birth control (contraception) if you want to prevent pregnancy. Osteoporosis and menopause Osteoporosis is a disease in which the bones lose minerals and strength with aging. This can result in bone fractures. If you are 65 years old or older, or if you are at risk for osteoporosis and fractures, ask your health care provider if you should:  Be screened for bone loss.  Take a calcium or vitamin D supplement to lower your risk of fractures.  Be given hormone replacement therapy (HRT) to treat symptoms of menopause. Follow these instructions at home: Lifestyle  Do not use any products that contain nicotine or tobacco, such as cigarettes, e-cigarettes, and chewing tobacco. If you need help quitting, ask your health care provider.  Do not use street drugs.  Do not share needles.  Ask your health care provider for help if you need support or information about quitting drugs. Alcohol use  Do not drink alcohol if: ? Your health care provider tells you not to drink. ? You are pregnant, may be pregnant, or are planning to become pregnant.  If you drink alcohol: ? Limit how much you use to 0-1 drink a day. ? Limit intake if you are breastfeeding.  Be aware of how much alcohol is in your drink. In the [place] one drink equals one 12 oz bottle of beer (355 mL), one 5 oz glass of wine (148 mL), or one 1 oz glass of hard liquor (44 mL). General instructions  Schedule regular health, dental, and eye exams.  Stay current with your vaccines.  Tell your health care provider if: ? You often feel depressed. ? You have ever been abused or do not feel safe at home. Summary  Adopting a healthy lifestyle and getting preventive care are important in promoting health and wellness.  Follow your health care provider's instructions about healthy  diet, exercising, and getting tested or screened for diseases.  Follow your health care provider's instructions on monitoring your cholesterol and blood pressure. This information is not intended to replace advice given to you by your health care provider. Make sure you discuss any questions you have with your health care provider. Document Released: 08/25/2010 Document Revised: 02/02/2018 Document Reviewed: 02/02/2018 Elsevier Patient Education  2020 [employer]  

## 2019-01-23 NOTE — Assessment & Plan Note (Signed)
Checking labs today. Await results. Treat as needed.

## 2019-01-23 NOTE — Assessment & Plan Note (Signed)
Under good control on current regimen. Continue current regimen. Continue to monitor. Call with any concerns. Refills given.   

## 2019-01-24 LAB — COMPREHENSIVE METABOLIC PANEL
ALT: 13 IU/L (ref 0–32)
AST: 13 IU/L (ref 0–40)
Albumin/Globulin Ratio: 1.7 (ref 1.2–2.2)
Albumin: 4.4 g/dL (ref 3.8–4.8)
Alkaline Phosphatase: 95 IU/L (ref 39–117)
BUN/Creatinine Ratio: 17 (ref 9–23)
BUN: 13 mg/dL (ref 6–24)
Bilirubin Total: 0.6 mg/dL (ref 0.0–1.2)
CO2: 24 mmol/L (ref 20–29)
Calcium: 9.5 mg/dL (ref 8.7–10.2)
Chloride: 100 mmol/L (ref 96–106)
Creatinine, Ser: 0.78 mg/dL (ref 0.57–1.00)
GFR calc Af Amer: 108 mL/min/{1.73_m2} (ref >59)
GFR calc non Af Amer: 93 mL/min/{1.73_m2} (ref >59)
Globulin, Total: 2.6 g/dL (ref 1.5–4.5)
Glucose: 87 mg/dL (ref 65–99)
Potassium: 4.1 mmol/L (ref 3.5–5.2)
Sodium: 137 mmol/L (ref 134–144)
Total Protein: 7 g/dL (ref 6.0–8.5)

## 2019-01-24 LAB — CBC WITH DIFFERENTIAL/PLATELET
Basophils Absolute: 0.1 10*3/uL (ref 0.0–0.2)
Basos: 1 %
EOS (ABSOLUTE): 0.1 10*3/uL (ref 0.0–0.4)
Eos: 2 %
Hematocrit: 41.4 % (ref 34.0–46.6)
Hemoglobin: 13.6 g/dL (ref 11.1–15.9)
Immature Grans (Abs): 0 10*3/uL (ref 0.0–0.1)
Immature Granulocytes: 0 %
Lymphocytes Absolute: 3.5 10*3/uL — ABNORMAL HIGH (ref 0.7–3.1)
Lymphs: 50 %
MCH: 27.8 pg (ref 26.6–33.0)
MCHC: 32.9 g/dL (ref 31.5–35.7)
MCV: 85 fL (ref 79–97)
Monocytes Absolute: 0.4 10*3/uL (ref 0.1–0.9)
Monocytes: 5 %
Neutrophils Absolute: 2.9 10*3/uL (ref 1.4–7.0)
Neutrophils: 42 %
Platelets: 232 10*3/uL (ref 150–450)
RBC: 4.9 x10E6/uL (ref 3.77–5.28)
RDW: 13.1 % (ref 11.7–15.4)
WBC: 6.9 10*3/uL (ref 3.4–10.8)

## 2019-01-24 LAB — TESTOSTERONE, FREE, TOTAL, SHBG
Sex Hormone Binding: 39.1 nmol/L (ref 24.6–122.0)
Testosterone, Free: 3.8 pg/mL (ref 0.0–4.2)
Testosterone: 30 ng/dL (ref 8–48)

## 2019-01-24 LAB — LIPID PANEL W/O CHOL/HDL RATIO
Cholesterol, Total: 220 mg/dL — ABNORMAL HIGH (ref 100–199)
HDL: 73 mg/dL (ref >39)
LDL Chol Calc (NIH): 136 mg/dL — ABNORMAL HIGH (ref 0–99)
Triglycerides: 61 mg/dL (ref 0–149)
VLDL Cholesterol Cal: 11 mg/dL (ref 5–40)

## 2019-01-24 LAB — DHEA-SULFATE: DHEA-SO4: 190 ug/dL (ref 57.3–279.2)

## 2019-01-24 LAB — ESTRADIOL: Estradiol: 72.3 pg/mL

## 2019-01-24 LAB — TSH: TSH: 1.49 u[IU]/mL (ref 0.450–4.500)

## 2019-01-24 LAB — VITAMIN D 25 HYDROXY (VIT D DEFICIENCY, FRACTURES): Vit D, 25-Hydroxy: 18.7 ng/mL — ABNORMAL LOW (ref 30.0–100.0)

## 2019-01-24 LAB — FOLLICLE STIMULATING HORMONE: FSH: 64.3 m[IU]/mL

## 2019-01-24 LAB — LUTEINIZING HORMONE: LH: 61.2 m[IU]/mL

## 2019-01-25 ENCOUNTER — Other Ambulatory Visit: Payer: Self-pay | Admitting: Family Medicine

## 2019-01-25 MED ORDER — VITAMIN D (ERGOCALCIFEROL) 1.25 MG (50000 UNIT) PO CAPS: 50000.0000 [IU] | ORAL_CAPSULE | ORAL | 0 refills | Status: DC

## 2019-02-06 ENCOUNTER — Other Ambulatory Visit: Payer: Self-pay | Admitting: Obstetrics and Gynecology

## 2019-02-06 DIAGNOSIS — E049 Nontoxic goiter, unspecified: Secondary | ICD-10-CM

## 2019-02-06 DIAGNOSIS — Z01419 Encounter for gynecological examination (general) (routine) without abnormal findings: Secondary | ICD-10-CM | POA: Diagnosis not present

## 2019-02-06 DIAGNOSIS — Z6841 Body Mass Index (BMI) 40.0 and over, adult: Secondary | ICD-10-CM | POA: Diagnosis not present

## 2019-02-06 DIAGNOSIS — Z1151 Encounter for screening for human papillomavirus (HPV): Secondary | ICD-10-CM | POA: Diagnosis not present

## 2019-02-14 ENCOUNTER — Other Ambulatory Visit: Payer: Self-pay

## 2019-02-20 ENCOUNTER — Ambulatory Visit
Admission: RE | Admit: 2019-02-20 | Discharge: 2019-02-20 | Disposition: A | Discharge status: Home / Self Care | Payer: BC Managed Care – PPO | Source: Ambulatory Visit | Attending: Obstetrics and Gynecology | Admitting: Obstetrics and Gynecology

## 2019-02-20 DIAGNOSIS — E049 Nontoxic goiter, unspecified: Secondary | ICD-10-CM

## 2019-03-13 ENCOUNTER — Encounter: Payer: Self-pay | Admitting: Family Medicine

## 2019-03-16 ENCOUNTER — Encounter: Payer: Self-pay | Admitting: Family Medicine

## 2019-03-16 DIAGNOSIS — E042 Nontoxic multinodular goiter: Secondary | ICD-10-CM

## 2019-03-16 HISTORY — DX: Nontoxic multinodular goiter: E04.2

## 2019-04-13 ENCOUNTER — Other Ambulatory Visit: Payer: Self-pay | Admitting: Family Medicine

## 2019-07-28 ENCOUNTER — Telehealth (INDEPENDENT_AMBULATORY_CARE_PROVIDER_SITE_OTHER): Payer: 59 | Admitting: Family Medicine

## 2019-07-28 ENCOUNTER — Encounter: Payer: Self-pay | Admitting: Family Medicine

## 2019-07-28 DIAGNOSIS — M546 Pain in thoracic spine: Secondary | ICD-10-CM | POA: Diagnosis not present

## 2019-07-28 DIAGNOSIS — E559 Vitamin D deficiency, unspecified: Secondary | ICD-10-CM | POA: Diagnosis not present

## 2019-07-28 DIAGNOSIS — Z113 Encounter for screening for infections with a predominantly sexual mode of transmission: Secondary | ICD-10-CM

## 2019-07-28 DIAGNOSIS — G8929 Other chronic pain: Secondary | ICD-10-CM | POA: Diagnosis not present

## 2019-07-28 MED ORDER — ALPRAZOLAM 0.5 MG PO TABS: 0.2500 mg | ORAL_TABLET | Freq: Two times a day (BID) | ORAL | 1 refills | Status: DC | PRN

## 2019-07-28 NOTE — Progress Notes (Signed)
LMP 05/28/2019 (Exact Date)    Subjective:    Patient ID: Cindy Gilbert, female    DOB: 1975/05/08, 44 y.o.   MRN: [id number]  HPI: Cindy Gilbert is a 44 y.o. female  Chief Complaint  Patient presents with  . Anxiety    6 month    Still having the side pain. Has been doing a lot of yoga and stretching and has been doing everything that she can find and its still really painful. It seems to be directly related to dealing with other people's emotional issues. She has had aching and soreness in her L side. It's been going on for about 40 years, but has gotten much worse over the last 4-5 years. Nothing seems to be making it better and emotional stress makes it worse. Pain does not radiate. She is very concerned that there is something wrong going on.   ANXIETY/STRESS Duration: chronic Status:better Anxious mood: better  Excessive worrying: no Irritability: no  Sweating: no Nausea: no Palpitations:no Hyperventilation: no Panic attacks: no Agoraphobia: no  Obscessions/compulsions: no Depressed mood: no Depression screen [facility] 2/9 07/28/2019 01/23/2019 03/22/2018 12/18/2016  Decreased Interest 0 1 0 0  Down, Depressed, Hopeless 1 1 1  0  PHQ - 2 Score 1 2 1  0  Altered sleeping 1 0 2 3  Tired, decreased energy 1 2 0 0  Change in appetite 0 1 0 0  Feeling bad or failure about yourself  1 1 1 1   Trouble concentrating 2 1 0 0  Moving slowly or fidgety/restless 0 0 0 0  Suicidal thoughts 0 0 0 0  PHQ-9 Score 6 7 4 4   Difficult doing work/chores Not difficult at all Not difficult at all Somewhat difficult Somewhat difficult   GAD 7 : Generalized Anxiety Score 07/28/2019 03/22/2018  Nervous, Anxious, on Edge 1 1  Control/stop worrying 0 2  Worry too much - different things 1 2  Trouble relaxing 0 1  Restless 0 0  Easily annoyed or irritable 0 1  Afraid - awful might happen 0 1  Total GAD 7 Score 2 8  Anxiety Difficulty Not difficult at all Somewhat difficult   Anhedonia:  no Weight changes: no Insomnia: no   Hypersomnia: no Fatigue/loss of energy: yes Feelings of worthlessness: no Feelings of guilt: no Impaired concentration/indecisiveness: no Suicidal ideations: no  Crying spells: no Recent Stressors/Life Changes: no   Relationship problems: no   Family stress: no     Financial stress: no    Job stress: no    Recent death/loss: no  Recently had a traumatic episode. Does not wants to go into details. She is going to need to have STI screening. She states that she will be OK and is safe at this time. No other concerns with concerns or complaints at this time.    Relevant past medical, surgical, family and social history reviewed and updated as indicated. Interim medical history since our last visit reviewed. Allergies and medications reviewed and updated.  Review of Systems  Constitutional: Negative.   Respiratory: Negative.   Cardiovascular: Negative.   Gastrointestinal: Negative.   Musculoskeletal: Positive for myalgias. Negative for arthralgias, back pain, gait problem, joint swelling, neck pain and neck stiffness.  Skin: Negative.   Neurological: Negative.   Hematological: Negative.   Psychiatric/Behavioral: Positive for dysphoric mood. Negative for agitation, behavioral problems, confusion, decreased concentration, hallucinations, self-injury, sleep disturbance and suicidal ideas. The patient is nervous/anxious. The patient is not hyperactive.     Per  HPI unless specifically indicated above     Objective:    LMP 05/28/2019 (Exact Date)   Wt Readings from Last 3 Encounters:  07/08/18 202 lb 2 oz (91.7 kg)  06/23/18 203 lb 9.6 oz (92.4 kg)  03/22/18 224 lb (101.6 kg)    Physical Exam Vitals and nursing note reviewed.  Constitutional:      General: She is not in acute distress.    Appearance: Normal appearance. She is not ill-appearing, toxic-appearing or diaphoretic.  HENT:     Head: Normocephalic and atraumatic.     Right Ear:  External ear normal.     Left Ear: External ear normal.     Nose: Nose normal.     Mouth/Throat:     Mouth: Mucous membranes are moist.     Pharynx: Oropharynx is clear.  Eyes:     General: No scleral icterus.       Right eye: No discharge.        Left eye: No discharge.     Conjunctiva/sclera: Conjunctivae normal.     Pupils: Pupils are equal, round, and reactive to light.  Pulmonary:     Effort: Pulmonary effort is normal. No respiratory distress.     Comments: Speaking in full sentences Musculoskeletal:        General: Normal range of motion.     Cervical back: Normal range of motion.  Skin:    Coloration: Skin is not jaundiced or pale.     Findings: No bruising, erythema, lesion or rash.  Neurological:     Mental Status: She is alert and oriented to person, place, and time. Mental status is at baseline.  Psychiatric:        Mood and Affect: Mood normal.        Behavior: Behavior normal.        Thought Content: Thought content normal.        Judgment: Judgment normal.     Results for orders placed or performed in visit on 01/23/19  CBC with Differential OUT  Result Value Ref Range   WBC 6.9 3.4 - 10.8 x10E3/uL   RBC 4.90 3.77 - 5.28 x10E6/uL   Hemoglobin 13.6 11.1 - 15.9 g/dL   Hematocrit 41.4 34.0 - 46.6 %   MCV 85 79 - 97 fL   MCH 27.8 26.6 - 33.0 pg   MCHC 32.9 31.5 - 35.7 g/dL   RDW 13.1 11.7 - 15.4 %   Platelets 232 150 - 450 x10E3/uL   Neutrophils 42 Not Estab. %   Lymphs 50 Not Estab. %   Monocytes 5 Not Estab. %   Eos 2 Not Estab. %   Basos 1 Not Estab. %   Neutrophils Absolute 2.9 1.4 - 7.0 x10E3/uL   Lymphocytes Absolute 3.5 (H) 0.7 - 3.1 x10E3/uL   Monocytes Absolute 0.4 0.1 - 0.9 x10E3/uL   EOS (ABSOLUTE) 0.1 0.0 - 0.4 x10E3/uL   Basophils Absolute 0.1 0.0 - 0.2 x10E3/uL   Immature Granulocytes 0 Not Estab. %   Immature Grans (Abs) 0.0 0.0 - 0.1 x10E3/uL  Comp Met (CMET)  Result Value Ref Range   Glucose 87 65 - 99 mg/dL   BUN 13 6 - 24 mg/dL     Creatinine, Ser 0.78 0.57 - 1.00 mg/dL   GFR calc non Af Amer 93 >59 mL/min/1.73   GFR calc Af Amer 108 >59 mL/min/1.73   BUN/Creatinine Ratio 17 9 - 23   Sodium 137 134 - 144 mmol/L   Potassium  4.1 3.5 - 5.2 mmol/L   Chloride 100 96 - 106 mmol/L   CO2 24 20 - 29 mmol/L   Calcium 9.5 8.7 - 10.2 mg/dL   Total Protein 7.0 6.0 - 8.5 g/dL   Albumin 4.4 3.8 - 4.8 g/dL   Globulin, Total 2.6 1.5 - 4.5 g/dL   Albumin/Globulin Ratio 1.7 1.2 - 2.2   Bilirubin Total 0.6 0.0 - 1.2 mg/dL   Alkaline Phosphatase 95 39 - 117 IU/L   AST 13 0 - 40 IU/L   ALT 13 0 - 32 IU/L  Lipid Panel w/o Chol/HDL Ratio OUT  Result Value Ref Range   Cholesterol, Total 220 (H) 100 - 199 mg/dL   Triglycerides 61 0 - 149 mg/dL   HDL 73 >39 mg/dL   VLDL Cholesterol Cal 11 5 - 40 mg/dL   LDL Chol Calc (NIH) 136 (H) 0 - 99 mg/dL  TSH  Result Value Ref Range   TSH 1.490 0.450 - 4.500 uIU/mL  UA/M w/rflx Culture, Routine   Specimen: Urine   URINE  Result Value Ref Range   Specific Gravity, UA 1.025 1.005 - 1.030   pH, UA 5.5 5.0 - 7.5   Color, UA Yellow Yellow   Appearance Ur Clear Clear   Leukocytes,UA Negative Negative   Protein,UA Negative Negative/Trace   Glucose, UA Negative Negative   Ketones, UA Negative Negative   RBC, UA Negative Negative   Bilirubin, UA Negative Negative   Urobilinogen, Ur 0.2 0.2 - 1.0 mg/dL   Nitrite, UA Negative Negative  Vit D  25 hydroxy (rtn osteoporosis monitoring)  Result Value Ref Range   Vit D, 25-Hydroxy 18.7 (L) 30.0 - 100.0 ng/mL  Estradiol  Result Value Ref Range   Estradiol 72.3 pg/mL  LH  Result Value Ref Range   LH 61.2 mIU/mL  FSH  Result Value Ref Range   FSH 64.3 mIU/mL  Testosterone, free, total([facility])  Result Value Ref Range   Testosterone 30 8 - 48 ng/dL   Testosterone, Free 3.8 0.0 - 4.2 pg/mL   Sex Hormone Binding 39.1 24.6 - 122.0 nmol/L  DHEA-sulfate  Result Value Ref Range   DHEA-SO4 190.0 57.3 - 279.2 ug/dL      Assessment &  Plan:   Problem List Items Addressed This Visit      Other   Vitamin D deficiency   Relevant Orders   VITAMIN D 25 Hydroxy (Vit-D Deficiency, Fractures)    Other Visit Diagnoses    Chronic left-sided thoracic back pain    -  Primary   Chronic and worsening. Not improving with body work. Will obtain x-ray and pending results, will get MRI. If normal consider somatic therapy.   Relevant Orders   DG Thoracic Spine W/Swimmers   Routine screening for STI (sexually transmitted infection)       Labs to be dawn shortly. Await results.    Relevant Orders   HIV Antibody (routine testing w rflx)   GC/Chlamydia Probe Amp   Hepatitis, Acute   RPR   WET PREP FOR [place], YEAST, CLUE       Follow up plan: Return in about 6 months (around 01/27/2020).    . This visit was completed via MyChart due to the restrictions of the COVID-19 pandemic. All issues as above were discussed and addressed. Physical exam was done as above through visual confirmation on MyChart. If it was felt that the patient should be evaluated in the office, they were directed there. The patient verbally  consented to this visit. . Location of the patient: home . Location of the provider: work . Those involved with this call:  . Provider: Park Liter, DO . CMA: Lauretta Grill, RMA . Front Desk/Registration: Don Perking  . Time spent on call: 25 minutes with patient face to face via video conference. More than 50% of this time was spent in counseling and coordination of care. 40 minutes total spent in review of patient's record and preparation of their chart.

## 2019-09-19 ENCOUNTER — Encounter: Payer: Self-pay | Admitting: Family Medicine

## 2019-09-25 NOTE — Telephone Encounter (Signed)
Called patient to schedule an OV in order to fill out paperwork, per Dr Wynetta Emery. LVM for pt to return call to schedule.

## 2019-10-06 ENCOUNTER — Encounter: Payer: Self-pay | Admitting: Family Medicine

## 2019-10-09 ENCOUNTER — Telehealth: Payer: 59 | Admitting: Family Medicine

## 2019-10-12 ENCOUNTER — Encounter: Payer: Self-pay | Admitting: Family Medicine

## 2019-10-12 ENCOUNTER — Telehealth (INDEPENDENT_AMBULATORY_CARE_PROVIDER_SITE_OTHER): Payer: 59 | Admitting: Family Medicine

## 2019-10-12 DIAGNOSIS — U071 COVID-19: Secondary | ICD-10-CM | POA: Diagnosis not present

## 2019-10-12 NOTE — Progress Notes (Signed)
Pulse 76   Temp (!) 97.2 F (36.2 C)   Wt 242 lb 4.8 oz (109.9 kg)   LMP 09/23/2019   SpO2 98%   BMI 44.32 kg/m    Subjective:    Patient ID: Cindy Gilbert, female    DOB: 1975/10/29, 44 y.o.   MRN: [id number]  HPI: Cindy Gilbert is a 44 y.o. female  Chief Complaint  Patient presents with  . Obesity   WEIGHT GAIN- Cindy Gilbert has been working on trying to lose weight for about 20 years. She has been very successful in the past, but then she will gain weight again and gain more than she had originally. She is very frustrated and has reached out to a bariatric clinic and is considering surgery Duration: chronic Previous attempts at weight loss: yes- see below Complications of obesity: None Peak weight: 242 lbs Weight loss goal: 175lbs Weight loss to date:  none Requesting obesity pharmacotherapy: no Current weight loss supplements/medications: no Previous weight loss supplements/meds: yes  Tesia has done weight watchers x3, worked with a Clinical research associate, seen nutritionisht, medical weight loss with bariatric center 3x. She has tried belviq, phentermine, q-symia, phentermine-topamax. All of them have caused her to lose weight and then she would gain weight back, usually with an additional 10 pounds.   Weight watchers- 2002 Weriht watchers- 2014 Medical weight loss- 2009 Medical weight loss- 2016 Medical weigh loss- 2020 ([facility]) loss 28lbs, gained back 39 Feb 2021 Trainer- 2009, 2016  Tested positive on Friday for COVID. She is in quarantine and is feeling well. She is frustrated, but starting to feel more like herself.   Relevant past medical, surgical, family and social history reviewed and updated as indicated. Interim medical history since our last visit reviewed. Allergies and medications reviewed and updated.  Review of Systems  Constitutional: Positive for chills, diaphoresis and fatigue. Negative for activity change, appetite change, fever and unexpected weight  change.  HENT: Positive for congestion, postnasal drip and rhinorrhea. Negative for dental problem, drooling, ear discharge, ear pain, facial swelling, hearing loss, mouth sores, nosebleeds, sinus pressure, sinus pain, sneezing, sore throat, tinnitus, trouble swallowing and voice change.   Eyes: Negative.   Respiratory: Negative.   Cardiovascular: Negative.   Gastrointestinal: Negative.   Psychiatric/Behavioral: Negative.     Per HPI unless specifically indicated above     Objective:    Pulse 76   Temp (!) 97.2 F (36.2 C)   Wt 242 lb 4.8 oz (109.9 kg)   LMP 09/23/2019   SpO2 98%   BMI 44.32 kg/m   Wt Readings from Last 3 Encounters:  10/12/19 242 lb 4.8 oz (109.9 kg)  07/08/18 202 lb 2 oz (91.7 kg)  06/23/18 203 lb 9.6 oz (92.4 kg)    Physical Exam Vitals and nursing note reviewed.  Constitutional:      General: She is not in acute distress.    Appearance: Normal appearance. She is not ill-appearing, toxic-appearing or diaphoretic.  HENT:     Head: Normocephalic and atraumatic.     Right Ear: External ear normal.     Left Ear: External ear normal.     Nose: Nose normal.     Mouth/Throat:     Mouth: Mucous membranes are moist.     Pharynx: Oropharynx is clear.  Eyes:     General: No scleral icterus.       Right eye: No discharge.        Left eye: No discharge.     Conjunctiva/sclera:  Conjunctivae normal.     Pupils: Pupils are equal, round, and reactive to light.  Pulmonary:     Effort: Pulmonary effort is normal. No respiratory distress.     Comments: Speaking in full sentences Musculoskeletal:        General: Normal range of motion.     Cervical back: Normal range of motion.  Skin:    Coloration: Skin is not jaundiced or pale.     Findings: No bruising, erythema, lesion or rash.  Neurological:     Mental Status: She is alert and oriented to person, place, and time. Mental status is at baseline.  Psychiatric:        Mood and Affect: Mood normal.         Behavior: Behavior normal.        Thought Content: Thought content normal.        Judgment: Judgment normal.     Results for orders placed or performed in visit on 01/23/19  CBC with Differential OUT  Result Value Ref Range   WBC 6.9 3.4 - 10.8 x10E3/uL   RBC 4.90 3.77 - 5.28 x10E6/uL   Hemoglobin 13.6 11.1 - 15.9 g/dL   Hematocrit 41.4 34.0 - 46.6 %   MCV 85 79 - 97 fL   MCH 27.8 26.6 - 33.0 pg   MCHC 32.9 31 - 35 g/dL   RDW 13.1 11.7 - 15.4 %   Platelets 232 150 - 450 x10E3/uL   Neutrophils 42 Not Estab. %   Lymphs 50 Not Estab. %   Monocytes 5 Not Estab. %   Eos 2 Not Estab. %   Basos 1 Not Estab. %   Neutrophils Absolute 2.9 1 - 7 x10E3/uL   Lymphocytes Absolute 3.5 (H) 0 - 3 x10E3/uL   Monocytes Absolute 0.4 0 - 0 x10E3/uL   EOS (ABSOLUTE) 0.1 0.0 - 0.4 x10E3/uL   Basophils Absolute 0.1 0 - 0 x10E3/uL   Immature Granulocytes 0 Not Estab. %   Immature Grans (Abs) 0.0 0.0 - 0.1 x10E3/uL  Comp Met (CMET)  Result Value Ref Range   Glucose 87 65 - 99 mg/dL   BUN 13 6 - 24 mg/dL   Creatinine, Ser 0.78 0.57 - 1.00 mg/dL   GFR calc non Af Amer 93 >59 mL/min/1.73   GFR calc Af Amer 108 >59 mL/min/1.73   BUN/Creatinine Ratio 17 9 - 23   Sodium 137 134 - 144 mmol/L   Potassium 4.1 3.5 - 5.2 mmol/L   Chloride 100 96 - 106 mmol/L   CO2 24 20 - 29 mmol/L   Calcium 9.5 8.7 - 10.2 mg/dL   Total Protein 7.0 6.0 - 8.5 g/dL   Albumin 4.4 3.8 - 4.8 g/dL   Globulin, Total 2.6 1.5 - 4.5 g/dL   Albumin/Globulin Ratio 1.7 1.2 - 2.2   Bilirubin Total 0.6 0.0 - 1.2 mg/dL   Alkaline Phosphatase 95 39 - 117 IU/L   AST 13 0 - 40 IU/L   ALT 13 0 - 32 IU/L  Lipid Panel w/o Chol/HDL Ratio OUT  Result Value Ref Range   Cholesterol, Total 220 (H) 100 - 199 mg/dL   Triglycerides 61 0 - 149 mg/dL   HDL 73 >39 mg/dL   VLDL Cholesterol Cal 11 5 - 40 mg/dL   LDL Chol Calc (NIH) 136 (H) 0 - 99 mg/dL  TSH  Result Value Ref Range   TSH 1.490 0.450 - 4.500 uIU/mL  UA/M w/rflx Culture, Routine       Specimen: Urine   URINE  Result Value Ref Range   Specific Gravity, UA 1.025 1.005 - 1.030   pH, UA 5.5 5.0 - 7.5   Color, UA Yellow Yellow   Appearance Ur Clear Clear   Leukocytes,UA Negative Negative   Protein,UA Negative Negative/Trace   Glucose, UA Negative Negative   Ketones, UA Negative Negative   RBC, UA Negative Negative   Bilirubin, UA Negative Negative   Urobilinogen, Ur 0.2 0.2 - 1.0 mg/dL   Nitrite, UA Negative Negative  Vit D  25 hydroxy (rtn osteoporosis monitoring)  Result Value Ref Range   Vit D, 25-Hydroxy 18.7 (L) 30.0 - 100.0 ng/mL  Estradiol  Result Value Ref Range   Estradiol 72.3 pg/mL  LH  Result Value Ref Range   LH 61.2 mIU/mL  FSH  Result Value Ref Range   FSH 64.3 mIU/mL  Testosterone, free, total([facility])  Result Value Ref Range   Testosterone 30 8 - 48 ng/dL   Testosterone, Free 3.8 0.0 - 4.2 pg/mL   Sex Hormone Binding 39.1 24.6 - 122.0 nmol/L  DHEA-sulfate  Result Value Ref Range   DHEA-SO4 190.0 57.3 - 279.2 ug/dL      Assessment & Plan:   Problem List Items Addressed This Visit      Other   Morbid obesity (HCC) - Primary    Will get forms filled out for bariatric clinic. Continue to work on diet and exercise. Call with any concerns. Continue to monitor.        Other Visit Diagnoses    COVID-19       Symptomatic care discussed. Continue to monitor. Call if starts feeling worse or not feeling better.        Follow up plan: Return if symptoms worsen or fail to improve.   . This visit was completed via MyChart due to the restrictions of the COVID-19 pandemic. All issues as above were discussed and addressed. Physical exam was done as above through visual confirmation on MyChart. If it was felt that the patient should be evaluated in the office, they were directed there. The patient verbally consented to this visit. . Location of the patient: home . Location of the provider: work . Those involved with this call:   . Provider: Clessie Karras, DO . CMA: Brittany Russell, CMA . Front Desk/Registration: Christan Williamson  . Time spent on call: 25 minutes with patient face to face via video conference. More than 50% of this time was spent in counseling and coordination of care. 40 minutes total spent in review of patient's record and preparation of their chart.      

## 2019-10-15 ENCOUNTER — Encounter: Payer: Self-pay | Admitting: Family Medicine

## 2019-10-15 HISTORY — DX: Morbid (severe) obesity due to excess calories: E66.01

## 2019-10-15 NOTE — Assessment & Plan Note (Signed)
Will get forms filled out for bariatric clinic. Continue to work on diet and exercise. Call with any concerns. Continue to monitor.

## 2019-11-07 ENCOUNTER — Ambulatory Visit
Admission: RE | Admit: 2019-11-07 | Discharge: 2019-11-07 | Disposition: A | Discharge status: Home / Self Care | Payer: 59 | Source: Ambulatory Visit | Attending: Family Medicine | Admitting: Family Medicine

## 2019-11-07 ENCOUNTER — Other Ambulatory Visit: Payer: Self-pay

## 2019-11-07 ENCOUNTER — Ambulatory Visit
Admission: RE | Admit: 2019-11-07 | Discharge: 2019-11-07 | Disposition: A | Discharge status: Home / Self Care | Payer: 59 | Attending: Family Medicine | Admitting: Family Medicine

## 2019-11-07 ENCOUNTER — Ambulatory Visit
Admission: EM | Admit: 2019-11-07 | Discharge: 2019-11-07 | Disposition: A | Discharge status: Home / Self Care | Payer: 59 | Attending: Family Medicine | Admitting: Family Medicine

## 2019-11-07 DIAGNOSIS — R109 Unspecified abdominal pain: Secondary | ICD-10-CM | POA: Diagnosis not present

## 2019-11-07 DIAGNOSIS — M546 Pain in thoracic spine: Secondary | ICD-10-CM | POA: Diagnosis not present

## 2019-11-07 DIAGNOSIS — R0781 Pleurodynia: Secondary | ICD-10-CM

## 2019-11-07 LAB — POCT URINALYSIS DIP (MANUAL ENTRY)
Bilirubin, UA: NEGATIVE
Blood, UA: NEGATIVE
Glucose, UA: NEGATIVE mg/dL
Ketones, POC UA: NEGATIVE mg/dL
Leukocytes, UA: NEGATIVE
Nitrite, UA: NEGATIVE
Protein Ur, POC: NEGATIVE mg/dL
Spec Grav, UA: >=1.03 — AB (ref 1.010–1.025)
Urobilinogen, UA: 0.2 E.U./dL
pH, UA: 5.5 (ref 5.0–8.0)

## 2019-11-07 MED ORDER — KETOROLAC TROMETHAMINE 60 MG/2ML IM SOLN
60.0000 mg | Freq: Once | INTRAMUSCULAR | Status: AC
  Administered 2019-11-07: 60 mg via INTRAMUSCULAR

## 2019-11-07 NOTE — ED Provider Notes (Signed)
Roderic Palau    CSN: [id number] Arrival date & time: 11/07/19  1450      History   Chief Complaint Chief Complaint  Patient presents with  . Flank Pain    HPI Cindy Gilbert is a 44 y.o. female.   Reports that she has been experiencing left-sided rib pain for about the last 5 years.  Has been worse over the last week.  Reports that she has been seen by her primary care provider for this as well.  Upon chart review, there was an x-ray ordered in June that was never completed.  Patient reports that she was not able to have the x-ray done.  Patient reports that pain is better if she lifts her left arm up over her head.  Reports that she is experiencing pain so frequently now that she is having a hard time getting comfortable.  Reports that she has muscle relaxers and anti-inflammatories at home.  Expresses concern that those are symptom relievers and not getting to the problem.  Denies headache, cough, shortness of breath, nausea, vomiting, diarrhea, rash, fever, other symptoms.  ROS per HPI  The history is provided by the patient.  Flank Pain    Past Medical History:  Diagnosis Date  . Allergy    Seasonal  . Depression    previously on lexapro, celexa,   . Insomnia   . Ovarian cyst    left  . Vitamin D deficiency     Patient Active Problem List   Diagnosis Date Noted  . Morbid obesity ([place]) 10/15/2019  . Multinodular goiter (nontoxic) 03/16/2019  . Anxiety 03/22/2018  . Vitamin D deficiency 03/11/2018  . Controlled substance agreement signed 12/18/2016  . Insomnia 12/18/2016    Past Surgical History:  Procedure Laterality Date  . ADENOIDECTOMY    . CESAREAN SECTION  2001  . DILATION AND CURETTAGE OF UTERUS    . TONSILLECTOMY      OB History    Gravida  4   Para  1   Term  1   Preterm      AB  2   Living  1     SAB  1   TAB  1   Ectopic      Multiple      Live Births  1            Home Medications    Prior to Admission  medications   Medication Sig Start Date End Date Taking? Authorizing Provider  ALPRAZolam Duanne Moron) 0.5 MG tablet Take 0.5-2 tablets (0.25-1 mg total) by mouth 2 (two) times daily as needed for anxiety. 07/28/19   Johnson, Megan P, DO  busPIRone (BUSPAR) 5 MG tablet Take 1 tablet (5 mg total) by mouth 3 (three) times daily. Patient not taking: Reported on 07/28/2019 03/22/18   Park Liter P, DO  fluticasone (FLONASE) 50 MCG/ACT nasal spray Place 2 sprays into both nostrils daily. 06/10/18   Johnson, Megan P, DO  Vitamin D, Ergocalciferol, (DRISDOL) 1.25 MG (50000 UNIT) CAPS capsule TAKE 1 CAPSULE (50,000 UNITS TOTAL) BY MOUTH EVERY 7 (SEVEN) DAYS. 04/13/19   [name], DO    Family History Family History  Problem Relation Age of Onset  . Heart disease Father   . Stroke Father   . Atrial fibrillation Maternal Aunt   . Cancer Paternal Aunt        Breast  . Heart murmur Maternal Grandmother   . Asthma Paternal Grandmother   . Hypertension  Mother   . Hyperlipidemia Mother     Social History Social History   Tobacco Use  . Smoking status: Former Smoker    Packs/day: 0.25    Years: 10.00    Pack years: 2.50    Types: Cigarettes    Quit date: 02/23/2014    Years since quitting: 5.7  . Smokeless tobacco: Never Used  Vaping Use  . Vaping Use: Never used  Substance Use Topics  . Alcohol use: Yes    Alcohol/week: 6.0 standard drinks    Types: 6 Glasses of wine per week  . Drug use: No     Allergies   Ambien [zolpidem tartrate], Belviq [lorcaserin], Bupropion, and Cymbalta [duloxetine hcl]   Review of Systems Review of Systems  Genitourinary: Positive for flank pain.     Physical Exam Triage Vital Signs ED Triage Vitals  Enc Vitals Group     BP 11/07/19 1453 136/89     Pulse Rate 11/07/19 1453 76     Resp 11/07/19 1453 (!) 21     Temp 11/07/19 1453 98.8 F (37.1 C)     Temp Source 11/07/19 1453 Oral     SpO2 11/07/19 1453 94 %     Weight --      Height --       Head Circumference --      Peak Flow --      Pain Score 11/07/19 1451 8     Pain Loc --      Pain Edu? --      Excl. in [place]? --    No data found.  Updated Vital Signs BP 136/89 (BP Location: Right Arm)   Pulse 76   Temp 98.8 F (37.1 C) (Oral)   Resp (!) 21   LMP 09/29/2019   SpO2 94%   Breastfeeding No   Visual Acuity Right Eye Distance:   Left Eye Distance:   Bilateral Distance:    Right Eye Near:   Left Eye Near:    Bilateral Near:     Physical Exam Vitals and nursing note reviewed.  Constitutional:      General: She is not in acute distress.    Appearance: She is well-developed. She is obese. She is not ill-appearing.  HENT:     Head: Normocephalic and atraumatic.     Mouth/Throat:     Mouth: Mucous membranes are moist.     Pharynx: Oropharynx is clear.  Eyes:     Extraocular Movements: Extraocular movements intact.     Conjunctiva/sclera: Conjunctivae normal.     Pupils: Pupils are equal, round, and reactive to light.  Cardiovascular:     Rate and Rhythm: Normal rate and regular rhythm.     Heart sounds: Normal heart sounds. No murmur heard.   Pulmonary:     Effort: Pulmonary effort is normal. No respiratory distress.     Breath sounds: Normal breath sounds. No stridor. No wheezing, rhonchi or rales.  Chest:     Chest wall: No tenderness.  Abdominal:     General: Bowel sounds are normal. There is no distension.     Palpations: Abdomen is soft. There is no mass.     Tenderness: There is no abdominal tenderness. There is no right CVA tenderness, left CVA tenderness, guarding or rebound.     Hernia: No hernia is present.  Musculoskeletal:        General: Tenderness (Left rib that extends to under the breast) present.     Cervical back:  Normal range of motion and neck supple.  Skin:    General: Skin is warm and dry.     Capillary Refill: Capillary refill takes less than 2 seconds.  Neurological:     General: No focal deficit present.     Mental Status:  She is alert and oriented to person, place, and time.  Psychiatric:        Mood and Affect: Mood normal.        Behavior: Behavior normal.      UC Treatments / Results  Labs (all labs ordered are listed, but only abnormal results are displayed) Labs Reviewed  POCT URINALYSIS DIP (MANUAL ENTRY) - Abnormal; Notable for the following components:      Result Value   Spec Grav, UA >=1.030 (*)    All other components within normal limits  CBC  COMPREHENSIVE METABOLIC PANEL  LIPASE    EKG   Radiology DG Ribs Unilateral W/Chest Left  Result Date: 11/07/2019 CLINICAL DATA:  Left rib pain EXAM: LEFT RIBS AND CHEST - 3+ VIEW COMPARISON:  None. FINDINGS: No fracture or other bone lesions are seen involving the ribs. There is no evidence of pneumothorax or pleural effusion. Both lungs are clear. Heart size and mediastinal contours are within normal limits. IMPRESSION: Negative. Electronically Signed   By: Prudencio Pair M.D.   On: 11/07/2019 17:22   DG Thoracic Spine W/Swimmers  Result Date: 11/07/2019 CLINICAL DATA:  Back pain EXAM: THORACIC SPINE - 3 VIEWS COMPARISON:  None. FINDINGS: There is no evidence of thoracic spine fracture. Alignment is normal. No other significant bone abnormalities are identified. IMPRESSION: Negative. Electronically Signed   By: Fidela Salisbury MD   On: 11/07/2019 17:17    Procedures Procedures (including critical care time)  Medications Ordered in UC Medications  ketorolac (TORADOL) injection 60 mg (60 mg Intramuscular Given 11/07/19 1558)    Initial Impression / Assessment and Plan / UC Course  I have reviewed the triage vital signs and the nursing notes.  Pertinent labs & imaging results that were available during my care of the patient were reviewed by me and considered in my medical decision making (see chart for details).     Left rib pain  UA unremarkable in office today Toradol 60 mg IM given in office today Presents with left rib pain that  is been present for the last 5 years Has been worse over the last week  Has been taking over-the-counter pain relievers with temporary relief with Aleve Has not had abdominal imaging performed Will x-ray today X-rays negative Labs are pending We will be in contact with abnormal labs and treat accordingly  Unsure of etiology of pain at this time, could possibly be reproductive referred pain Instructed to follow-up with primary care, maybe they can get you in outpatient CT or MRI to further evaluate the cause of your pain Does report that she has had history of ovarian cyst in the past, but does deny that these caused pain and referral locations  Final Clinical Impressions(s) / UC Diagnoses   Final diagnoses:  Rib pain on left side     Discharge Instructions     Left rib and left chest xray, we will make a more detailed plan from there  Labs are pending, we will notify you of abnormal results and treat accordingly  Your UA was negative for infection in the office today  You have received toradol as an injection for pain in the office today  Follow up  with primary care as needed    ED Prescriptions    None     PDMP not reviewed this encounter.   Faustino Congress, NP 11/07/19 1908

## 2019-11-07 NOTE — Discharge Instructions (Signed)
Left rib and left chest xray, we will make a more detailed plan from there  Labs are pending, we will notify you of abnormal results and treat accordingly  Your UA was negative for infection in the office today  You have received toradol as an injection for pain in the office today  Follow up with primary care as needed

## 2019-11-07 NOTE — ED Triage Notes (Signed)
Pt is here with left flank pain that started last night, pt has not taken any meds to relieve discomfort.

## 2019-11-08 LAB — COMPREHENSIVE METABOLIC PANEL
ALT: 21 IU/L (ref 0–32)
AST: 17 IU/L (ref 0–40)
Albumin/Globulin Ratio: 1.5 (ref 1.2–2.2)
Albumin: 4.3 g/dL (ref 3.8–4.8)
Alkaline Phosphatase: 81 IU/L (ref 44–121)
BUN/Creatinine Ratio: 19 (ref 9–23)
BUN: 14 mg/dL (ref 6–24)
Bilirubin Total: 0.5 mg/dL (ref 0.0–1.2)
CO2: 21 mmol/L (ref 20–29)
Calcium: 9.7 mg/dL (ref 8.7–10.2)
Chloride: 102 mmol/L (ref 96–106)
Creatinine, Ser: 0.73 mg/dL (ref 0.57–1.00)
GFR calc Af Amer: 117 mL/min/{1.73_m2} (ref >59)
GFR calc non Af Amer: 101 mL/min/{1.73_m2} (ref >59)
Globulin, Total: 2.9 g/dL (ref 1.5–4.5)
Glucose: 84 mg/dL (ref 65–99)
Potassium: 4.2 mmol/L (ref 3.5–5.2)
Sodium: 141 mmol/L (ref 134–144)
Total Protein: 7.2 g/dL (ref 6.0–8.5)

## 2019-11-08 LAB — CBC
Hematocrit: 41.5 % (ref 34.0–46.6)
Hemoglobin: 13.4 g/dL (ref 11.1–15.9)
MCH: 27 pg (ref 26.6–33.0)
MCHC: 32.3 g/dL (ref 31.5–35.7)
MCV: 84 fL (ref 79–97)
Platelets: 248 10*3/uL (ref 150–450)
RBC: 4.97 x10E6/uL (ref 3.77–5.28)
RDW: 13.7 % (ref 11.7–15.4)
WBC: 7.5 10*3/uL (ref 3.4–10.8)

## 2019-11-16 ENCOUNTER — Other Ambulatory Visit: Payer: Self-pay | Admitting: General Surgery

## 2019-11-16 ENCOUNTER — Other Ambulatory Visit (HOSPITAL_COMMUNITY): Payer: Self-pay | Admitting: General Surgery

## 2019-11-17 ENCOUNTER — Ambulatory Visit: Payer: 59 | Admitting: Internal Medicine

## 2019-11-28 ENCOUNTER — Encounter: Payer: Self-pay | Admitting: *Deleted

## 2019-11-28 DIAGNOSIS — N83209 Unspecified ovarian cyst, unspecified side: Secondary | ICD-10-CM | POA: Insufficient documentation

## 2019-11-28 DIAGNOSIS — F32A Depression, unspecified: Secondary | ICD-10-CM | POA: Insufficient documentation

## 2019-11-28 DIAGNOSIS — T7840XA Allergy, unspecified, initial encounter: Secondary | ICD-10-CM | POA: Insufficient documentation

## 2019-12-04 ENCOUNTER — Ambulatory Visit
Admission: RE | Admit: 2019-12-04 | Discharge: 2019-12-04 | Disposition: A | Discharge status: Home / Self Care | Payer: 59 | Source: Ambulatory Visit | Attending: Family Medicine | Admitting: Family Medicine

## 2019-12-04 VITALS — BP 127/90 | HR 67 | Temp 99.0°F | Resp 16

## 2019-12-04 DIAGNOSIS — J029 Acute pharyngitis, unspecified: Secondary | ICD-10-CM

## 2019-12-04 DIAGNOSIS — H66002 Acute suppurative otitis media without spontaneous rupture of ear drum, left ear: Secondary | ICD-10-CM | POA: Diagnosis not present

## 2019-12-04 MED ORDER — AMOXICILLIN-POT CLAVULANATE 875-125 MG PO TABS: 1.0000 | ORAL_TABLET | Freq: Two times a day (BID) | ORAL | 0 refills | Status: AC

## 2019-12-04 MED ORDER — FLUCONAZOLE 200 MG PO TABS: 200.0000 mg | ORAL_TABLET | Freq: Once | ORAL | 0 refills | Status: AC

## 2019-12-04 NOTE — ED Provider Notes (Signed)
[facility]   [id number] 12/04/19 Arrival Time: 7619  CC: EAR PAIN  SUBJECTIVE: History from: patient.  Cindy Gilbert is a 44 y.o. female who presents with of left ear pain for the last 4 days. Reports sore throat, cough and congestion as well. Has had 2 negative covid tests in the last week. Has not attempted OTC treatment. Symptoms are made worse with lying down. Denies similar symptoms in the past. Denies fever, chills, fatigue, sinus pain, rhinorrhea, ear discharge,  SOB, wheezing, chest pain, nausea, changes in bowel or bladder habits.    ROS: As per HPI.  All other pertinent ROS negative.     Past Medical History:  Diagnosis Date   Allergy    Seasonal   Anxiety 03/22/2018   Controlled substance agreement signed 12/18/2016   For xanax   Depression    previously on lexapro, celexa,    Dyslipidemia    Hip pain, chronic    Right   Insomnia    Morbid obesity ([place]) 10/15/2019   Multinodular goiter (nontoxic) 03/16/2019   Small nodules- no need for bx or follow up imaging 2020   Ovarian cyst    left   Right knee pain    Vitamin D deficiency    Past Surgical History:  Procedure Laterality Date   ADENOIDECTOMY     CESAREAN SECTION  2001   DILATION AND CURETTAGE OF UTERUS     TONSILLECTOMY     Allergies  Allergen Reactions   Ambien [Zolpidem Tartrate] Itching   Belviq [Lorcaserin] Itching   Bupropion Itching   Cymbalta [Duloxetine Hcl] Other (See Comments)    Headache   No current facility-administered medications on file prior to encounter.   Current Outpatient Medications on File Prior to Encounter  Medication Sig Dispense Refill   ALPRAZolam (XANAX) 0.5 MG tablet Take 0.5-2 tablets (0.25-1 mg total) by mouth 2 (two) times daily as needed for anxiety. 60 tablet 1   busPIRone (BUSPAR) 5 MG tablet Take 1 tablet (5 mg total) by mouth 3 (three) times daily. (Patient not taking: Reported on 07/28/2019) 90 tablet 3   fluticasone  (FLONASE) 50 MCG/ACT nasal spray Place 2 sprays into both nostrils daily. 16 g 12   Vitamin D, Ergocalciferol, (DRISDOL) 1.25 MG (50000 UNIT) CAPS capsule TAKE 1 CAPSULE (50,000 UNITS TOTAL) BY MOUTH EVERY 7 (SEVEN) DAYS. 12 capsule 2   Social History   Socioeconomic History   Marital status: Single    Spouse name: Not on file   Number of children: Not on file   Years of education: Not on file   Highest education level: Not on file  Occupational History   Not on file  Tobacco Use   Smoking status: Former Smoker    Packs/day: 0.25    Years: 10.00    Pack years: 2.50    Types: Cigarettes    Quit date: 02/23/2014    Years since quitting: 5.7   Smokeless tobacco: Never Used  Vaping Use   Vaping Use: Never used  Substance and Sexual Activity   Alcohol use: Yes    Alcohol/week: 6.0 standard drinks    Types: 6 Glasses of wine per week   Drug use: No   Sexual activity: Not Currently    Birth control/protection: None  Other Topics Concern   Not on file  Social History Narrative   Not on file   Social Determinants of Health   Financial Resource Strain:    Difficulty of Paying Living Expenses: Not  on file  Food Insecurity:    Worried About Charity fundraiser in the Last Year: Not on file   [employer] of Food in the Last Year: Not on file  Transportation Needs:    Lack of Transportation (Medical): Not on file   Lack of Transportation (Non-Medical): Not on file  Physical Activity:    Days of Exercise per Week: Not on file   Minutes of Exercise per Session: Not on file  Stress:    Feeling of Stress : Not on file  Social Connections:    Frequency of Communication with Friends and Family: Not on file   Frequency of Social Gatherings with Friends and Family: Not on file   Attends Religious Services: Not on file   Active Member of Clubs or Organizations: Not on file   Attends Archivist Meetings: Not on file   Marital Status: Not on file    Intimate Partner Violence:    Fear of Current or Ex-Partner: Not on file   Emotionally Abused: Not on file   Physically Abused: Not on file   Sexually Abused: Not on file   Family History  Problem Relation Age of Onset   Heart disease Father    Stroke Father    Heart attack Father    Hypertension Father    Atrial fibrillation Maternal Aunt    Cancer Paternal Aunt        Breast   Heart murmur Maternal Grandmother    Asthma Paternal Grandmother    Hypertension Mother    Hyperlipidemia Mother     OBJECTIVE:  Vitals:   12/04/19 1411  BP: 127/90  Pulse: 67  Resp: 16  Temp: 99 F (37.2 C)  TempSrc: Oral  SpO2: 98%     General appearance: alert; appears fatigued HEENT: Ears: EACs clear, R TM pearly gray with visible cone of light, without erythema, L TM erythematous, bulging, with effusion; Eyes: PERRL, EOMI grossly; Sinuses nontender to palpation; Nose: clear rhinorrhea; Throat: oropharynx mildly erythematous, tonsils 1+ without white tonsillar exudates, uvula midline Neck: supple without LAD Lungs: unlabored respirations, symmetrical air entry; cough: absent; no respiratory distress Heart: regular rate and rhythm.  Radial pulses 2+ symmetrical bilaterally Skin: warm and dry Psychological: alert and cooperative; normal mood and affect  Imaging: No results found.   ASSESSMENT & PLAN:  1. Non-recurrent acute suppurative otitis media of left ear without spontaneous rupture of tympanic membrane   2. Sore throat     Meds ordered this encounter  Medications   amoxicillin-clavulanate (AUGMENTIN) 875-125 MG tablet    Sig: Take 1 tablet by mouth 2 (two) times daily for 7 days.    Dispense:  14 tablet    Refill:  0    Order Specific Question:   Supervising Provider    Answer:   Chase Picket [[id number]]   fluconazole (DIFLUCAN) 200 MG tablet    Sig: Take 1 tablet (200 mg total) by mouth once for 1 dose.    Dispense:  2 tablet    Refill:  0    Order  Specific Question:   Supervising Provider    Answer:   Chase Picket [[id number]]    Rest and drink plenty of fluids Prescribed Augmentin Prescribed diflucan in case of yeast Take medications as directed and to completion Continue to use OTC ibuprofen and/ or tylenol as needed for pain control Follow up with PCP if symptoms persists Return here or go to the ER if you have  any new or worsening symptoms   Reviewed expectations re: course of current medical issues. Questions answered. Outlined signs and symptoms indicating need for more acute intervention. Patient verbalized understanding. After Visit Summary given.         Faustino Congress, NP 12/04/19 1436

## 2019-12-04 NOTE — Discharge Instructions (Signed)
I sent in Augmentin for you to take twice a day for 7 days.  I have also sent in fluconazole for you to take in case of yeast.  Take 1 tablet at the onset of symptoms, if symptoms are still present in 3 days, take the second tablet.  Follow-up with this office or with primary care if symptoms are persisting past 2 days after starting antibiotics  Follow-up with the ER for high fever, trouble swallowing, trouble breathing, other concerning symptoms

## 2019-12-04 NOTE — ED Triage Notes (Signed)
Patient reports sore throat, frontal head pressure, and left ear pain since Thursday. No fever. Positive for cough and nasal congestion.   Negative COVID test on Saturday at home. Last Monday had negative PCR.

## 2019-12-06 ENCOUNTER — Ambulatory Visit (INDEPENDENT_AMBULATORY_CARE_PROVIDER_SITE_OTHER): Payer: 59 | Admitting: Cardiology

## 2019-12-06 ENCOUNTER — Other Ambulatory Visit: Payer: Self-pay

## 2019-12-06 ENCOUNTER — Encounter: Payer: Self-pay | Admitting: Cardiology

## 2019-12-06 ENCOUNTER — Ambulatory Visit: Payer: 59 | Admitting: Cardiology

## 2019-12-06 ENCOUNTER — Ambulatory Visit (INDEPENDENT_AMBULATORY_CARE_PROVIDER_SITE_OTHER): Payer: 59

## 2019-12-06 VITALS — BP 110/80 | HR 87 | Ht 62.0 in | Wt 244.0 lb

## 2019-12-06 DIAGNOSIS — R002 Palpitations: Secondary | ICD-10-CM | POA: Diagnosis not present

## 2019-12-06 DIAGNOSIS — R011 Cardiac murmur, unspecified: Secondary | ICD-10-CM

## 2019-12-06 DIAGNOSIS — Z01818 Encounter for other preprocedural examination: Secondary | ICD-10-CM

## 2019-12-06 DIAGNOSIS — R0602 Shortness of breath: Secondary | ICD-10-CM

## 2019-12-06 DIAGNOSIS — E782 Mixed hyperlipidemia: Secondary | ICD-10-CM

## 2019-12-06 HISTORY — DX: Mixed hyperlipidemia: E78.2

## 2019-12-06 NOTE — Progress Notes (Signed)
Cardiology Office Note:    Date:  12/06/2019   ID:  Cindy Gilbert, DOB 1976/02/05, MRN [id number]  PCP:  [name], DO  Cardiologist:  Berniece Salines, DO  Electrophysiologist:  None   Referring MD: [name], MD   Chief Complaint  Patient presents with  . Pre-op Exam    Bariatric    History of Present Illness:    Cindy Gilbert is a 44 y.o. female with a hx of hyperlipidemia, morbid obesity, no history of premature coronary artery disease anxiety and depression presents today to be evaluated preoperatively.  Patient tells me that she has been experiencing intermittent palpitations along with any shortness of breath on exertion.  She is concerned.  She was able to walk up flights of stairs as well as do other things without of breath.  But recently the shortness of breath has been getting progressively worse.  She is concerned about this.  She has not had any chest pain.    Past Medical History:  Diagnosis Date  . Allergy    Seasonal  . Anxiety 03/22/2018  . Controlled substance agreement signed 12/18/2016   For xanax  . Depression    previously on lexapro, celexa,   . Dyslipidemia   . Hip pain, chronic    Right  . Insomnia   . Morbid obesity ([place]) 10/15/2019  . Multinodular goiter (nontoxic) 03/16/2019   Small nodules- no need for bx or follow up imaging 2020  . Ovarian cyst    left  . Right knee pain   . Vitamin D deficiency     Past Surgical History:  Procedure Laterality Date  . ADENOIDECTOMY    . CESAREAN SECTION  2001  . DILATION AND CURETTAGE OF UTERUS    . TONSILLECTOMY      Current Medications: Current Meds  Medication Sig  . ALPRAZolam (XANAX) 0.5 MG tablet Take 0.5-2 tablets (0.25-1 mg total) by mouth 2 (two) times daily as needed for anxiety.  Marland Kitchen amoxicillin-clavulanate (AUGMENTIN) 875-125 MG tablet Take 1 tablet by mouth 2 (two) times daily for 7 days.  . fluticasone (FLONASE) 50 MCG/ACT nasal spray Place 2 sprays into both nostrils  daily.  . Vitamin D, Ergocalciferol, (DRISDOL) 1.25 MG (50000 UNIT) CAPS capsule TAKE 1 CAPSULE (50,000 UNITS TOTAL) BY MOUTH EVERY 7 (SEVEN) DAYS.     Allergies:   Ambien [zolpidem tartrate], Belviq [lorcaserin], Bupropion, and Cymbalta [duloxetine hcl]   Social History   Socioeconomic History  . Marital status: Single    Spouse name: Not on file  . Number of children: Not on file  . Years of education: Not on file  . Highest education level: Not on file  Occupational History  . Not on file  Tobacco Use  . Smoking status: Former Smoker    Packs/day: 0.25    Years: 10.00    Pack years: 2.50    Types: Cigarettes    Quit date: 02/23/2014    Years since quitting: 5.7  . Smokeless tobacco: Never Used  Vaping Use  . Vaping Use: Never used  Substance and Sexual Activity  . Alcohol use: Yes    Alcohol/week: 6.0 standard drinks    Types: 6 Glasses of wine per week  . Drug use: No  . Sexual activity: Not Currently    Birth control/protection: None  Other Topics Concern  . Not on file  Social History Narrative  . Not on file   Social Determinants of Health   Financial Resource Strain:   .  Difficulty of Paying Living Expenses: Not on file  Food Insecurity:   . Worried About Charity fundraiser in the Last Year: Not on file  . Ran Out of Food in the Last Year: Not on file  Transportation Needs:   . Lack of Transportation (Medical): Not on file  . Lack of Transportation (Non-Medical): Not on file  Physical Activity:   . Days of Exercise per Week: Not on file  . Minutes of Exercise per Session: Not on file  Stress:   . Feeling of Stress : Not on file  Social Connections:   . Frequency of Communication with Friends and Family: Not on file  . Frequency of Social Gatherings with Friends and Family: Not on file  . Attends Religious Services: Not on file  . Active Member of Clubs or Organizations: Not on file  . Attends Archivist Meetings: Not on file  . Marital  Status: Not on file     Family History: The patient's family history includes Asthma in her paternal grandmother; Atrial fibrillation in her maternal aunt; Cancer in her paternal aunt; Heart attack in her father; Heart disease in her father; Heart murmur in her maternal grandmother; Hyperlipidemia in her mother; Hypertension in her father and mother; Stroke in her father.  ROS:   Review of Systems  Constitution: Negative for decreased appetite, fever and weight gain.  HENT: Negative for congestion, ear discharge, hoarse voice and sore throat.   Eyes: Negative for discharge, redness, vision loss in right eye and visual halos.  Cardiovascular: Negative for chest pain, dyspnea on exertion, leg swelling, orthopnea and palpitations.  Respiratory: Negative for cough, hemoptysis, shortness of breath and snoring.   Endocrine: Negative for heat intolerance and polyphagia.  Hematologic/Lymphatic: Negative for bleeding problem. Does not bruise/bleed easily.  Skin: Negative for flushing, nail changes, rash and suspicious lesions.  Musculoskeletal: Negative for arthritis, joint pain, muscle cramps, myalgias, neck pain and stiffness.  Gastrointestinal: Negative for abdominal pain, bowel incontinence, diarrhea and excessive appetite.  Genitourinary: Negative for decreased libido, genital sores and incomplete emptying.  Neurological: Negative for brief paralysis, focal weakness, headaches and loss of balance.  Psychiatric/Behavioral: Negative for altered mental status, depression and suicidal ideas.  Allergic/Immunologic: Negative for HIV exposure and persistent infections.    EKGs/Labs/Other Studies Reviewed:    The following studies were reviewed today:   EKG:  The ekg ordered today demonstrates sinus rhythm, heart rate 87 bpm no prior EKG for comparison.  Recent Labs: 01/23/2019: TSH 1.490 11/07/2019: ALT 21; BUN 14; Creatinine, Ser 0.73; Hemoglobin 13.4; Platelets 248; Potassium 4.2; Sodium 141    Recent Lipid Panel    Component Value Date/Time   CHOL 220 (H) 01/23/2019 1453   TRIG 61 01/23/2019 1453   HDL 73 01/23/2019 1453   LDLCALC 136 (H) 01/23/2019 1453    Physical Exam:    VS:  BP 110/80 (BP Location: Right Arm, Patient Position: Sitting, Cuff Size: Large)   Pulse 87   Ht 5' 2"  (1.575 m)   Wt 244 lb (110.7 kg)   SpO2 96%   BMI 44.63 kg/m     Wt Readings from Last 3 Encounters:  12/06/19 244 lb (110.7 kg)  10/12/19 242 lb 4.8 oz (109.9 kg)  07/08/18 202 lb 2 oz (91.7 kg)     GEN: Well nourished, well developed in no acute distress HEENT: Normal NECK: No JVD; No carotid bruits LYMPHATICS: No lymphadenopathy CARDIAC: S1S2 noted,RRR, 2/6 systolic murmurs, rubs, gallops RESPIRATORY:  Clear to auscultation without rales, wheezing or rhonchi  ABDOMEN: Soft, non-tender, non-distended, +bowel sounds, no guarding. EXTREMITIES: No edema, No cyanosis, no clubbing MUSCULOSKELETAL:  No deformity  SKIN: Warm and dry NEUROLOGIC:  Alert and oriented x 3, non-focal PSYCHIATRIC:  Normal affect, good insight  ASSESSMENT:    1. Shortness of breath   2. Pre-op evaluation   3. Palpitations   4. Mixed hyperlipidemia   5. Morbid obesity ([place])   6. Systolic murmur    PLAN:    Her shortness of breath is concerning given the fact that she is preoperatively with family history and risk factors I like to proceed with an ischemic evaluation a stress echo will be appropriate at this time.  I have educated the patient about the stress echo she is agreeable to proceed.  In addition she has had some palpitations, I would like to rule out a cardiovascular etiology of this palpitation, therefore at this time I would like to placed a zio patch for   3 days.   She does have a very subtle systolic murmur which could be physiologic, but she is getting a stress echocardiogram will be able to understand her baseline echo to see if there is any structural abnormalities.    The patient  understands the need to lose weight with diet and exercise. We have discussed specific strategies for this.  She is planning surgery in the upcoming months.  LDL 136 should continue with diet modification for now.  The patient is in agreement with the above plan. The patient left the office in stable condition.  The patient will follow up in 1 month or sooner if needed.   Medication Adjustments/Labs and Tests Ordered: Current medicines are reviewed at length with the patient today.  Concerns regarding medicines are outlined above.  Orders Placed This Encounter  Procedures  . LONG TERM MONITOR (3-14 DAYS)  . EKG 12-Lead  . ECHOCARDIOGRAM STRESS TEST   No orders of the defined types were placed in this encounter.   Patient Instructions  Medication Instructions:  Your physician recommends that you continue on your current medications as directed. Please refer to the Current Medication list given to you today.  *If you need a refill on your cardiac medications before your next appointment, please call your pharmacy*   Lab Work: None. If you have labs (blood work) drawn today and your tests are completely normal, you will receive your results only by: Marland Kitchen MyChart Message (if you have MyChart) OR . A paper copy in the mail If you have any lab test that is abnormal or we need to change your treatment, we will call you to review the results.   Testing/Procedures: A zio monitor was ordered today. It will remain on for 3 days. You will then return monitor and event diary in provided box. It takes 1-2 weeks for report to be downloaded and returned to [place]. We will call you with the results. If monitor falls off or has orange flashing light, please call Zio for further instructions.   You will have a stress echo.  -Do not use tobacco products for fours hours before test. -Be prepared to exercise -Bring medications with you.  -Notify office within 24 hours if you need to cancel  appointment.   Follow-Up: At [facility], you and your health needs are our priority.  As part of our continuing mission to provide you with exceptional heart care, we have created designated Provider Care Teams.  These Care  Teams include your primary Cardiologist (physician) and Advanced Practice Providers (APPs -  Physician Assistants and Nurse Practitioners) who all work together to provide you with the care you need, when you need it.  We recommend signing up for the patient portal called "MyChart".  Sign up information is provided on this After Visit Summary.  MyChart is used to connect with patients for Virtual Visits (Telemedicine).  Patients are able to view lab/test results, encounter notes, upcoming appointments, etc.  Non-urgent messages can be sent to your provider as well.   To learn more about what you can do with MyChart, go to [email or web address].    Your next appointment:   1 month(s)  The format for your next appointment:   In Person  Provider:   Berniece Salines, DO   Other Instructions       Adopting a Healthy Lifestyle.  Know what a healthy weight is for you (roughly BMI <25) and aim to maintain this   Aim for 7+ servings of fruits and vegetables daily   65-80+ fluid ounces of water or unsweet tea for healthy kidneys   Limit to max 1 drink of alcohol per day; avoid smoking/tobacco   Limit animal fats in diet for cholesterol and heart health - choose grass fed whenever available   Avoid highly processed foods, and foods high in saturated/trans fats   Aim for low stress - take time to unwind and care for your mental health   Aim for 150 min of moderate intensity exercise weekly for heart health, and weights twice weekly for bone health   Aim for 7-9 hours of sleep daily   When it comes to diets, agreement about the perfect plan isnt easy to find, even among the experts. Experts at the [employer] developed an idea known as the  Healthy Eating Plate. Just imagine a plate divided into logical, healthy portions.   The emphasis is on diet quality:   Load up on vegetables and fruits - one-half of your plate: Aim for color and variety, and remember that potatoes dont count.   Go for whole grains - one-quarter of your plate: Whole wheat, barley, wheat berries, quinoa, oats, brown rice, and foods made with them. If you want pasta, go with whole wheat pasta.   Protein power - one-quarter of your plate: Fish, chicken, beans, and nuts are all healthy, versatile protein sources. Limit red meat.   The diet, however, does go beyond the plate, offering a few other suggestions.   Use healthy plant oils, such as olive, canola, soy, corn, sunflower and peanut. Check the labels, and avoid partially hydrogenated oil, which have unhealthy trans fats.   If youre thirsty, drink water. Coffee and tea are good in moderation, but skip sugary drinks and limit milk and dairy products to one or two daily servings.   The type of carbohydrate in the diet is more important than the amount. Some sources of carbohydrates, such as vegetables, fruits, whole grains, and beans-are healthier than others.   Finally, stay active  Signed, Berniece Salines, DO  12/06/2019 4:12 PM    [facility]

## 2019-12-06 NOTE — Patient Instructions (Signed)
Medication Instructions:  Your physician recommends that you continue on your current medications as directed. Please refer to the Current Medication list given to you today.  *If you need a refill on your cardiac medications before your next appointment, please call your pharmacy*   Lab Work: None. If you have labs (blood work) drawn today and your tests are completely normal, you will receive your results only by: Marland Kitchen MyChart Message (if you have MyChart) OR . A paper copy in the mail If you have any lab test that is abnormal or we need to change your treatment, we will call you to review the results.   Testing/Procedures: A zio monitor was ordered today. It will remain on for 3 days. You will then return monitor and event diary in provided box. It takes 1-2 weeks for report to be downloaded and returned to [place]. We will call you with the results. If monitor falls off or has orange flashing light, please call Zio for further instructions.   You will have a stress echo.  -Do not use tobacco products for fours hours before test. -Be prepared to exercise -Bring medications with you.  -Notify office within 24 hours if you need to cancel appointment.   Follow-Up: At [facility], you and your health needs are our priority.  As part of our continuing mission to provide you with exceptional heart care, we have created designated Provider Care Teams.  These Care Teams include your primary Cardiologist (physician) and Advanced Practice Providers (APPs -  Physician Assistants and Nurse Practitioners) who all work together to provide you with the care you need, when you need it.  We recommend signing up for the patient portal called "MyChart".  Sign up information is provided on this After Visit Summary.  MyChart is used to connect with patients for Virtual Visits (Telemedicine).  Patients are able to view lab/test results, encounter notes, upcoming appointments, etc.  Non-urgent messages can be sent  to your provider as well.   To learn more about what you can do with MyChart, go to [email or web address].    Your next appointment:   1 month(s)  The format for your next appointment:   In Person  Provider:   Berniece Salines, DO   Other Instructions

## 2019-12-07 ENCOUNTER — Ambulatory Visit (HOSPITAL_COMMUNITY)
Admission: RE | Admit: 2019-12-07 | Discharge: 2019-12-07 | Disposition: A | Discharge status: Home / Self Care | Payer: 59 | Source: Ambulatory Visit | Attending: General Surgery | Admitting: General Surgery

## 2019-12-18 ENCOUNTER — Ambulatory Visit: Payer: 59 | Admitting: Skilled Nursing Facility1

## 2019-12-19 ENCOUNTER — Ambulatory Visit: Payer: 59 | Admitting: Skilled Nursing Facility1

## 2019-12-20 ENCOUNTER — Telehealth (HOSPITAL_COMMUNITY): Payer: Self-pay | Admitting: *Deleted

## 2019-12-20 NOTE — Telephone Encounter (Signed)
Patient given detailed instructions per Stress Test Requisition Sheet for test on 12/27/2019 at 1400.Patient Notified to arrive 30 minutes early, and that it is imperative to arrive on time for appointment to keep from having the test rescheduled.  Patient verbalized understanding. Fischer Halley, Ranae [place]ms

## 2019-12-23 ENCOUNTER — Inpatient Hospital Stay (HOSPITAL_COMMUNITY)
Admission: RE | Admit: 2019-12-23 | Discharge: 2019-12-23 | Disposition: A | Discharge status: Home / Self Care | Payer: 59 | Source: Ambulatory Visit

## 2019-12-23 NOTE — Progress Notes (Signed)
Patient stated she tested positive for COVID on October 05, 2019. No need to test patient at this time per anesthesia protocol. Patient to provide proof of positive test on DOS.

## 2019-12-27 ENCOUNTER — Encounter (HOSPITAL_COMMUNITY): Payer: 59 | Attending: Cardiovascular Disease

## 2019-12-27 ENCOUNTER — Ambulatory Visit (HOSPITAL_COMMUNITY): Payer: 59 | Attending: Cardiovascular Disease

## 2019-12-27 ENCOUNTER — Other Ambulatory Visit: Payer: Self-pay

## 2019-12-27 DIAGNOSIS — R002 Palpitations: Secondary | ICD-10-CM | POA: Insufficient documentation

## 2019-12-27 DIAGNOSIS — R0602 Shortness of breath: Secondary | ICD-10-CM | POA: Diagnosis present

## 2019-12-27 DIAGNOSIS — Z01818 Encounter for other preprocedural examination: Secondary | ICD-10-CM | POA: Insufficient documentation

## 2019-12-27 DIAGNOSIS — R011 Cardiac murmur, unspecified: Secondary | ICD-10-CM

## 2019-12-27 HISTORY — DX: Cardiac murmur, unspecified: R01.1

## 2019-12-27 LAB — ECHOCARDIOGRAM STRESS TEST
Area-P 1/2: 2.91 cm2
S' Lateral: 3 cm

## 2019-12-28 ENCOUNTER — Encounter: Payer: 59 | Attending: General Surgery | Admitting: Dietician

## 2019-12-28 ENCOUNTER — Telehealth: Payer: Self-pay

## 2019-12-28 ENCOUNTER — Other Ambulatory Visit: Payer: Self-pay

## 2019-12-28 ENCOUNTER — Encounter: Payer: Self-pay | Admitting: Dietician

## 2019-12-28 VITALS — Ht 63.0 in | Wt 242.9 lb

## 2019-12-28 DIAGNOSIS — Z6841 Body Mass Index (BMI) 40.0 and over, adult: Secondary | ICD-10-CM | POA: Insufficient documentation

## 2019-12-28 DIAGNOSIS — M25561 Pain in right knee: Secondary | ICD-10-CM | POA: Insufficient documentation

## 2019-12-28 DIAGNOSIS — Z8349 Family history of other endocrine, nutritional and metabolic diseases: Secondary | ICD-10-CM | POA: Diagnosis not present

## 2019-12-28 DIAGNOSIS — E785 Hyperlipidemia, unspecified: Secondary | ICD-10-CM | POA: Diagnosis not present

## 2019-12-28 DIAGNOSIS — M25551 Pain in right hip: Secondary | ICD-10-CM | POA: Diagnosis not present

## 2019-12-28 NOTE — Progress Notes (Signed)
Nutrition Assessment for Bariatric Surgery   Appointment start time: 0910   end time: 1010  Planned Surgery: Sleeve gastrectomy  Anthropometrics: Weight: 242.9lbs (with shoes)    Height: 5'3"   BMI: 43   Patient's Goal Weight: 165lbs  Clinical: Medical History: hypercholesterolemia, anxiety Medications and Supplements: ALPRAZolam, fluticasone, Vitamin D weekly Relevant labs: total cholesterol 220, LDL 136, HDL 73, triglycerides 61 01/23/19 Notable symptoms: back and abdominal pain; patient feels due to excess weight Drug allergies: ambien, belviq, bupropion, cymbalta (adverse reaction) Food allergies: none known  Lifestyle and Dietary History:  Patient reports multiple periods of dieting and weight loss followed by regain.  She is working to [employer] and limit emotional eating.   Dieting/ weight history: has tried keto diet, weight watchers, cabbage soup diet, 3day [employer] diet, low carb high protein which worked best-- was able to lose weight to goal of 165, also worked with Physiological scientist. Weight gradually increased afterwards   Disordered or emotional eating history: no ed, some stress and boredom eating  Physical activity: yoga 45-60 minutes, 3-6 times a week  Dietary Recall:  Daily pattern: 1-2 meals and 0 snacks. Dining out: 3-4 meals per week. Breakfast: coffee  Snack: none Lunch: pm leftovers or frozen microwave meal Snack: none Supper: sometimes skips depending on time -- fish + rice or veg; pizza; takeout if nothing planned Snack: none Beverages: water, coffee in am, 1 glass wine  daily (5 per week); no sodas or tea   Nutrition Intervention:  Instructed patient on pre-op diet goals and importance of close adherence to bariatric diet after surgery to avoid side effects and complications.   Discussed stages of the bariatric diet after surgery as well as the importance of adequate protein and fluid intake.   Provided overview of 2-week pre-op diet.    Discussed importance of long-term support with weight loss journey and healthy lifestyle habits.   Nutrition Diagnosis: Dixon-3.3 Overweight/ obesity related to history of excess calories and inadequate physical activity as evidenced by patient with current BMI of 43, working on diet and lifestyle changes prior to bariatric surgery.  Teaching method(s) utilized: Copy provided: . Pre-op Goals . Diet Stage Template . Visit summary with goals and instructions  Learning Readiness: Ready for change  Barriers to learning/ implementing lifestyle change: none  Demonstrated degree of understanding via: Teach Back  Summary:  Patient has begun making diet and lifestyle changes in effort to lose weight and prepare for bariatric surgery, and is motivated to continue.   She has researched information about bariatric surgery.   Patient's goal for weight loss is reducing health risk and improving pain.  She has solid support from family and friends.   She agrees to work on strategies to avoid emotional eating and pre-planning balanced meals prior to surgery. She will begin looking for suitable protein shakes to consume during pre-op diet and after surgery.  She is motivated to follow the bariatric diet after surgery.   From a nutrition standpoint, she is ready to proceed with the bariatric surgery program.    Plan:  Patient commits to returning for  Pre-op class prior to surgery.   She will plan to return for post-op RD visits beginning 2 weeks after surgery.

## 2019-12-28 NOTE — Telephone Encounter (Signed)
Left a message informing the patient that Dr. Harriet Masson would review her recent results at her OV tomorrow.

## 2019-12-28 NOTE — Patient Instructions (Signed)
   Use time on weekends to plan and pre-prep meals for the week. Choose low fat and low sugar options, concentrating on lean proteins and low-carb vegetables.   Investigate options for protein drinks.  Make a list of non-food related activities

## 2019-12-29 ENCOUNTER — Encounter: Payer: Self-pay | Admitting: Cardiology

## 2019-12-29 ENCOUNTER — Ambulatory Visit: Payer: 59 | Admitting: Family Medicine

## 2019-12-29 ENCOUNTER — Other Ambulatory Visit: Payer: Self-pay

## 2019-12-29 ENCOUNTER — Ambulatory Visit (INDEPENDENT_AMBULATORY_CARE_PROVIDER_SITE_OTHER): Payer: 59 | Admitting: Cardiology

## 2019-12-29 VITALS — BP 132/84 | HR 95 | Ht 63.0 in | Wt 249.0 lb

## 2019-12-29 DIAGNOSIS — R9439 Abnormal result of other cardiovascular function study: Secondary | ICD-10-CM

## 2019-12-29 DIAGNOSIS — E782 Mixed hyperlipidemia: Secondary | ICD-10-CM

## 2019-12-29 MED ORDER — METOPROLOL TARTRATE 100 MG PO TABS: 100.0000 mg | ORAL_TABLET | Freq: Once | ORAL | 0 refills | Status: DC

## 2019-12-29 NOTE — Progress Notes (Signed)
Cardiology Office Note:    Date:  12/30/2019   ID:  Cindy Gilbert, DOB October 26, 1975, MRN [id number]  PCP:  [name], DO  Cardiologist:  Berniece Salines, DO  Electrophysiologist:  None   Referring MD: [name], DO     History of Present Illness:    Cindy Gilbert is a 44 y.o. female with a hx of hyperlipidemia, morbid obesity, presented to be evaluated preoperatively reported shortness of breath.  At that time significant palpitations.  She did wear the monitor we did not show any arrhythmia.  She had a stress echocardiogram which was reported to be positive patient is here today to discuss these results.  Past Medical History:  Diagnosis Date  . Allergy    Seasonal  . Anxiety 03/22/2018  . Controlled substance agreement signed 12/18/2016   For xanax  . Depression    previously on lexapro, celexa,   . Dyslipidemia   . Hip pain, chronic    Right  . Insomnia   . Morbid obesity ([place]) 10/15/2019  . Multinodular goiter (nontoxic) 03/16/2019   Small nodules- no need for bx or follow up imaging 2020  . Ovarian cyst    left  . Right knee pain   . Vitamin D deficiency     Past Surgical History:  Procedure Laterality Date  . ADENOIDECTOMY    . CESAREAN SECTION  2001  . DILATION AND CURETTAGE OF UTERUS    . TONSILLECTOMY      Current Medications: Current Meds  Medication Sig  . ALPRAZolam (XANAX) 0.5 MG tablet Take 0.5-2 tablets (0.25-1 mg total) by mouth 2 (two) times daily as needed for anxiety.  . fluconazole (DIFLUCAN) 200 MG tablet Take 200 mg by mouth daily.  . fluticasone (FLONASE) 50 MCG/ACT nasal spray Place 2 sprays into both nostrils daily.  . Vitamin D, Ergocalciferol, (DRISDOL) 1.25 MG (50000 UNIT) CAPS capsule TAKE 1 CAPSULE (50,000 UNITS TOTAL) BY MOUTH EVERY 7 (SEVEN) DAYS.     Allergies:   Ambien [zolpidem tartrate], Belviq [lorcaserin], Bupropion, and Cymbalta [duloxetine hcl]   Social History   Socioeconomic History  . Marital  status: Single    Spouse name: Not on file  . Number of children: Not on file  . Years of education: Not on file  . Highest education level: Not on file  Occupational History  . Not on file  Tobacco Use  . Smoking status: Former Smoker    Packs/day: 0.25    Years: 10.00    Pack years: 2.50    Types: Cigarettes    Quit date: 02/23/2014    Years since quitting: 5.8  . Smokeless tobacco: Never Used  Vaping Use  . Vaping Use: Never used  Substance and Sexual Activity  . Alcohol use: Yes    Alcohol/week: 5.0 standard drinks    Types: 5 Glasses of wine per week  . Drug use: No  . Sexual activity: Not Currently    Birth control/protection: None  Other Topics Concern  . Not on file  Social History Narrative  . Not on file   Social Determinants of Health   Financial Resource Strain:   . Difficulty of Paying Living Expenses: Not on file  Food Insecurity:   . Worried About Charity fundraiser in the Last Year: Not on file  . Ran Out of Food in the Last Year: Not on file  Transportation Needs:   . Lack of Transportation (Medical): Not on file  . Lack of  Transportation (Non-Medical): Not on file  Physical Activity:   . Days of Exercise per Week: Not on file  . Minutes of Exercise per Session: Not on file  Stress:   . Feeling of Stress : Not on file  Social Connections:   . Frequency of Communication with Friends and Family: Not on file  . Frequency of Social Gatherings with Friends and Family: Not on file  . Attends Religious Services: Not on file  . Active Member of Clubs or Organizations: Not on file  . Attends Archivist Meetings: Not on file  . Marital Status: Not on file     Family History: The patient's family history includes Asthma in her paternal grandmother; Atrial fibrillation in her maternal aunt; Cancer in her paternal aunt; Heart attack in her father; Heart disease in her father; Heart murmur in her maternal grandmother; Hyperlipidemia in her mother;  Hypertension in her father and mother; Stroke in her father.  ROS:   Review of Systems  Constitution: Negative for decreased appetite, fever and weight gain.  HENT: Negative for congestion, ear discharge, hoarse voice and sore throat.   Eyes: Negative for discharge, redness, vision loss in right eye and visual halos.  Cardiovascular: Negative for chest pain, dyspnea on exertion, leg swelling, orthopnea and palpitations.  Respiratory: Negative for cough, hemoptysis, shortness of breath and snoring.   Endocrine: Negative for heat intolerance and polyphagia.  Hematologic/Lymphatic: Negative for bleeding problem. Does not bruise/bleed easily.  Skin: Negative for flushing, nail changes, rash and suspicious lesions.  Musculoskeletal: Negative for arthritis, joint pain, muscle cramps, myalgias, neck pain and stiffness.  Gastrointestinal: Negative for abdominal pain, bowel incontinence, diarrhea and excessive appetite.  Genitourinary: Negative for decreased libido, genital sores and incomplete emptying.  Neurological: Negative for brief paralysis, focal weakness, headaches and loss of balance.  Psychiatric/Behavioral: Negative for altered mental status, depression and suicidal ideas.  Allergic/Immunologic: Negative for HIV exposure and persistent infections.    EKGs/Labs/Other Studies Reviewed:    The following studies were reviewed today:   EKG:  The ekg ordered today demonstrates   Zio Monitor  The patient wore the monitor for 2 days 21 hours starting 12/06/2019 . Indication: Palpitations  The minimum heart rate was 56 bpm, maximum heart rate was 143 bpm, and average heart rate was 90 bpm. Predominant underlying rhythm was Sinus Rhythm.  Premature atrial complexes were less than 1%. Premature Ventricular complexes were less than 1%.  No pauses, No AV block and no atrial fibrillation present. 1 patient triggered events associated with sinus rhythm.   Conclusion:  Normal/unremarkable study with no evidence of arrhythmia.   Stress echo IMPRESSIONS  1. This is a positive stress echocardiogram for ischemia (see scoring  diagram/findings for description), in the distribution of the left  circumflex artery. Ischemia at a higher heart rate cannot be excluded.  2. This is an intermediate risk study.   Recent Labs: 01/23/2019: TSH 1.490 11/07/2019: ALT 21; BUN 14; Creatinine, Ser 0.73; Hemoglobin 13.4; Platelets 248; Potassium 4.2; Sodium 141  Recent Lipid Panel    Component Value Date/Time   CHOL 220 (H) 01/23/2019 1453   TRIG 61 01/23/2019 1453   HDL 73 01/23/2019 1453   LDLCALC 136 (H) 01/23/2019 1453    Physical Exam:    VS:  BP 132/84   Pulse 95   Ht 5' 3"  (1.6 m)   Wt 249 lb (112.9 kg)   SpO2 98%   BMI 44.11 kg/m     Wt Readings  from Last 3 Encounters:  12/29/19 249 lb (112.9 kg)  12/28/19 242 lb 14.4 oz (110.2 kg)  12/06/19 244 lb (110.7 kg)     GEN: Well nourished, well developed in no acute distress HEENT: Normal NECK: No JVD; No carotid bruits LYMPHATICS: No lymphadenopathy CARDIAC: S1S2 noted,RRR, no murmurs, rubs, gallops RESPIRATORY:  Clear to auscultation without rales, wheezing or rhonchi  ABDOMEN: Soft, non-tender, non-distended, +bowel sounds, no guarding. EXTREMITIES: No edema, No cyanosis, no clubbing MUSCULOSKELETAL:  No deformity  SKIN: Warm and dry NEUROLOGIC:  Alert and oriented x 3, non-focal PSYCHIATRIC:  Normal affect, good insight  ASSESSMENT:    1. Abnormal stress test   2. Morbid obesity ([place])   3. Mixed hyperlipidemia    PLAN:     We discussed her stress echo result.  Her stress echocardiogram is abnormal which require further testing.  We discussed the various possibility for her further testing.  Left heart catheterization versus coronary CTA.  Shared decision coronary CTA noninvasive testing will be pursued.  Educated patient about this testing.  She has no IV contrast dye allergy.  The  patient understands the need to lose weight with diet and exercise. We have discussed specific strategies for this.  The patient is in agreement with the above plan. The patient left the office in stable condition.  The patient will follow up in 4 months or sooner if needed.   Medication Adjustments/Labs and Tests Ordered: Current medicines are reviewed at length with the patient today.  Concerns regarding medicines are outlined above.  Orders Placed This Encounter  Procedures  . CT CORONARY MORPH W/CTA COR W/SCORE W/CA W/CM &/OR WO/CM  . CT CORONARY FRACTIONAL FLOW RESERVE DATA PREP  . CT CORONARY FRACTIONAL FLOW RESERVE FLUID ANALYSIS   Meds ordered this encounter  Medications  . metoprolol tartrate (LOPRESSOR) 100 MG tablet    Sig: Take 1 tablet (100 mg total) by mouth once for 1 dose. Take 2 hours of prior to cardiac testing    Dispense:  1 tablet    Refill:  0    Patient Instructions  Medication Instructions:  *If you need a refill on your cardiac medications before your next appointment, please call your pharmacy*  Testing/Procedures: Your physician has requested that you have Cardiac CTA. Coronary computed tomography angiography (CCTA) uses an injection of iodine-containing contrast material and CT scanning to examine the arteries that supply blood to the heart and determine whether they have been narrowed. The images generated during a CT scan can be reformatted to create three-dimensional (3D) images that may be viewed on a monitor, printed on film or by a 3D printer, or transferred to electronic media.  Follow-Up: At [facility], you and your health needs are our priority.  As part of our continuing mission to provide you with exceptional heart care, we have created designated Provider Care Teams.  These Care Teams include your primary Cardiologist (physician) and Advanced Practice Providers (APPs -  Physician Assistants and Nurse Practitioners) who all work together to  provide you with the care you need, when you need it.  We recommend signing up for the patient portal called "MyChart".  Sign up information is provided on this After Visit Summary.  MyChart is used to connect with patients for Virtual Visits (Telemedicine).  Patients are able to view lab/test results, encounter notes, upcoming appointments, etc.  Non-urgent messages can be sent to your provider as well.   To learn more about what you can do with  MyChart, go to [email or web address].    Your next appointment:   Your physician recommends that you schedule a follow-up appointment in: 4 MONTHS with Dr. Harriet Masson.  The format for your next appointment:   In Person with Berniece Salines, DO  ----------------------------------Your cardiac CT will be scheduled at one of the below locations: ------------------------  [facility] [address] [place], [place] [address] [phone]  If scheduled at [facility], please arrive at the [facility] main entrance of [facility] 30 minutes prior to test start time. Proceed to the [facility] Radiology Department (first floor) to check-in and test prep.  If scheduled at [facility], please arrive 15 mins early for check-in and test prep.  Please follow these instructions carefully (unless otherwise directed):  On the Night Before the Test: . Be sure to Drink plenty of water. . Do not consume any caffeinated/decaffeinated beverages or chocolate 12 hours prior to your test. . Do not take any antihistamines 12 hours prior to your test.  On the Day of the Test: . Drink plenty of water. Do not drink any water within one hour of the test. . Do not eat any food 4 hours prior to the test. . You may take your regular medications prior to the test.  . Take Metoprolol (Lopressor) two hours prior to test. Take 1 tablet (100 mg) by mouth -- RX SENT IN . FEMALES- please wear underwire-free bra if available      After the Test: . Drink plenty of water. . After receiving IV contrast, you may experience a mild flushed feeling. This is normal. . On occasion, you may experience a mild rash up to 24 hours after the test. This is not dangerous. If this occurs, you can take Benadryl 25 mg and increase your fluid intake. . If you experience trouble breathing, this can be serious. If it is severe call 911 IMMEDIATELY. If it is mild, please call our office.   Once we have confirmed authorization from your insurance company, we will call you to set up a date and time for your test. Based on how quickly your insurance processes prior authorizations requests, please allow up to 4 weeks to be contacted for scheduling your Cardiac CT appointment. Be advised that routine Cardiac CT appointments could be scheduled as many as 8 weeks after your provider has ordered it.  For non-scheduling related questions, please contact the cardiac imaging nurse navigator should you have any questions/concerns: Marchia Bond, Cardiac Imaging Nurse Navigator Burley Saver, Interim Cardiac Imaging Nurse [place] and Vascular Services Direct Office Dial: [phone]   For scheduling needs, including cancellations and rescheduling, please call Vivien Rota at [phone], option 3.        Adopting a Healthy Lifestyle.  Know what a healthy weight is for you (roughly BMI <25) and aim to maintain this   Aim for 7+ servings of fruits and vegetables daily   65-80+ fluid ounces of water or unsweet tea for healthy kidneys   Limit to max 1 drink of alcohol per day; avoid smoking/tobacco   Limit animal fats in diet for cholesterol and heart health - choose grass fed whenever available   Avoid highly processed foods, and foods high in saturated/trans fats   Aim for low stress - take time to unwind and care for your mental health   Aim for 150 min of moderate intensity exercise weekly for heart health, and weights twice  weekly for bone  health   Aim for 7-9 hours of sleep daily   When it comes to diets, agreement about the perfect plan isnt easy to find, even among the experts. Experts at the [employer] developed an idea known as the Healthy Eating Plate. Just imagine a plate divided into logical, healthy portions.   The emphasis is on diet quality:   Load up on vegetables and fruits - one-half of your plate: Aim for color and variety, and remember that potatoes dont count.   Go for whole grains - one-quarter of your plate: Whole wheat, barley, wheat berries, quinoa, oats, brown rice, and foods made with them. If you want pasta, go with whole wheat pasta.   Protein power - one-quarter of your plate: Fish, chicken, beans, and nuts are all healthy, versatile protein sources. Limit red meat.   The diet, however, does go beyond the plate, offering a few other suggestions.   Use healthy plant oils, such as olive, canola, soy, corn, sunflower and peanut. Check the labels, and avoid partially hydrogenated oil, which have unhealthy trans fats.   If youre thirsty, drink water. Coffee and tea are good in moderation, but skip sugary drinks and limit milk and dairy products to one or two daily servings.   The type of carbohydrate in the diet is more important than the amount. Some sources of carbohydrates, such as vegetables, fruits, whole grains, and beans-are healthier than others.   Finally, stay active  Signed, Berniece Salines, DO  12/30/2019 12:04 PM    [place]

## 2019-12-29 NOTE — Patient Instructions (Signed)
Medication Instructions:  *If you need a refill on your cardiac medications before your next appointment, please call your pharmacy*  Testing/Procedures: Your physician has requested that you have Cardiac CTA. Coronary computed tomography angiography (CCTA) uses an injection of iodine-containing contrast material and CT scanning to examine the arteries that supply blood to the heart and determine whether they have been narrowed. The images generated during a CT scan can be reformatted to create three-dimensional (3D) images that may be viewed on a monitor, printed on film or by a 3D printer, or transferred to electronic media.  Follow-Up: At [facility], you and your health needs are our priority.  As part of our continuing mission to provide you with exceptional heart care, we have created designated Provider Care Teams.  These Care Teams include your primary Cardiologist (physician) and Advanced Practice Providers (APPs -  Physician Assistants and Nurse Practitioners) who all work together to provide you with the care you need, when you need it.  We recommend signing up for the patient portal called "MyChart".  Sign up information is provided on this After Visit Summary.  MyChart is used to connect with patients for Virtual Visits (Telemedicine).  Patients are able to view lab/test results, encounter notes, upcoming appointments, etc.  Non-urgent messages can be sent to your provider as well.   To learn more about what you can do with MyChart, go to [email or web address].    Your next appointment:   Your physician recommends that you schedule a follow-up appointment in: 4 MONTHS with Dr. Harriet Masson.  The format for your next appointment:   In Person with Berniece Salines, DO  ----------------------------------Your cardiac CT will be scheduled at one of the below locations: ------------------------  [facility] [address] [place],  [address] [phone]  If scheduled at  [facility], please arrive at the [facility] main entrance of [facility] 30 minutes prior to test start time. Proceed to the [facility] Radiology Department (first floor) to check-in and test prep.  If scheduled at [facility], please arrive 15 mins early for check-in and test prep.  Please follow these instructions carefully (unless otherwise directed):  On the Night Before the Test: . Be sure to Drink plenty of water. . Do not consume any caffeinated/decaffeinated beverages or chocolate 12 hours prior to your test. . Do not take any antihistamines 12 hours prior to your test.  On the Day of the Test: . Drink plenty of water. Do not drink any water within one hour of the test. . Do not eat any food 4 hours prior to the test. . You may take your regular medications prior to the test.  . Take Metoprolol (Lopressor) two hours prior to test. Take 1 tablet (100 mg) by mouth -- RX SENT IN . FEMALES- please wear underwire-free bra if available    After the Test: . Drink plenty of water. . After receiving IV contrast, you may experience a mild flushed feeling. This is normal. . On occasion, you may experience a mild rash up to 24 hours after the test. This is not dangerous. If this occurs, you can take Benadryl 25 mg and increase your fluid intake. . If you experience trouble breathing, this can be serious. If it is severe call 911 IMMEDIATELY. If it is mild, please call our office.   Once we have confirmed authorization from your insurance company, we will call you to set up a date and time for  your test. Based on how quickly your insurance processes prior authorizations requests, please allow up to 4 weeks to be contacted for scheduling your Cardiac CT appointment. Be advised that routine Cardiac CT appointments could be scheduled as many as 8 weeks after your provider has ordered it.  For non-scheduling related questions, please contact the cardiac  imaging nurse navigator should you have any questions/concerns: Marchia Bond, Cardiac Imaging Nurse Navigator Burley Saver, Interim Cardiac Imaging Nurse [place] and Vascular Services Direct Office Dial: [phone]   For scheduling needs, including cancellations and rescheduling, please call Vivien Rota at [phone], option 3.

## 2020-01-05 ENCOUNTER — Telehealth (HOSPITAL_COMMUNITY): Payer: Self-pay | Admitting: *Deleted

## 2020-01-05 NOTE — Telephone Encounter (Signed)
Pt returning call regarding upcoming cardiac imaging study; pt verbalizes understanding of appt date/time, parking situation and where to check in, pre-test NPO status and medications ordered, and verified current allergies; name and call back number provided for further questions should they arise ° °Tyshay Adee Tai RN Navigator Cardiac Imaging °[facility] °[phone] office °[phone] cell ° °

## 2020-01-05 NOTE — Telephone Encounter (Signed)
Attempted to call patient regarding upcoming cardiac CT appointment. Left message on voicemail with name and callback number  Selmer Adduci Tai RN Navigator Cardiac Imaging [facility] [phone] Office [phone] Cell  

## 2020-01-08 ENCOUNTER — Other Ambulatory Visit: Payer: Self-pay

## 2020-01-08 ENCOUNTER — Ambulatory Visit (HOSPITAL_COMMUNITY)
Admission: RE | Admit: 2020-01-08 | Discharge: 2020-01-08 | Disposition: A | Discharge status: Home / Self Care | Payer: 59 | Source: Ambulatory Visit | Attending: Cardiology | Admitting: Cardiology

## 2020-01-08 DIAGNOSIS — R9439 Abnormal result of other cardiovascular function study: Secondary | ICD-10-CM | POA: Insufficient documentation

## 2020-01-08 DIAGNOSIS — R9430 Abnormal result of cardiovascular function study, unspecified: Secondary | ICD-10-CM | POA: Diagnosis not present

## 2020-01-08 MED ORDER — NITROGLYCERIN 0.4 MG SL SUBL
0.8000 mg | SUBLINGUAL_TABLET | Freq: Once | SUBLINGUAL | Status: AC
  Administered 2020-01-08: 0.8 mg via SUBLINGUAL

## 2020-01-08 MED ORDER — NITROGLYCERIN 0.4 MG SL SUBL
SUBLINGUAL_TABLET | SUBLINGUAL | Status: AC
  Filled 2020-01-08: qty 2

## 2020-01-08 MED ORDER — IOHEXOL 350 MG/ML SOLN
80.0000 mL | Freq: Once | INTRAVENOUS | Status: AC | PRN
  Administered 2020-01-08: 80 mL via INTRAVENOUS

## 2020-01-10 ENCOUNTER — Telehealth: Payer: Self-pay

## 2020-01-10 NOTE — Telephone Encounter (Signed)
Left message on patients voicemail to please return our call.   

## 2020-01-10 NOTE — Telephone Encounter (Signed)
Spoke with patient regarding results and recommendation.  Patient verbalizes understanding and is agreeable to plan of care. Advised patient to call back with any issues or concerns.   Patient would like for Dr. Harriet Masson to give her a call back whenever she is available. I let her know that it may be after clinic hours and she verbalized understanding.

## 2020-01-10 NOTE — Telephone Encounter (Signed)
-----   Message from Berniece Salines, DO sent at 01/10/2020  9:07 AM EST ----- I tried calling the patient multiple times to discuss her testing results with her.  Please let the patient know that her CT scan did not show any blockages.  However there is a congenital finding of a diverticulum which is just a pocket in her left ventricle.  This is not causing any problems for her.  We can discuss it in more detail once I am able to speak with her.

## 2020-01-15 ENCOUNTER — Telehealth: Payer: Self-pay | Admitting: Cardiology

## 2020-01-15 NOTE — Telephone Encounter (Signed)
Hi. Dr. Harriet Masson,  We received clearance request for Ms. Warsame upcoming bariatric surgery. I saw that a coronary CTA was recently ordered after abnormal stress Echo and showed no evidence of CAD. Did you want to see her back in the office again before procedure or is she OK to proceed with bariatric surgery?  Please route response back to P CV DIV PREOP.  Thank you! Jacobe Study

## 2020-01-15 NOTE — Telephone Encounter (Signed)
   Primary Cardiologist: Berniece Salines, DO  Chart reviewed as part of pre-operative protocol coverage. Patient was recently seen by Dr. Harriet Masson for pre-op evaluation for bariatric surgery. At initial visit, she reported shortness of breath. Stress Echo was ordered and showed regional wall motion abnormalities in distribution of left circumflex artery concerning for ischemia. Therefore, coronary CAD was ordered and showed coronary calcium score of 0 with no evidence of CAD. Per Dr. Harriet Masson, patient OK to proceed with surgery given normal coronary CTA.  I will route this recommendation to the requesting party via Epic fax function and remove from pre-op pool.  Please call with questions.  Darreld Mclean, PA-C 01/15/2020, 5:20 PM

## 2020-01-15 NOTE — Telephone Encounter (Signed)
   [facility] Pre-operative Risk Assessment    HEARTCARE STAFF: - Please ensure there is not already an duplicate clearance open for this procedure. - Under Visit Info/Reason for Call, type in Other and utilize the format Clearance MM/DD/YY or Clearance TBD. Do not use dashes or single digits. - If request is for dental extraction, please clarify the # of teeth to be extracted.  Request for surgical clearance:  1. What type of surgery is being performed? Bariatric Surgery   2. When is this surgery scheduled? TBD  3. What type of clearance is required (medical clearance vs. Pharmacy clearance to hold med vs. Both)? Medical   4. Are there any medications that need to be held prior to surgery and how long? no  5. Practice name and name of physician performing surgery? Dr. [name], [facility]   6. What is the office phone number? [phone]   7.   What is the office fax number? [phone]  8.   Anesthesia type (None, local, MAC, general) ? General     Johnna Acosta 01/15/2020, 3:24 PM  _________________________________________________________________   (provider comments below)

## 2020-01-15 NOTE — Telephone Encounter (Signed)
Yes we can go ahead and clear her.

## 2020-01-24 ENCOUNTER — Other Ambulatory Visit: Payer: Self-pay | Admitting: Family Medicine

## 2020-01-24 NOTE — Telephone Encounter (Signed)
Requested medication (s) are due for refill today: yes  Requested medication (s) are on the active medication list: {yes  Last refill: 04/13/19  #12  2 refills  Future visit scheduled no  Notes to clinic:not delegated  Requested Prescriptions  Pending Prescriptions Disp Refills   Vitamin D, Ergocalciferol, (DRISDOL) 1.25 MG (50000 UNIT) CAPS capsule [Pharmacy Med Name: VITAMIN D2 1.25MG(50,000 UNIT)] 12 capsule 2    Sig: TAKE 1 CAPSULE (50,000 UNITS TOTAL) BY MOUTH EVERY 7 (SEVEN) DAYS.      Endocrinology:  Vitamins - Vitamin D Supplementation Failed - 01/24/2020  2:31 AM      Failed - 50,000 IU strengths are not delegated      Failed - Phosphate in normal range and within 360 days    No results found for: PHOS        Failed - Vitamin D in normal range and within 360 days    Vit D, 25-Hydroxy  Date Value Ref Range Status  01/23/2019 18.7 (L) 30.0 - 100.0 ng/mL Final    Comment:    Vitamin D deficiency has been defined by the Institute of Medicine and an Endocrine Society practice guideline as a level of serum 25-OH vitamin D less than 20 ng/mL (1,2). The Endocrine Society went on to further define vitamin D insufficiency as a level between 21 and 29 ng/mL (2). 1. IOM (Institute of Medicine). 2010. Dietary reference    intakes for calcium and D. [place]: The    [employer]. 2. Holick MF, Binkley [place], Bischoff-Ferrari HA, et al.    Evaluation, treatment, and prevention of vitamin D    deficiency: an Endocrine Society clinical practice    guideline. JCEM. 2011 Jul; 96(7):1911-30.           Passed - Ca in normal range and within 360 days    Calcium  Date Value Ref Range Status  11/07/2019 9.7 8.7 - 10.2 mg/dL Final          Passed - Valid encounter within last 12 months    Recent Outpatient Visits           3 months ago Morbid obesity [facility])   [place], Megan P, DO   6 months ago Chronic left-sided thoracic back pain    [place], Megan P, DO   1 year ago Routine general medical examination at a health care facility   [facility], [place], DO   1 year ago [facility] spotted fever)   [place], Megan P, DO   1 year ago Bell's palsy   [place], [place], DO       Future Appointments             In 3 months T[place], Godfrey Pick, DO [facility] [employer]

## 2020-02-05 ENCOUNTER — Other Ambulatory Visit: Payer: Self-pay

## 2020-02-05 ENCOUNTER — Encounter: Payer: 59 | Attending: General Surgery | Admitting: Skilled Nursing Facility1

## 2020-02-05 DIAGNOSIS — E669 Obesity, unspecified: Secondary | ICD-10-CM | POA: Diagnosis present

## 2020-02-05 NOTE — Progress Notes (Signed)
Pre-Operative Nutrition Class:  Appt start time: 1610   End time:  1830.  Patient was seen on 02/05/2020 for Pre-Operative Bariatric Surgery Education at the [facility].    Surgery date: 03/05/2020 Surgery type: sleeve Start weight at [facility]: 242 Weight today: 249  Samples given per MNT protocol. Patient educated on appropriate usage: \Procare Multivitamin Lot [id number] Exp:02/2021  Celebrate Calcium  Lot # [id number] Exp:05/2021  Protein02 drink Shake Lot #[id number] Exp: 10/23  The following the learning objectives were met by the patient during this course:  Identify Pre-Op Dietary Goals and will begin 2 weeks pre-operatively  Identify appropriate sources of fluids and proteins   State protein recommendations and appropriate sources pre and post-operatively  Identify Post-Operative Dietary Goals and will follow for 2 weeks post-operatively  Identify appropriate multivitamin and calcium sources  Describe the need for physical activity post-operatively and will follow MD recommendations  State when to call healthcare provider regarding medication questions or post-operative complications  Handouts given during class include:  Pre-Op Bariatric Surgery Diet Handout  Protein Shake Handout  Post-Op Bariatric Surgery Nutrition Handout  BELT Program Information Flyer  Support Group Information Flyer  [facility] Bariatric Supplements Price List  Follow-Up Plan: Patient will follow-up at [facility] 2 weeks post operatively for diet advancement per MD.

## 2020-02-28 ENCOUNTER — Ambulatory Visit: Payer: Self-pay | Admitting: General Surgery

## 2020-02-28 NOTE — H&P (View-Only) (Signed)
Cindy Gilbert Appointment: 02/28/2020 9:15 AM Location: [facility] Patient #: [id number] DOB: 02/19/1976 Single / Language: Cindy Gilbert / Race: Black or African American Female  History of Present Illness Cindy Gilbert M. Cindy Janota Gilbert; 02/28/2020 9:52 AM) The patient is a 45 year old female who presents for a bariatric surgery evaluation. She comes in for follow-up visit. I initially saw her in late September for consideration for bariatric surgery. She denies any medical changes since I initially saw her. She denies any trips to the emergency room or hospital. Her upper GI showed some mild reflux and no dysmotility or hiatal hernia. Her lab work that we checked in the office showed a normal vitamin D. She had mild prediabetes with an A1c of 5.7. She did see cardiology and she had an abnormal stress echocardiogram. This did prompt a cardiac CT which was low risk she has score 0. We did receive cardiac clearance. She is also received psychological clearance. She has completed her bariatric pre-education class. Otherwise she denies any changes. No tobacco use. No recent steroid injections. No fever or chills, chest pain, source of breath, dyspnea on exertion   11/15/19 She is referred by Cindy Gilbert at [facility] for evaluation of weight loss surgery. She is interested in sleeve gastrectomy. She completed our seminar on line. Despite numerous attempts for sustained weight loss she has been unsuccessful. She has tried Weight Watchers, numerous fad diets, several different prescription medication such as phentermine, qysmia, and belviq.  Her comorbidities include dyslipidemia, possible sleep apnea, chronic right hip, knee and foot pain  She denies any chest pain, chest pressure, shortness of breath, orthopnea, paroxysmal nocturnal dyspnea, dyspnea on exertion, TIAs or amaurosis fugax. She denies any personal or family history of blood clots. She has been told she has high  cholesterol. She states that she had a home sleep study a few years ago but did not give any follow-up on that. I reviewed her sleep study from 2018. It was negative. She states that her father died suddenly at age 16 from multiple mini strokes  She denies any GERD, indigestion, heartburn, trouble with solids or liquids. She denies any prior abdominal surgery other than a C-section. She has a daily bowel movement. No melena or hematochezia. No dysuria. She does have chronic right hip, knee and foot pain. She has received injections for that type of pain. She denies a diagnosis of diabetes. She denies any severe headaches.  She denies any tobacco or drug use. She may drink a glass of wine a few times a week. She is a Licensed conveyancer.  Reviewed labs from September 14 which showed a normal comprehensive metabolic panel and normal CBC. She had a lipid test in November of last year which revealed a total cholesterol of 220    Problem List/Past Medical Cindy Gilbert; 02/28/2020 9:53 AM) CHRONIC PAIN OF RIGHT KNEE (M25.561) DYSLIPIDEMIA (E78.5) CHRONIC HIP PAIN, RIGHT (M25.551) FAMILY HISTORY OF CARDIAC DISORDER (Z82.49) SEVERE OBESITY (E66.01) The patient meets weight loss surgery criteria. I think she would be an acceptable candidate for sleeve gastrectomy. This patient encounter took 23 minutes today to perform the following: take history, perform exam, review outside records, interpret imaging, counsel the patient on their diagnosis and document encounter, findings & plan in the EHR PREDIABETES (R73.03)  Past Surgical History Cindy Gilbert; 02/28/2020 9:53 AM) Cesarean Section - 1  Diagnostic Studies History Cindy Gilbert; 02/28/2020 9:53 AM) Colonoscopy never Mammogram within last year  Pap Smear 1-5 years ago  Allergies Cindy Gilbert; 02/28/2020 9:27 AM) Wellbutrin *ANTIDEPRESSANTS* Ambien *HYPNOTICS/SEDATIVES/SLEEP DISORDER  AGENTS* Allergies Reconciled  Medication History (Cindy Gilbert; 02/28/2020 9:27 AM) Vitamin D (Ergocalciferol) (1.25 MG(50000 UT) Capsule, Oral) Active. No Current Medications (Taken starting 02/28/2020) Medications Reconciled  Social History Cindy Gilbert; 02/28/2020 9:53 AM) Alcohol use Moderate alcohol use. Caffeine use Coffee. No drug use Tobacco use Former smoker.  Family History Cindy Gilbert; 02/28/2020 9:53 AM) Cerebrovascular Accident Father. Heart Disease Father. Heart disease in female family member before age 34 Hypertension Father, Mother.  Pregnancy / Birth History Cindy Gilbert; 02/28/2020 9:53 AM) Age at menarche 55 years. Age of menopause <45 Contraceptive History Oral contraceptives. Gravida 3 Irregular periods Length (months) of breastfeeding 12-24 Maternal age 31-25 Para 70  Other Problems Cindy Gilbert; 02/28/2020 9:53 AM) No pertinent past medical history     Review of Systems Cindy Gilbert M. Cindy Leverette Gilbert; 02/28/2020 9:52 AM) All other systems negative  Vitals (Cindy Gilbert Gilbert; 02/28/2020 9:28 AM) 02/28/2020 9:27 AM Weight: 248.13 lb Height: 62in Body Surface Area: 2.09 m Body Mass Index: 45.38 kg/m  Temp.: 97.82F  Pulse: 104 (Regular)  P.OX: 98% (Room air) BP: 110/68(Sitting, Left Arm, Standard)        Physical Exam Cindy Gilbert M. Cindy Trent Gilbert; 02/28/2020 9:52 AM)  The physical exam findings are as follows: Note:severely obese, evenly distributed  General Mental Status-Alert. General Appearance-Consistent with stated age. Hydration-Well hydrated. Voice-Normal.  Head and Neck Head-normocephalic, atraumatic with no lesions or palpable masses. Trachea-midline. Thyroid Gland Characteristics - normal size and consistency.  Eye Eyeball - Bilateral-Extraocular movements intact. Sclera/Conjunctiva - Bilateral-No scleral icterus.  Chest and Lung Exam Chest and lung  exam reveals -quiet, even and easy respiratory effort with no use of accessory muscles and on auscultation, normal breath sounds, no adventitious sounds and normal vocal resonance. Inspection Chest Wall - Normal. Back - normal.  Breast - Did not examine.  Cardiovascular Cardiovascular examination reveals -normal heart sounds, regular rate and rhythm with no murmurs and normal pedal pulses bilaterally. Note: occasional skipped beat  Abdomen Inspection Inspection of the abdomen reveals - No Hernias. Skin - Scar - Note: old c/s scar. Palpation/Percussion Palpation and Percussion of the abdomen reveal - Soft, Non Tender, No Rebound tenderness, No Rigidity (guarding) and No hepatosplenomegaly. Auscultation Auscultation of the abdomen reveals - Bowel sounds normal.  Peripheral Vascular Upper Extremity Palpation - Pulses bilaterally normal.  Neurologic Neurologic evaluation reveals -alert and oriented x 3 with no impairment of recent or remote memory. Mental Status-Normal.  Neuropsychiatric The patient's mood and affect are described as -normal. Judgment and Insight-insight is appropriate concerning matters relevant to self.  Musculoskeletal Normal Exam - Left-Upper Extremity Strength Normal and Lower Extremity Strength Normal. Normal Exam - Right-Upper Extremity Strength Normal and Lower Extremity Strength Normal.  Lymphatic Head & Neck  General Head & Neck Lymphatics: Bilateral - Description - Normal. Axillary - Did not examine. Femoral & Inguinal - Did not examine.    Assessment & Plan Cindy Gilbert M. Minyon Billiter Gilbert; 02/28/2020 9:53 AM)  SEVERE OBESITY (E66.01) Story: The patient meets weight loss surgery criteria. I think she would be an acceptable candidate for sleeve gastrectomy. This patient encounter took 23 minutes today to perform the following: take history, perform exam, review outside records, interpret imaging, counsel the patient on their diagnosis and  document encounter, findings & plan in the EHR Impression: The patient meets weight loss surgery criteria. I think  the patient would be an acceptable candidate for Laparoscopic vertical sleeve gastrectomy.  We rediscussed sleeve gastrectomy. We discussed the typical hospitalization as well as the typical postoperative recovery course. I offered to discuss risk and benefits again but she declined. She completed the consent form. We rediscussed the typical issues that we see immediately after surgery. We discussed the diet transition. We discussed the importance of quarantining after her preoperative coated tests. All of her questions were asked and answered  Current Plans Pt Education - EMW_preopbariatric  FAMILY HISTORY OF CARDIAC DISORDER (Z82.49) Impression: recd cardiac clearance   CHRONIC HIP PAIN, RIGHT (M25.551)   DYSLIPIDEMIA (E78.5)   CHRONIC PAIN OF RIGHT KNEE (M25.561)   PREDIABETES (R73.03)  [name], Gilbert, FACS General, Bariatric, & Minimally Invasive Surgery [facility], [place]

## 2020-02-28 NOTE — Patient Instructions (Addendum)
DUE TO COVID-19 ONLY ONE VISITOR IS ALLOWED TO COME WITH YOU AND STAY IN THE WAITING ROOM ONLY DURING PRE OP AND PROCEDURE DAY OF SURGERY. THE 1 VISITOR  MAY VISIT WITH YOU AFTER SURGERY IN YOUR PRIVATE ROOM DURING VISITING HOURS ONLY!  YOU NEED TO HAVE A COVID 19 TEST ON__1/7_____ @_12 :38  [address] [place]  [id number], IT IS ON THE RIGHT GOING OUT [address] APPROXIMATELY  2 MINUTES PAST [employer] ON THE RIGHT. ONCE YOUR COVID TEST IS COMPLETED,  PLEASE BEGIN THE QUARANTINE INSTRUCTIONS AS OUTLINED IN YOUR HANDOUT.                [place]    Your procedure is scheduled on: 03/05/20   Report to [facility] Main  Entrance   Report to Short stay at 5:30 AM     Call this number if you have problems the morning of surgery [place], NO CHEWING GUM [place].   NO SOLID FOOD AFTER 6:00 PM THE NIGHT BEFORE YOUR SURGERY  . YOU MAY DRINK CLEAR FLUIDS. THE G2 DRINK YOU DRINK BEFORE YOU LEAVE HOME WILL BE THE LAST FLUIDS YOU DRINK BEFORE SURGERY. 4:30 AM  PAIN IS EXPECTED AFTER SURGERY AND WILL NOT BE COMPLETELY ELIMINATED  . AMBULATION AND TYLENOL WILL HELP REDUCE INCISIONAL AND GAS PAIN. MOVEMENT IS KEY!  YOU ARE EXPECTED TO BE OUT OF BED WITHIN 4 HOURS OF ADMISSION TO YOUR PATIENT ROOM.  SITTING IN THE RECLINER THROUGHOUT THE DAY IS IMPORTANT FOR DRINKING FLUIDS AND MOVING GAS THROUGHOUT THE GI TRACT.  COMPRESSION STOCKINGS SHOULD BE WORN [place] UNLESS YOU ARE WALKING.   INCENTIVE SPIROMETER SHOULD BE USED EVERY HOUR WHILE AWAKE TO DECREASE POST-OPERATIVE COMPLICATIONS SUCH AS PNEUMONIA.  WHEN DISCHARGED HOME, IT IS IMPORTANT TO CONTINUE TO WALK EVERY HOUR AND USE THE INCENTIVE SPIROMETER EVERY HOUR.          Take these medicines the morning of surgery with A SIP OF WATER: None                                 You may not have any metal on  your body including hair pins and              piercings  Do not wear jewelry, make-up, lotions, powders or perfumes, deodorant             Do not wear nail polish on your fingernails.  Do not shave  48 hours prior to surgery.           Do not bring valuables to the hospital. [place].  Contacts, dentures or bridgework may not be worn into surgery.                   Please read over the following fact sheets you were given: _____________________________________________________________________              .[place] - Preparing for Surgery Before surgery, you can play an important role.  Because skin is not sterile, your skin needs to be as free of germs as possible.  You can reduce the number of germs on your skin by washing with CHG (chlorahexidine  gluconate) soap before surgery.  CHG is an antiseptic cleaner which kills germs and bonds with the skin to continue killing germs even after washing. Please DO NOT use if you have an allergy to CHG or antibacterial soaps.  If your skin becomes reddened/irritated stop using the CHG and inform your nurse when you arrive at Short Stay. Do not shave (including legs and underarms) for at least 48 hours prior to the first CHG shower.   Please follow these instructions carefully:   1.  Shower with CHG Soap the night before surgery and the  morning of Surgery.  2.  If you choose to wash your hair, wash your hair first as usual with your  normal  shampoo.  3.  After you shampoo, rinse your hair and body thoroughly to remove the  shampoo.                                        4.  Use CHG as you would any other liquid soap.  You can apply chg directly  to the skin and wash                       Gently with a scrungie or clean washcloth.  5.  Apply the CHG Soap to your body ONLY FROM THE NECK DOWN.   Do not use on face/ open                           Wound or open sores. Avoid contact with eyes,  ears mouth and genitals (private parts).                       Wash face,  Genitals (private parts) with your normal soap.             6.  Wash thoroughly, paying special attention to the area where your surgery  will be performed.  7.  Thoroughly rinse your body with warm water from the neck down.  8.  DO NOT shower/wash with your normal soap after using and rinsing off  the CHG Soap.             9.  Pat yourself dry with a clean towel.            10.  Wear clean pajamas.            11.  Place clean sheets on your bed the night of your first shower and do not  sleep with pets. Day of Surgery : Do not apply any lotions/deodorants the morning of surgery.  Please wear clean clothes to the hospital/surgery center.  FAILURE TO FOLLOW THESE INSTRUCTIONS MAY RESULT IN THE CANCELLATION OF YOUR SURGERY PATIENT SIGNATURE_________________________________  NURSE SIGNATURE__________________________________  ________________________________________________________________________   Adam [place]  An incentive spirometer is a tool that can help keep your lungs clear and active. This tool measures how well you are filling your lungs with each breath. Taking long deep breaths may help reverse or decrease the chance of developing breathing (pulmonary) problems (especially infection) following:  A long period of time when you are unable to move or be active. BEFORE THE PROCEDURE   If the spirometer includes an indicator to show your best effort, your nurse or respiratory therapist will set it to a desired goal.  If possible,  sit up straight or lean slightly forward. Try not to slouch.  Hold the incentive spirometer in an upright position. INSTRUCTIONS FOR USE  1. Sit on the edge of your bed if possible, or sit up as far as you can in bed or on a chair. 2. Hold the incentive spirometer in an upright position. 3. Breathe out normally. 4. Place the mouthpiece in your mouth and seal your lips  tightly around it. 5. Breathe in slowly and as deeply as possible, raising the piston or the ball toward the top of the column. 6. Hold your breath for 3-5 seconds or for as long as possible. Allow the piston or ball to fall to the bottom of the column. 7. Remove the mouthpiece from your mouth and breathe out normally. 8. Rest for a few seconds and repeat Steps 1 through 7 at least 10 times every 1-2 hours when you are awake. Take your time and take a few normal breaths between deep breaths. 9. The spirometer may include an indicator to show your best effort. Use the indicator as a goal to work toward during each repetition. 10. After each set of 10 deep breaths, practice coughing to be sure your lungs are clear. If you have an incision (the cut made at the time of surgery), support your incision when coughing by placing a pillow or rolled up towels firmly against it. Once you are able to get out of bed, walk around indoors and cough well. You may stop using the incentive spirometer when instructed by your caregiver.  RISKS AND COMPLICATIONS  Take your time so you do not get dizzy or light-headed.  If you are in pain, you may need to take or ask for pain medication before doing incentive spirometry. It is harder to take a deep breath if you are having pain. AFTER USE  Rest and breathe slowly and easily.  It can be helpful to keep track of a log of your progress. Your caregiver can provide you with a simple table to help with this. If you are using the spirometer at home, follow these instructions: [place] IF:   You are having difficultly using the spirometer.  You have trouble using the spirometer as often as instructed.  Your pain medication is not giving enough relief while using the spirometer.  You develop fever of 100.5 F (38.1 C) or higher. SEEK IMMEDIATE MEDICAL CARE IF:   You cough up bloody sputum that had not been present before.  You develop fever of 102 F  (38.9 C) or greater.  You develop worsening pain at or near the incision site. MAKE SURE YOU:   Understand these instructions.  Will watch your condition.  Will get help right away if you are not doing well or get worse. Document Released: 06/22/2006 Document Revised: 05/04/2011 Document Reviewed: 08/23/2006 [facility] Patient Information 2014 [place], [place].   ________________________________________________________________________

## 2020-02-28 NOTE — H&P (Signed)
Cindy Gilbert Appointment: 02/28/2020 9:15 AM Location: [facility] Patient #: [id number] DOB: 1975/08/23 Single / Language: [name] / Race: Black or African American Female  History of Present Illness Randall Hiss M. Gilma Bessette MD; 02/28/2020 9:52 AM) The patient is a 45 year old female who presents for a bariatric surgery evaluation. She comes in for follow-up visit. I initially saw her in late September for consideration for bariatric surgery. She denies any medical changes since I initially saw her. She denies any trips to the emergency room or hospital. Her upper GI showed some mild reflux and no dysmotility or hiatal hernia. Her lab work that we checked in the office showed a normal vitamin D. She had mild prediabetes with an A1c of 5.7. She did see cardiology and she had an abnormal stress echocardiogram. This did prompt a cardiac CT which was low risk she has score 0. We did receive cardiac clearance. She is also received psychological clearance. She has completed her bariatric pre-education class. Otherwise she denies any changes. No tobacco use. No recent steroid injections. No fever or chills, chest pain, source of breath, dyspnea on exertion   11/15/19 She is referred by Dr Park Liter at [facility] for evaluation of weight loss surgery. She is interested in sleeve gastrectomy. She completed our seminar on line. Despite numerous attempts for sustained weight loss she has been unsuccessful. She has tried Weight Watchers, numerous fad diets, several different prescription medication such as phentermine, qysmia, and belviq.  Her comorbidities include dyslipidemia, possible sleep apnea, chronic right hip, knee and foot pain  She denies any chest pain, chest pressure, shortness of breath, orthopnea, paroxysmal nocturnal dyspnea, dyspnea on exertion, TIAs or amaurosis fugax. She denies any personal or family history of blood clots. She has been told she has high  cholesterol. She states that she had a home sleep study a few years ago but did not give any follow-up on that. I reviewed her sleep study from 2018. It was negative. She states that her father died suddenly at age 61 from multiple mini strokes  She denies any GERD, indigestion, heartburn, trouble with solids or liquids. She denies any prior abdominal surgery other than a C-section. She has a daily bowel movement. No melena or hematochezia. No dysuria. She does have chronic right hip, knee and foot pain. She has received injections for that type of pain. She denies a diagnosis of diabetes. She denies any severe headaches.  She denies any tobacco or drug use. She may drink a glass of wine a few times a week. She is a Licensed conveyancer.  Reviewed labs from September 14 which showed a normal comprehensive metabolic panel and normal CBC. She had a lipid test in November of last year which revealed a total cholesterol of 220    Problem List/Past Medical [name], MD; 02/28/2020 9:53 AM) CHRONIC PAIN OF RIGHT KNEE (M25.561) DYSLIPIDEMIA (E78.5) CHRONIC HIP PAIN, RIGHT (M25.551) FAMILY HISTORY OF CARDIAC DISORDER (Z82.49) SEVERE OBESITY (E66.01) The patient meets weight loss surgery criteria. I think she would be an acceptable candidate for sleeve gastrectomy. This patient encounter took 23 minutes today to perform the following: take history, perform exam, review outside records, interpret imaging, counsel the patient on their diagnosis and document encounter, findings & plan in the EHR PREDIABETES (R73.03)  Past Surgical History [name], MD; 02/28/2020 9:53 AM) Cesarean Section - 1  Diagnostic Studies History [name], MD; 02/28/2020 9:53 AM) Colonoscopy never Mammogram within last year  Pap Smear 1-5 years ago  Allergies Sherron Ales Marshall-McBride, CMA; 02/28/2020 9:27 AM) Wellbutrin *ANTIDEPRESSANTS* Ambien *HYPNOTICS/SEDATIVES/SLEEP DISORDER  AGENTS* Allergies Reconciled  Medication History ([name], CMA; 02/28/2020 9:27 AM) Vitamin D (Ergocalciferol) (1.25 MG(50000 UT) Capsule, Oral) Active. No Current Medications (Taken starting 02/28/2020) Medications Reconciled  Social History [name], MD; 02/28/2020 9:53 AM) Alcohol use Moderate alcohol use. Caffeine use Coffee. No drug use Tobacco use Former smoker.  Family History [name], MD; 02/28/2020 9:53 AM) Cerebrovascular Accident Father. Heart Disease Father. Heart disease in female family member before age 67 Hypertension Father, Mother.  Pregnancy / Birth History [name], MD; 02/28/2020 9:53 AM) Age at menarche 58 years. Age of menopause <45 Contraceptive History Oral contraceptives. Gravida 3 Irregular periods Length (months) of breastfeeding 12-24 Maternal age 38-25 Para 64  Other Problems [name], MD; 02/28/2020 9:53 AM) No pertinent past medical history     Review of Systems Randall Hiss M. Zeddie Njie MD; 02/28/2020 9:52 AM) All other systems negative  Vitals ([name] CMA; 02/28/2020 9:28 AM) 02/28/2020 9:27 AM Weight: 248.13 lb Height: 62in Body Surface Area: 2.09 m Body Mass Index: 45.38 kg/m  Temp.: 97.66F  Pulse: 104 (Regular)  P.OX: 98% (Room air) BP: 110/68(Sitting, Left Arm, Standard)        Physical Exam Randall Hiss M. Antanisha Mohs MD; 02/28/2020 9:52 AM)  The physical exam findings are as follows: Note:severely obese, evenly distributed  General Mental Status-Alert. General Appearance-Consistent with stated age. Hydration-Well hydrated. Voice-Normal.  Head and Neck Head-normocephalic, atraumatic with no lesions or palpable masses. Trachea-midline. Thyroid Gland Characteristics - normal size and consistency.  Eye Eyeball - Bilateral-Extraocular movements intact. Sclera/Conjunctiva - Bilateral-No scleral icterus.  Chest and Lung Exam Chest and lung  exam reveals -quiet, even and easy respiratory effort with no use of accessory muscles and on auscultation, normal breath sounds, no adventitious sounds and normal vocal resonance. Inspection Chest Wall - Normal. Back - normal.  Breast - Did not examine.  Cardiovascular Cardiovascular examination reveals -normal heart sounds, regular rate and rhythm with no murmurs and normal pedal pulses bilaterally. Note: occasional skipped beat  Abdomen Inspection Inspection of the abdomen reveals - No Hernias. Skin - Scar - Note: old c/s scar. Palpation/Percussion Palpation and Percussion of the abdomen reveal - Soft, Non Tender, No Rebound tenderness, No Rigidity (guarding) and No hepatosplenomegaly. Auscultation Auscultation of the abdomen reveals - Bowel sounds normal.  Peripheral Vascular Upper Extremity Palpation - Pulses bilaterally normal.  Neurologic Neurologic evaluation reveals -alert and oriented x 3 with no impairment of recent or remote memory. Mental Status-Normal.  Neuropsychiatric The patient's mood and affect are described as -normal. Judgment and Insight-insight is appropriate concerning matters relevant to self.  Musculoskeletal Normal Exam - Left-Upper Extremity Strength Normal and Lower Extremity Strength Normal. Normal Exam - Right-Upper Extremity Strength Normal and Lower Extremity Strength Normal.  Lymphatic Head & Neck  General Head & Neck Lymphatics: Bilateral - Description - Normal. Axillary - Did not examine. Femoral & Inguinal - Did not examine.    Assessment & Plan Randall Hiss M. Mima Cranmore MD; 02/28/2020 9:53 AM)  SEVERE OBESITY (E66.01) Story: The patient meets weight loss surgery criteria. I think she would be an acceptable candidate for sleeve gastrectomy. This patient encounter took 23 minutes today to perform the following: take history, perform exam, review outside records, interpret imaging, counsel the patient on their diagnosis and  document encounter, findings & plan in the EHR Impression: The patient meets weight loss surgery criteria. I think  the patient would be an acceptable candidate for Laparoscopic vertical sleeve gastrectomy.  We rediscussed sleeve gastrectomy. We discussed the typical hospitalization as well as the typical postoperative recovery course. I offered to discuss risk and benefits again but she declined. She completed the consent form. We rediscussed the typical issues that we see immediately after surgery. We discussed the diet transition. We discussed the importance of quarantining after her preoperative coated tests. All of her questions were asked and answered  Current Plans Pt Education - EMW_preopbariatric  FAMILY HISTORY OF CARDIAC DISORDER (Z82.49) Impression: recd cardiac clearance   CHRONIC HIP PAIN, RIGHT (M25.551)   DYSLIPIDEMIA (E78.5)   CHRONIC PAIN OF RIGHT KNEE (M25.561)   PREDIABETES (R73.03)  [name], MD, FACS General, Bariatric, & Minimally Invasive Surgery [facility], [place]

## 2020-02-29 NOTE — Progress Notes (Signed)
PCP -  Cardiologist -   PPM/ICD -  Device Orders -  Rep Notified -   Chest x-ray -  EKG -  Stress Test -  ECHO -  Cardiac Cath -   Sleep Study -  CPAP -   Fasting Blood Sugar -  Checks Blood Sugar _____ times a day  Blood Thinner Instructions: Aspirin Instructions:  ERAS Protcol - PRE-SURGERY Ensure or G2-   COVID TEST-    Anesthesia review:   Patient denies shortness of breath, fever, cough and chest pain at PAT appointment   All instructions explained to the patient, with a verbal understanding of the material. Patient agrees to go over the instructions while at home for a better understanding. Patient also instructed to self quarantine after being tested for COVID-19. The opportunity to ask questions was provided.

## 2020-03-01 ENCOUNTER — Encounter (HOSPITAL_COMMUNITY)
Admission: RE | Admit: 2020-03-01 | Discharge: 2020-03-01 | Disposition: A | Discharge status: Home / Self Care | Payer: 59 | Source: Ambulatory Visit | Attending: General Surgery | Admitting: General Surgery

## 2020-03-01 ENCOUNTER — Other Ambulatory Visit: Payer: Self-pay

## 2020-03-01 ENCOUNTER — Encounter (HOSPITAL_COMMUNITY): Payer: Self-pay

## 2020-03-01 ENCOUNTER — Other Ambulatory Visit (HOSPITAL_COMMUNITY)
Admission: RE | Admit: 2020-03-01 | Discharge: 2020-03-01 | Disposition: A | Discharge status: Home / Self Care | Payer: 59 | Source: Ambulatory Visit | Attending: General Surgery | Admitting: General Surgery

## 2020-03-01 DIAGNOSIS — Z20822 Contact with and (suspected) exposure to covid-19: Secondary | ICD-10-CM | POA: Insufficient documentation

## 2020-03-01 DIAGNOSIS — Z01812 Encounter for preprocedural laboratory examination: Secondary | ICD-10-CM | POA: Insufficient documentation

## 2020-03-01 LAB — CBC WITH DIFFERENTIAL/PLATELET
Abs Immature Granulocytes: 0.01 10*3/uL (ref 0.00–0.07)
Basophils Absolute: 0 10*3/uL (ref 0.0–0.1)
Basophils Relative: 0 %
Eosinophils Absolute: 0.1 10*3/uL (ref 0.0–0.5)
Eosinophils Relative: 2 %
HCT: 39 % (ref 36.0–46.0)
Hemoglobin: 12.7 g/dL (ref 12.0–15.0)
Immature Granulocytes: 0 %
Lymphocytes Relative: 48 %
Lymphs Abs: 2.6 10*3/uL (ref 0.7–4.0)
MCH: 27.5 pg (ref 26.0–34.0)
MCHC: 32.6 g/dL (ref 30.0–36.0)
MCV: 84.4 fL (ref 80.0–100.0)
Monocytes Absolute: 0.4 10*3/uL (ref 0.1–1.0)
Monocytes Relative: 7 %
Neutro Abs: 2.3 10*3/uL (ref 1.7–7.7)
Neutrophils Relative %: 43 %
Platelets: 222 10*3/uL (ref 150–400)
RBC: 4.62 MIL/uL (ref 3.87–5.11)
RDW: 13.9 % (ref 11.5–15.5)
WBC: 5.4 10*3/uL (ref 4.0–10.5)
nRBC: 0 % (ref 0.0–0.2)

## 2020-03-01 LAB — COMPREHENSIVE METABOLIC PANEL
ALT: 24 U/L (ref 0–44)
AST: 21 U/L (ref 15–41)
Albumin: 3.9 g/dL (ref 3.5–5.0)
Alkaline Phosphatase: 69 U/L (ref 38–126)
Anion gap: 10 (ref 5–15)
BUN: 17 mg/dL (ref 6–20)
CO2: 27 mmol/L (ref 22–32)
Calcium: 9.4 mg/dL (ref 8.9–10.3)
Chloride: 104 mmol/L (ref 98–111)
Creatinine, Ser: 0.77 mg/dL (ref 0.44–1.00)
GFR, Estimated: >60 mL/min (ref >60)
Glucose, Bld: 103 mg/dL — ABNORMAL HIGH (ref 70–99)
Potassium: 5.1 mmol/L (ref 3.5–5.1)
Sodium: 141 mmol/L (ref 135–145)
Total Bilirubin: 0.8 mg/dL (ref 0.3–1.2)
Total Protein: 7 g/dL (ref 6.5–8.1)

## 2020-03-01 LAB — TYPE AND SCREEN
ABO/RH(D): A POS
Antibody Screen: NEGATIVE

## 2020-03-01 NOTE — Progress Notes (Signed)
COVID Vaccine Completed:Yes Date COVID Vaccine completed:06/12/19 COVID vaccine manufacturer:   Moderna     PCP - Dr. Abelina Bachelor [place]  Chest x-ray - 11/27/19-epic EKG - 12/06/19-epic Stress Test - 12/27/19-epic ECHO - 12/27/19-epic Cardiac Cath - no Pacemaker/ICD device last checked:NA  Sleep Study - yes-negative CPAP -no   Fasting Blood Sugar - NA Checks Blood Sugar _____ times a day  Blood Thinner Instructions:NA Aspirin Instructions: Last Dose:  Anesthesia review:   Patient denies shortness of breath, fever, cough and chest pain at PAT appointment  yes Patient verbalized understanding of instructions that were given to them at the PAT appointment. Patient was also instructed that they will need to review over the PAT instructions again at home before surgery.yes No SOB with any activities. Cardiac work up was done for a murmur.

## 2020-03-02 LAB — SARS CORONAVIRUS 2 (TAT 6-24 HRS): SARS Coronavirus 2: NEGATIVE

## 2020-03-04 MED ORDER — BUPIVACAINE LIPOSOME 1.3 % IJ SUSP
20.0000 mL | Freq: Once | INTRAMUSCULAR | Status: DC
  Filled 2020-03-04: qty 20

## 2020-03-04 NOTE — Anesthesia Preprocedure Evaluation (Addendum)
Anesthesia Evaluation  Patient identified by MRN, date of birth, ID band Patient awake    Reviewed: Allergy & Precautions, H&P , NPO status , Patient's Chart, lab work & pertinent test results  Airway Mallampati: II  TM Distance: >3 FB Neck ROM: Full    Dental no notable dental hx. (+) Teeth Intact, Dental Advisory Given   Pulmonary neg pulmonary ROS, former smoker,    Pulmonary exam normal breath sounds clear to auscultation       Cardiovascular Exercise Tolerance: Good negative cardio ROS Normal cardiovascular exam Rhythm:Regular Rate:Normal  Coronary CT 21'  IMPRESSION: 1. No evidence of CAD, CADRADS = 0. 2. Coronary calcium score of 0. This was 0 percentile for age and sex matched control. 3. Normal coronary origin with right dominance. 4. There is a diverticulum in the mid LV septum near the inferior wall. No contrast seen in the RV to suggest VSD.    Neuro/Psych PSYCHIATRIC DISORDERS Anxiety Depression negative neurological ROS  negative psych ROS   GI/Hepatic negative GI ROS, Neg liver ROS,   Endo/Other  Morbid obesity  Renal/GU negative Renal ROS  negative genitourinary   Musculoskeletal negative musculoskeletal ROS (+)   Abdominal   Peds negative pediatric ROS (+)  Hematology negative hematology ROS (+)   Anesthesia Other Findings   Reproductive/Obstetrics negative OB ROS                            Anesthesia Physical Anesthesia Plan  ASA: III  Anesthesia Plan: General   Post-op Pain Management:    Induction: Intravenous  PONV Risk Score and Plan: 3 and Ondansetron, Dexamethasone, Treatment may vary due to age or medical condition and Midazolam  Airway Management Planned: Oral ETT  Additional Equipment: None  Intra-op Plan:   Post-operative Plan: Extubation in OR  Informed Consent: I have reviewed the patients History and Physical, chart, labs and discussed  the procedure including the risks, benefits and alternatives for the proposed anesthesia with the patient or authorized representative who has indicated his/her understanding and acceptance.       Plan Discussed with: Anesthesiologist and CRNA  Anesthesia Plan Comments: (  )       Anesthesia Quick Evaluation

## 2020-03-05 ENCOUNTER — Encounter (HOSPITAL_COMMUNITY): Payer: Self-pay | Admitting: General Surgery

## 2020-03-05 ENCOUNTER — Inpatient Hospital Stay (HOSPITAL_COMMUNITY): Payer: 59 | Admitting: Certified Registered"

## 2020-03-05 ENCOUNTER — Encounter (HOSPITAL_COMMUNITY)
Admission: RE | Disposition: A | Discharge status: Home / Self Care | Payer: Self-pay | Source: Home / Self Care | Attending: General Surgery

## 2020-03-05 ENCOUNTER — Other Ambulatory Visit: Payer: Self-pay

## 2020-03-05 ENCOUNTER — Inpatient Hospital Stay (HOSPITAL_COMMUNITY)
Admission: RE | Admit: 2020-03-05 | Discharge: 2020-03-07 | DRG: 621 | Disposition: A | Discharge status: Home / Self Care | Payer: 59 | Attending: General Surgery | Admitting: General Surgery

## 2020-03-05 DIAGNOSIS — Z888 Allergy status to other drugs, medicaments and biological substances status: Secondary | ICD-10-CM

## 2020-03-05 DIAGNOSIS — R7303 Prediabetes: Secondary | ICD-10-CM

## 2020-03-05 DIAGNOSIS — G8929 Other chronic pain: Secondary | ICD-10-CM | POA: Diagnosis present

## 2020-03-05 DIAGNOSIS — N926 Irregular menstruation, unspecified: Secondary | ICD-10-CM | POA: Diagnosis present

## 2020-03-05 DIAGNOSIS — Z6841 Body Mass Index (BMI) 40.0 and over, adult: Secondary | ICD-10-CM | POA: Diagnosis not present

## 2020-03-05 DIAGNOSIS — K219 Gastro-esophageal reflux disease without esophagitis: Secondary | ICD-10-CM | POA: Diagnosis present

## 2020-03-05 DIAGNOSIS — Z9884 Bariatric surgery status: Secondary | ICD-10-CM

## 2020-03-05 DIAGNOSIS — M25561 Pain in right knee: Secondary | ICD-10-CM | POA: Diagnosis present

## 2020-03-05 DIAGNOSIS — E782 Mixed hyperlipidemia: Secondary | ICD-10-CM | POA: Diagnosis present

## 2020-03-05 DIAGNOSIS — M25551 Pain in right hip: Secondary | ICD-10-CM

## 2020-03-05 HISTORY — DX: Bariatric surgery status: Z98.84

## 2020-03-05 HISTORY — PX: UPPER GI ENDOSCOPY: SHX6162

## 2020-03-05 HISTORY — PX: LAPAROSCOPIC GASTRIC SLEEVE RESECTION: SHX5895

## 2020-03-05 HISTORY — DX: Other chronic pain: G89.29

## 2020-03-05 HISTORY — DX: Prediabetes: R73.03

## 2020-03-05 HISTORY — DX: Pain in right hip: M25.551

## 2020-03-05 LAB — SURGICAL PATHOLOGY

## 2020-03-05 LAB — HEMOGLOBIN AND HEMATOCRIT, BLOOD
HCT: 39.7 % (ref 36.0–46.0)
Hemoglobin: 13.1 g/dL (ref 12.0–15.0)

## 2020-03-05 LAB — PREGNANCY, URINE: Preg Test, Ur: NEGATIVE

## 2020-03-05 SURGERY — GASTRECTOMY, SLEEVE, LAPAROSCOPIC
Anesthesia: General | Site: Abdomen

## 2020-03-05 MED ORDER — FENTANYL CITRATE (PF) 100 MCG/2ML IJ SOLN
INTRAMUSCULAR | Status: AC
  Filled 2020-03-05: qty 2

## 2020-03-05 MED ORDER — CHLORHEXIDINE GLUCONATE 4 % EX LIQD: 60.0000 mL | Freq: Once | CUTANEOUS | Status: DC

## 2020-03-05 MED ORDER — SODIUM CHLORIDE (PF) 0.9 % IJ SOLN
INTRAMUSCULAR | Status: AC
  Filled 2020-03-05: qty 50

## 2020-03-05 MED ORDER — ENSURE MAX PROTEIN PO LIQD
2.0000 [oz_av] | ORAL | Status: DC
  Administered 2020-03-06 – 2020-03-07 (×7): 2 [oz_av] via ORAL

## 2020-03-05 MED ORDER — FENTANYL CITRATE (PF) 100 MCG/2ML IJ SOLN
INTRAMUSCULAR | Status: AC
  Administered 2020-03-05: 25 ug via INTRAVENOUS
  Filled 2020-03-05: qty 2

## 2020-03-05 MED ORDER — LIDOCAINE HCL (PF) 2 % IJ SOLN
INTRAMUSCULAR | Status: AC
  Filled 2020-03-05: qty 15

## 2020-03-05 MED ORDER — KETAMINE HCL 10 MG/ML IJ SOLN
INTRAMUSCULAR | Status: AC
  Filled 2020-03-05: qty 1

## 2020-03-05 MED ORDER — DEXAMETHASONE SODIUM PHOSPHATE 4 MG/ML IJ SOLN
INTRAMUSCULAR | Status: DC | PRN
  Administered 2020-03-05: 10 mg via INTRAVENOUS

## 2020-03-05 MED ORDER — PROPOFOL 10 MG/ML IV BOLUS
INTRAVENOUS | Status: DC | PRN
  Administered 2020-03-05: 170 mg via INTRAVENOUS

## 2020-03-05 MED ORDER — OXYCODONE HCL 5 MG/5ML PO SOLN
5.0000 mg | Freq: Four times a day (QID) | ORAL | Status: DC | PRN
  Administered 2020-03-07: 5 mg via ORAL
  Filled 2020-03-05: qty 5

## 2020-03-05 MED ORDER — MEPERIDINE HCL 50 MG/ML IJ SOLN
6.2500 mg | INTRAMUSCULAR | Status: DC | PRN
Start: 2020-03-05 — End: 2020-03-05

## 2020-03-05 MED ORDER — ACETAMINOPHEN 160 MG/5ML PO SOLN: 325.0000 mg | ORAL | Status: DC | PRN

## 2020-03-05 MED ORDER — ENOXAPARIN SODIUM 30 MG/0.3ML ~~LOC~~ SOLN
30.0000 mg | Freq: Two times a day (BID) | SUBCUTANEOUS | Status: DC
  Administered 2020-03-05 – 2020-03-07 (×4): 30 mg via SUBCUTANEOUS
  Filled 2020-03-05 (×6): qty 0.3

## 2020-03-05 MED ORDER — SODIUM CHLORIDE (PF) 0.9 % IJ SOLN
INTRAMUSCULAR | Status: DC | PRN
  Administered 2020-03-05: 50 mL

## 2020-03-05 MED ORDER — PROMETHAZINE HCL 25 MG/ML IJ SOLN: 12.5000 mg | Freq: Four times a day (QID) | INTRAMUSCULAR | Status: DC | PRN

## 2020-03-05 MED ORDER — MIDAZOLAM HCL 2 MG/2ML IJ SOLN
INTRAMUSCULAR | Status: AC
  Filled 2020-03-05: qty 2

## 2020-03-05 MED ORDER — STERILE WATER FOR IRRIGATION IR SOLN
Status: DC | PRN
  Administered 2020-03-05: 1000 mL

## 2020-03-05 MED ORDER — ROCURONIUM BROMIDE 10 MG/ML (PF) SYRINGE
PREFILLED_SYRINGE | INTRAVENOUS | Status: DC | PRN
  Administered 2020-03-05: 80 mg via INTRAVENOUS

## 2020-03-05 MED ORDER — LACTATED RINGERS IR SOLN
Status: DC | PRN
  Administered 2020-03-05: 1000 mL

## 2020-03-05 MED ORDER — PHENYLEPHRINE 40 MCG/ML (10ML) SYRINGE FOR IV PUSH (FOR BLOOD PRESSURE SUPPORT)
PREFILLED_SYRINGE | INTRAVENOUS | Status: AC
  Filled 2020-03-05: qty 10

## 2020-03-05 MED ORDER — KETAMINE HCL 10 MG/ML IJ SOLN
INTRAMUSCULAR | Status: DC | PRN
  Administered 2020-03-05: 30 mg via INTRAVENOUS

## 2020-03-05 MED ORDER — HEPARIN SODIUM (PORCINE) 5000 UNIT/ML IJ SOLN
5000.0000 [IU] | INTRAMUSCULAR | Status: AC
  Administered 2020-03-05: 5000 [IU] via SUBCUTANEOUS
  Filled 2020-03-05: qty 1

## 2020-03-05 MED ORDER — DEXAMETHASONE SODIUM PHOSPHATE 4 MG/ML IJ SOLN: 4.0000 mg | INTRAMUSCULAR | Status: DC

## 2020-03-05 MED ORDER — MORPHINE SULFATE (PF) 4 MG/ML IV SOLN: 1.0000 mg | INTRAVENOUS | Status: DC | PRN

## 2020-03-05 MED ORDER — APREPITANT 40 MG PO CAPS
40.0000 mg | ORAL_CAPSULE | ORAL | Status: AC
  Administered 2020-03-05: 40 mg via ORAL
  Filled 2020-03-05: qty 1

## 2020-03-05 MED ORDER — 0.9 % SODIUM CHLORIDE (POUR BTL) OPTIME
TOPICAL | Status: DC | PRN
  Administered 2020-03-05: 1000 mL

## 2020-03-05 MED ORDER — ONDANSETRON HCL 4 MG/2ML IJ SOLN
4.0000 mg | Freq: Four times a day (QID) | INTRAMUSCULAR | Status: DC | PRN
  Administered 2020-03-05 – 2020-03-06 (×2): 4 mg via INTRAVENOUS
  Filled 2020-03-05 (×2): qty 2

## 2020-03-05 MED ORDER — FENTANYL CITRATE (PF) 250 MCG/5ML IJ SOLN
INTRAMUSCULAR | Status: DC | PRN
  Administered 2020-03-05: 100 ug via INTRAVENOUS

## 2020-03-05 MED ORDER — FENTANYL CITRATE (PF) 100 MCG/2ML IJ SOLN
25.0000 ug | INTRAMUSCULAR | Status: DC | PRN
  Administered 2020-03-05: 25 ug via INTRAVENOUS

## 2020-03-05 MED ORDER — HYDRALAZINE HCL 20 MG/ML IJ SOLN: 10.0000 mg | INTRAMUSCULAR | Status: DC | PRN

## 2020-03-05 MED ORDER — ONDANSETRON HCL 4 MG/2ML IJ SOLN: 4.0000 mg | Freq: Once | INTRAMUSCULAR | Status: DC | PRN

## 2020-03-05 MED ORDER — KCL IN DEXTROSE-NACL 20-5-0.45 MEQ/L-%-% IV SOLN
INTRAVENOUS | Status: DC
  Filled 2020-03-05 (×7): qty 1000

## 2020-03-05 MED ORDER — OXYCODONE HCL 5 MG/5ML PO SOLN
5.0000 mg | Freq: Once | ORAL | Status: DC | PRN
Start: 2020-03-05 — End: 2020-03-05

## 2020-03-05 MED ORDER — ONDANSETRON HCL 4 MG/2ML IJ SOLN
INTRAMUSCULAR | Status: DC | PRN
  Administered 2020-03-05 (×2): 4 mg via INTRAVENOUS

## 2020-03-05 MED ORDER — ACETAMINOPHEN 160 MG/5ML PO SOLN
1000.0000 mg | Freq: Three times a day (TID) | ORAL | Status: DC
  Administered 2020-03-05 – 2020-03-07 (×4): 1000 mg via ORAL
  Filled 2020-03-05 (×4): qty 40.6

## 2020-03-05 MED ORDER — PHENYLEPHRINE 40 MCG/ML (10ML) SYRINGE FOR IV PUSH (FOR BLOOD PRESSURE SUPPORT)
PREFILLED_SYRINGE | INTRAVENOUS | Status: DC | PRN
  Administered 2020-03-05 (×2): 80 ug via INTRAVENOUS

## 2020-03-05 MED ORDER — SIMETHICONE 80 MG PO CHEW
80.0000 mg | CHEWABLE_TABLET | Freq: Four times a day (QID) | ORAL | Status: DC | PRN
  Administered 2020-03-05 – 2020-03-07 (×2): 80 mg via ORAL
  Filled 2020-03-05 (×2): qty 1

## 2020-03-05 MED ORDER — SCOPOLAMINE 1 MG/3DAYS TD PT72
1.0000 | MEDICATED_PATCH | TRANSDERMAL | Status: DC
  Administered 2020-03-05: 1.5 mg via TRANSDERMAL
  Filled 2020-03-05: qty 1

## 2020-03-05 MED ORDER — SODIUM CHLORIDE 0.9 % IV SOLN
2.0000 g | INTRAVENOUS | Status: AC
  Administered 2020-03-05: 2 g via INTRAVENOUS
  Filled 2020-03-05: qty 2

## 2020-03-05 MED ORDER — PROPOFOL 10 MG/ML IV BOLUS
INTRAVENOUS | Status: AC
  Filled 2020-03-05: qty 20

## 2020-03-05 MED ORDER — CHLORHEXIDINE GLUCONATE 0.12 % MT SOLN
15.0000 mL | Freq: Once | OROMUCOSAL | Status: AC
  Administered 2020-03-05: 15 mL via OROMUCOSAL

## 2020-03-05 MED ORDER — FLUTICASONE PROPIONATE 50 MCG/ACT NA SUSP
2.0000 | Freq: Every day | NASAL | Status: DC | PRN
  Filled 2020-03-05: qty 16

## 2020-03-05 MED ORDER — LACTATED RINGERS IV SOLN: INTRAVENOUS | Status: DC

## 2020-03-05 MED ORDER — BUPIVACAINE LIPOSOME 1.3 % IJ SUSP
INTRAMUSCULAR | Status: DC | PRN
  Administered 2020-03-05: 20 mL

## 2020-03-05 MED ORDER — ACETAMINOPHEN 500 MG PO TABS
1000.0000 mg | ORAL_TABLET | Freq: Three times a day (TID) | ORAL | Status: DC
  Filled 2020-03-05 (×2): qty 2

## 2020-03-05 MED ORDER — GABAPENTIN 100 MG PO CAPS
200.0000 mg | ORAL_CAPSULE | Freq: Two times a day (BID) | ORAL | Status: DC
  Administered 2020-03-05 (×2): 200 mg via ORAL
  Filled 2020-03-05 (×3): qty 2

## 2020-03-05 MED ORDER — GABAPENTIN 300 MG PO CAPS
300.0000 mg | ORAL_CAPSULE | ORAL | Status: AC
  Administered 2020-03-05: 300 mg via ORAL
  Filled 2020-03-05: qty 1

## 2020-03-05 MED ORDER — ORAL CARE MOUTH RINSE: 15.0000 mL | Freq: Once | OROMUCOSAL | Status: AC

## 2020-03-05 MED ORDER — ACETAMINOPHEN 500 MG PO TABS
1000.0000 mg | ORAL_TABLET | ORAL | Status: AC
  Administered 2020-03-05: 1000 mg via ORAL
  Filled 2020-03-05: qty 2

## 2020-03-05 MED ORDER — ACETAMINOPHEN 325 MG PO TABS: 325.0000 mg | ORAL_TABLET | ORAL | Status: DC | PRN

## 2020-03-05 MED ORDER — ROCURONIUM BROMIDE 10 MG/ML (PF) SYRINGE
PREFILLED_SYRINGE | INTRAVENOUS | Status: AC
  Filled 2020-03-05: qty 10

## 2020-03-05 MED ORDER — PANTOPRAZOLE SODIUM 40 MG IV SOLR
40.0000 mg | Freq: Every day | INTRAVENOUS | Status: DC
  Administered 2020-03-05 – 2020-03-06 (×2): 40 mg via INTRAVENOUS
  Filled 2020-03-05 (×2): qty 40

## 2020-03-05 MED ORDER — OXYCODONE HCL 5 MG PO TABS: 5.0000 mg | ORAL_TABLET | Freq: Once | ORAL | Status: DC | PRN

## 2020-03-05 MED ORDER — MIDAZOLAM HCL 5 MG/5ML IJ SOLN
INTRAMUSCULAR | Status: DC | PRN
  Administered 2020-03-05: 2 mg via INTRAVENOUS

## 2020-03-05 MED ORDER — LIDOCAINE 2% (20 MG/ML) 5 ML SYRINGE
INTRAMUSCULAR | Status: DC | PRN
  Administered 2020-03-05: 60 mg via INTRAVENOUS
  Administered 2020-03-05: 1.5 mg/kg/h via INTRAVENOUS

## 2020-03-05 MED ORDER — SUGAMMADEX SODIUM 200 MG/2ML IV SOLN
INTRAVENOUS | Status: DC | PRN
  Administered 2020-03-05: 200 mg via INTRAVENOUS

## 2020-03-05 MED ORDER — DIPHENHYDRAMINE HCL 50 MG/ML IJ SOLN: 12.5000 mg | Freq: Three times a day (TID) | INTRAMUSCULAR | Status: DC | PRN

## 2020-03-05 SURGICAL SUPPLY — 68 items
APPLICATOR COTTON TIP 6 STRL (MISCELLANEOUS) IMPLANT
APPLICATOR COTTON TIP 6IN STRL (MISCELLANEOUS)
APPLIER CLIP ROT 10 11.4 M/L (STAPLE)
APPLIER CLIP ROT 13.4 12 LRG (CLIP) ×3
BENZOIN TINCTURE PRP APPL 2/3 (GAUZE/BANDAGES/DRESSINGS) ×3 IMPLANT
BLADE SURG SZ11 CARB STEEL (BLADE) ×3 IMPLANT
BNDG ADH 1X3 SHEER STRL LF (GAUZE/BANDAGES/DRESSINGS) ×18 IMPLANT
CABLE HIGH FREQUENCY MONO STRZ (ELECTRODE) ×3 IMPLANT
CHLORAPREP W/TINT 26 (MISCELLANEOUS) ×6 IMPLANT
CLIP APPLIE ROT 10 11.4 M/L (STAPLE) IMPLANT
CLIP APPLIE ROT 13.4 12 LRG (CLIP) ×2 IMPLANT
COVER SURGICAL LIGHT HANDLE (MISCELLANEOUS) ×3 IMPLANT
COVER WAND RF STERILE (DRAPES) IMPLANT
DECANTER SPIKE VIAL GLASS SM (MISCELLANEOUS) ×3 IMPLANT
DEVICE SUT QUICK LOAD TK 5 (STAPLE) IMPLANT
DEVICE SUT TI-KNOT TK 5X26 (MISCELLANEOUS) IMPLANT
DEVICE SUTURE ENDOST 10MM (ENDOMECHANICALS) IMPLANT
DISSECTOR BLUNT TIP ENDO 5MM (MISCELLANEOUS) IMPLANT
DRAPE UTILITY XL STRL (DRAPES) ×6 IMPLANT
ELECT L-HOOK LAP 45CM DISP (ELECTROSURGICAL)
ELECT REM PT RETURN 15FT ADLT (MISCELLANEOUS) ×3 IMPLANT
ELECTRODE L-HOOK LAP 45CM DISP (ELECTROSURGICAL) IMPLANT
GAUZE SPONGE 2X2 8PLY STRL LF (GAUZE/BANDAGES/DRESSINGS) IMPLANT
GAUZE SPONGE 4X4 12PLY STRL (GAUZE/BANDAGES/DRESSINGS) IMPLANT
GLOVE BIO SURGEON STRL SZ7.5 (GLOVE) ×3 IMPLANT
GLOVE INDICATOR 8.0 STRL GRN (GLOVE) ×3 IMPLANT
GOWN STRL REUS W/TWL XL LVL3 (GOWN DISPOSABLE) ×9 IMPLANT
GRASPER SUT TROCAR 14GX15 (MISCELLANEOUS) ×3 IMPLANT
KIT BASIN OR (CUSTOM PROCEDURE TRAY) ×3 IMPLANT
KIT TURNOVER KIT A (KITS) IMPLANT
MARKER SKIN DUAL TIP RULER LAB (MISCELLANEOUS) ×3 IMPLANT
MAT PREVALON FULL STRYKER (MISCELLANEOUS) ×3 IMPLANT
NEEDLE SPNL 22GX3.5 QUINCKE BK (NEEDLE) ×3 IMPLANT
PACK UNIVERSAL I (CUSTOM PROCEDURE TRAY) ×3 IMPLANT
PENCIL SMOKE EVACUATOR (MISCELLANEOUS) IMPLANT
RELOAD STAPLER 60MM BLK (STAPLE) IMPLANT
RELOAD STAPLER BLUE 60MM (STAPLE) ×6 IMPLANT
RELOAD STAPLER GOLD 60MM (STAPLE) IMPLANT
RELOAD STAPLER GREEN 60MM (STAPLE) ×2 IMPLANT
SCISSORS LAP 5X45 EPIX DISP (ENDOMECHANICALS) IMPLANT
SEALANT SURGICAL APPL DUAL CAN (MISCELLANEOUS) IMPLANT
SET IRRIG TUBING LAPAROSCOPIC (IRRIGATION / IRRIGATOR) ×3 IMPLANT
SET TUBE SMOKE EVAC HIGH FLOW (TUBING) ×3 IMPLANT
SHEARS HARMONIC ACE PLUS 45CM (MISCELLANEOUS) ×3 IMPLANT
SLEEVE GASTRECTOMY 40FR VISIGI (MISCELLANEOUS) ×3 IMPLANT
SLEEVE XCEL OPT CAN 5 100 (ENDOMECHANICALS) ×9 IMPLANT
SOL ANTI FOG 6CC (MISCELLANEOUS) ×2 IMPLANT
SOLUTION ANTI FOG 6CC (MISCELLANEOUS) ×1
SPONGE GAUZE 2X2 STER 10/PKG (GAUZE/BANDAGES/DRESSINGS)
SPONGE LAP 18X18 RF (DISPOSABLE) ×3 IMPLANT
STAPLER ECHELON BIOABSB 60 FLE (MISCELLANEOUS) ×12 IMPLANT
STAPLER ECHELON LONG 60 440 (INSTRUMENTS) ×3 IMPLANT
STAPLER RELOAD 60MM BLK (STAPLE)
STAPLER RELOAD BLUE 60MM (STAPLE) ×9
STAPLER RELOAD GOLD 60MM (STAPLE)
STAPLER RELOAD GREEN 60MM (STAPLE) ×3
STRIP CLOSURE SKIN 1/2X4 (GAUZE/BANDAGES/DRESSINGS) ×3 IMPLANT
SUT MNCRL AB 4-0 PS2 18 (SUTURE) ×3 IMPLANT
SUT SURGIDAC NAB ES-9 0 48 120 (SUTURE) IMPLANT
SUT VICRYL 0 TIES 12 18 (SUTURE) ×3 IMPLANT
SYR 20ML LL LF (SYRINGE) ×3 IMPLANT
SYR 50ML LL SCALE MARK (SYRINGE) ×3 IMPLANT
TOWEL OR 17X26 10 PK STRL BLUE (TOWEL DISPOSABLE) ×3 IMPLANT
TOWEL OR NON WOVEN STRL DISP B (DISPOSABLE) ×3 IMPLANT
TROCAR BLADELESS 15MM (ENDOMECHANICALS) ×3 IMPLANT
TROCAR BLADELESS OPT 5 100 (ENDOMECHANICALS) ×3 IMPLANT
TUBING CONNECTING 10 (TUBING) ×6 IMPLANT
TUBING ENDO SMARTCAP (MISCELLANEOUS) ×3 IMPLANT

## 2020-03-05 NOTE — Progress Notes (Signed)
PHARMACY CONSULT FOR:  Risk Assessment for Post-Discharge VTE Following Bariatric Surgery  Post-Discharge VTE Risk Assessment: This patient's probability of 30-day post-discharge VTE is increased due to the factors marked:   Female    Age >/=60 years    BMI >/=50 kg/m2    CHF    Dyspnea at Rest    Paraplegia   x Non-gastric-band surgery    Operation Time >/=3 hr    Return to OR     Length of Stay >/= 3 d   Hx of VTE   Hypercoagulable condition   Significant venous stasis       Predicted probability of 30-day post-discharge VTE: - 0.16%   Other patient-specific factors to consider: - no noted history of PE/DVT   Recommendation for Discharge: No pharmacologic prophylaxis post-discharge       Cindy Gilbert is a 45 y.o. female who underwent  laparoscopic sleeve gastrectomy  on 1/11/20222    Case start: 0752 Case end: 0854   Allergies  Allergen Reactions  . Ambien [Zolpidem Tartrate] Itching  . Belviq [Lorcaserin] Itching  . Bupropion Itching  . Cymbalta [Duloxetine Hcl] Other (See Comments)    Headache    Patient Measurements: Height: 5' 3"  (160 cm) Weight: 111.5 kg (245 lb 13 oz) IBW/kg (Calculated) : 52.4 Body mass index is 43.54 kg/m.  Recent Labs    03/05/20 1116  HGB 13.1  HCT 39.7   Estimated Creatinine Clearance: 107.7 mL/min (by C-G formula based on SCr of 0.77 mg/dL).    Past Medical History:  Diagnosis Date  . Allergy    Seasonal  . Anxiety 03/22/2018  . Controlled substance agreement signed 12/18/2016   For xanax  . Depression    previously on lexapro, celexa,   . Dyslipidemia   . Heart murmur 12/27/2019  . Hip pain, chronic    Right  . Insomnia   . Morbid obesity ([place]) 10/15/2019  . Multinodular goiter (nontoxic) 03/16/2019   Small nodules- no need for bx or follow up imaging 2020  . Ovarian cyst    left  . Right knee pain   . Vitamin D deficiency      Medications Prior to Admission  Medication Sig Dispense Refill Last  Dose  . ALPRAZolam (XANAX) 0.5 MG tablet Take 0.5-2 tablets (0.25-1 mg total) by mouth 2 (two) times daily as needed for anxiety. 60 tablet 1 Past Month at Unknown time  . fluticasone (FLONASE) 50 MCG/ACT nasal spray Place 2 sprays into both nostrils daily. (Patient taking differently: Place 2 sprays into both nostrils daily as needed for allergies.) 16 g 12 Past Month at Unknown time  . Multiple Vitamins-Minerals (MULTIVITAMIN WITH MINERALS) tablet Take 1 tablet by mouth daily.   03/04/2020 at Unknown time  . Vitamin D, Ergocalciferol, (DRISDOL) 1.25 MG (50000 UNIT) CAPS capsule TAKE 1 CAPSULE (50,000 UNITS TOTAL) BY MOUTH EVERY 7 (SEVEN) DAYS. (Patient not taking: Reported on 02/19/2020) 12 capsule 2 Not Taking at Unknown time       Royetta Asal, PharmD, BCPS 03/05/2020 1:13 PM

## 2020-03-05 NOTE — Op Note (Signed)
Preoperative diagnosis: laparoscopic sleeve gastrectomy  Postoperative diagnosis: Same   Procedure: Upper endoscopy   Surgeon: Clovis Riley, M.D.  Anesthesia: Gen.   Description of procedure: The endoscope was placed in the mouth and oropharynx and under endoscopic vision it was advanced to the esophagogastric junction which was identified at 36cm from the teeth.  The pouch was tensely insufflated while the upper abdomen was flooded with irrigation to perform a leak test, which was negative. No bubbles were seen.  The staple line was hemostatic and the lumen was evenly tubular without undue narrowing, angulation or twisting specifically at the incisura angularis. The lumen was decompressed and the scope was withdrawn without difficulty.    Clovis Riley, M.D. General, Bariatric, & Minimally Invasive Surgery [facility]

## 2020-03-05 NOTE — Transfer of Care (Signed)
Immediate Anesthesia Transfer of Care Note  Patient: Cindy Gilbert  Procedure(s) Performed: LAPAROSCOPIC GASTRIC SLEEVE RESECTION (N/A Abdomen) UPPER GI ENDOSCOPY (N/A )  Patient Location: PACU  Anesthesia Type:General  Level of Consciousness: awake, alert  and oriented  Airway & Oxygen Therapy: Patient Spontanous Breathing and Patient connected to face mask oxygen  Post-op Assessment: Report given to RN and Post -op Vital signs reviewed and stable  Post vital signs: Reviewed and stable  Last Vitals:  Vitals Value Taken Time  BP 153/93 03/05/20 0901  Temp    Pulse 89 03/05/20 0901  Resp 23 03/05/20 0901  SpO2 100 % 03/05/20 0901  Vitals shown include unvalidated device data.  Last Pain:  Vitals:   03/05/20 0557  TempSrc: Oral         Complications: No complications documented.

## 2020-03-05 NOTE — Op Note (Signed)
03/05/2020 Cindy Gilbert [birth year] [id number]   PRE-OPERATIVE DIAGNOSIS:  Principal Problem:   Severe obesity (BMI 43.5)   Mixed hyperlipidemia   Prediabetes   Chronic right hip pain  POST-OPERATIVE DIAGNOSIS:  same  PROCEDURE:  Procedure(s): LAPAROSCOPIC SLEEVE GASTRECTOMY  UPPER GI ENDOSCOPY  SURGEON:  Surgeon(s): Gayland Curry, MD FACS FASMBS  ASSISTANTS: Romana Juniper MD FACS  ANESTHESIA:   general  DRAINS: none   BOUGIE: 40 fr ViSiGi  LOCAL MEDICATIONS USED:   Exparel  EBL: minimal  FINDINGS: potential bladder adhesed to lower abdominal wall at old c/s scar  SPECIMEN:  Source of Specimen:  Greater curvature of stomach  DISPOSITION OF SPECIMEN:  PATHOLOGY  COUNTS:  YES  INDICATION FOR PROCEDURE: This is a very pleasant 45 y.o.-year-old morbidly obese female who has had unsuccessful attempts for sustained weight loss. The patient presents today for a planned laparoscopic sleeve gastrectomy with upper endoscopy. We have discussed the risk and benefits of the procedure extensively preoperatively. Please see my separate notes.  PROCEDURE: After obtaining informed consent and receiving 5000 units of subcutaneous heparin, the patient was brought to the operating room at [facility] and placed supine on the operating room table. General endotracheal anesthesia was established. Sequential compression devices were placed. A orogastric tube was placed. The patient's abdomen was prepped and draped in the usual standard surgical fashion. The patient received preoperative IV antibiotic. A surgical timeout was performed. ERAS protocol used.   Access to the abdomen was achieved using a 5 mm 0 laparoscope thru a 5 mm trocar In the left upper Quadrant 2 fingerbreadths below the left subcostal margin using the Optiview technique. Pneumoperitoneum was smoothly established up to 15 mm of mercury. The laparoscope was advanced and the abdominal cavity was surveilled. The  patient was then placed in reverse Trendelenburg.   A 5 mm trocar was placed slightly above and to the left of the umbilicus under direct visualization.  The [facility] liver retractor was placed under the left lobe of the liver through a 5 mm trocar incision site in the subxiphoid position. A 5 mm trocar was placed in the lateral right upper quadrant along with a 15 mm trocar in the mid right abdomen. A final 5 mm trocar was placed in the lateral LUQ.  All under direct visualization after exparel had been infiltrated in bilateral lateral upper abdominal walls as a TAP block. It did appear that her bladder may be adhesed to her lower abdominal wall at the level of her c/s scar.   The stomach was inspected. It was completely decompressed and the orogastric tube was removed.  There was no anterior dimple that was obviously visible. Her preop UGI showed no hiatal hernia.  We identified the pylorus and measured 6 cm proximal to the pylorus and identified an area of where we would start taking down the short gastric vessels. Harmonic scalpel was used to take down the short gastric vessels along the greater curvature of the stomach. We were able to enter the lesser sac. We continued to march along the greater curvature of the stomach taking down the short gastrics. As we approached the gastrosplenic ligament we took care in this area not to injure the spleen. We were able to take down the entire gastrosplenic ligament. We then mobilized the fundus away from the left crus of diaphragm. There were not any significant posterior gastric avascular attachments. This left the stomach completely mobilized. No vessels had been taken down along the lesser curvature  of the stomach.  We then reidentified the pylorus. A 40Fr ViSiGi was then placed in the oropharynx and advanced down into the stomach and placed in the distal antrum and positioned along the lesser curvature. It was placed under suction which secured the 40Fr  ViSiGi in place along the lesser curve. Then using the [employer] echelon 60 mm stapler with a green load with Seamguard, I placed a stapler along the antrum approximately 5 cm from the pylorus. The stapler was angled so that there is ample room at the angularis incisura. I then fired the first staple load after inspecting it posteriorly to ensure adequate space both anteriorly and posteriorly. Her stomach did not appear thick.  At this point I still was not completely past the angularis so with a blue load with Seamguard, I placed the stapler in position just inside the prior stapleline. We then rotated the stomach to insure that there was adequate anteriorly as well as posteriorly. The stapler was then fired.  At this point I continued using 60 mm blue load staple cartridges with Seamguard. The echelon stapler was then repositioned with a 60 mm blue load with Seamguard and we continued to march up along the [place]. My assistant was holding traction along the greater curvature stomach along the cauterized short gastric vessels ensuring that the stomach was symmetrically retracted. Prior to each firing of the staple, we rotated the stomach to ensure that there is adequate stomach left.  As we approached the fundus, I used 60 mm blue cartridge with Seamguard aiming  lateral to the GE junction after mobilizing some of the esophageal fat pad.  The sleeve was inspected. There is no evidence of cork screw. The staple line appeared hemostatic. The CRNA inflated the ViSiGi to the green zone and the upper abdomen was flooded with saline. There were no bubbles. The sleeve was decompressed and the ViSiGi removed. My assistant scrubbed out and performed an upper endoscopy. The sleeve easily distended with air and the scope was easily advanced to the pylorus. There is no evidence of internal bleeding or cork screwing. There was no narrowing at the angularis. There is no evidence of bubbles. Please see her operative note for  further details. The gastric sleeve was decompressed and the endoscope was removed. I did place 3 clips at the very beginning of the staple line at the antrum where there had been a little bit of oozing.  The greater curvature the stomach was grasped with a laparoscopic grasper and removed from the 15 mm trocar site.  The liver retractor was removed. I then closed the 15 mm trocar site with 1 interrupted 0 Vicryl sutures through the fascia using the endoclose. The closure was viewed laparoscopically and it was airtight. Remaining Exparel was then infiltrated in the preperitoneal spaces around the trocar sites. Pneumoperitoneum was released. All trocar sites were closed with a 4-0 Monocryl in a subcuticular fashion followed by the application of  steri-strips, and tegaderms. The patient was extubated and taken to the recovery room in stable condition. All needle, instrument, and sponge counts were correct x2. There are no immediate complications  (1) 60 mm green with Seamguard (0) 60 mm gold with seamguard (4) 60 mm blue with 3 seamguard  PLAN OF CARE: Admit to inpatient   PATIENT DISPOSITION:  PACU - hemodynamically stable.   Delay start of Pharmacological VTE agent (>24hrs) due to surgical blood loss or risk of bleeding:  no  [name], MD, FACS FASMBS General,  Bariatric, & Minimally Invasive Surgery [facility]

## 2020-03-05 NOTE — Discharge Instructions (Signed)
GASTRIC BYPASS / [place] Instructions  These instructions are to help you care for yourself when you go home.  Call: If you have any problems. . Call [phone] and ask for the surgeon on call . If you have an emergency related to your surgery please use the ER at [facility].  . Tell the ER staff that you are a new post-op gastric bypass or gastric sleeve patient   Signs and symptoms to report: . Severe vomiting or nausea o If you cannot handle clear liquids for longer than 1 day, call your surgeon  . Abdominal pain which does not get better after taking your pain medication . Fever greater than 100.4 F and chills . Heart rate over 100 beats a minute . Trouble breathing . Chest pain .  Redness, swelling, drainage, or foul odor at incision (surgical) sites .  If your incisions open or pull apart . Swelling or pain in calf (lower leg) . Diarrhea (Loose bowel movements that happen often), frequent watery, uncontrolled bowel movements . Constipation, (no bowel movements for 3 days) if this happens:  o Take Milk of Magnesia, 2 tablespoons by mouth, 3 times a day for 2 days if needed o Stop taking Milk of Magnesia once you have had a bowel movement o Call your doctor if constipation continues Or o Take Miralax  (instead of Milk of Magnesia) following the label instructions o Stop taking Miralax once you have had a bowel movement o Call your doctor if constipation continues . Anything you think is "abnormal for you"   Normal side effects after surgery: . Unable to sleep at night or unable to concentrate . Irritability . Being tearful (crying) or depressed These are common complaints, possibly related to your anesthesia, stress of surgery and change in lifestyle, that usually go away a few weeks after surgery.  If these feelings continue, call your medical doctor.  Wound Care: You may have surgical glue, steri-strips, or staples over your incisions after surgery . Surgical  glue:  Looks like a clear film over your incisions and will wear off a little at a time . Steri-strips : Adhesive strips of tape over your incisions. You may notice a yellowish color on the skin under the steri-strips. This is used to make the   steri-strips stick better. Do not pull the steri-strips off - let them fall off . Staples: Jodell Cipro may be removed before you leave the hospital o If you go home with staples, call [facility] at for an appointment with your surgeon's nurse to have staples removed 10 days after surgery, [phone] . Showering: You may shower two (2) days after your surgery unless your surgeon tells you differently o Wash gently around incisions with warm soapy water, rinse well, and gently pat dry  o If you have a drain (tube from your incision), you may need someone to hold this while you shower  o No tub baths until staples are removed and incisions are healed     Medications: Marland Kitchen Medications should be liquid or crushed if larger than the size of a dime . Extended release pills (medication that releases a little bit at a time through the day) should not be crushed . Depending on the size and number of medications you take, you may need to space (take a few throughout the day)/change the time you take your medications so that you do not over-fill your pouch (smaller stomach) . Make sure you follow-up with  your primary care physician to make medication changes needed during rapid weight loss and life-style changes . If you have diabetes, follow up with the doctor that orders your diabetes medication(s) within one week after surgery and check your blood sugar regularly. . Do not drive while taking narcotics (pain medications) . DO NOT take NSAID'S (Examples of NSAID's include ibuprofen, naproxen)  Diet:                    First 2 Weeks  You will see the nutritionist about two (2) weeks after your surgery. The nutritionist will increase the types of foods you can  eat if you are handling liquids well: Marland Kitchen If you have severe vomiting or nausea and cannot handle clear liquids lasting longer than 1 day, call your surgeon  Protein Shake . Drink at least 2 ounces of shake 5-6 times per day . Each serving of protein shakes (usually 8 - 12 ounces) should have a minimum of:  o 15 grams of protein  o And no more than 5 grams of carbohydrate  . Goal for protein each day: o Men = 80 grams per day o Women = 60 grams per day . Protein powder may be added to fluids such as non-fat milk or Lactaid milk or Soy milk (limit to 35 grams added protein powder per serving)  Hydration . Slowly increase the amount of water and other clear liquids as tolerated (See Acceptable Fluids) . Slowly increase the amount of protein shake as tolerated  .  Sip fluids slowly and throughout the day . May use sugar substitutes in small amounts (no more than 6 - 8 packets per day; i.e. Splenda)  Fluid Goal . The first goal is to drink at least 8 ounces of protein shake/drink per day (or as directed by the nutritionist);  See handout from pre-op Bariatric Education Class for examples of protein shake/drink.   o Slowly increase the amount of protein shake you drink as tolerated o You may find it easier to slowly sip shakes throughout the day o It is important to get your proteins in first . Your fluid goal is to drink 64 - 100 ounces of fluid daily o It may take a few weeks to build up to this . 32 oz (or more) should be clear liquids  And  . 32 oz (or more) should be full liquids (see below for examples) . Liquids should not contain sugar, caffeine, or carbonation  Clear Liquids: . Water or Sugar-free flavored water (i.e. Fruit H2O, Propel) . Decaffeinated coffee or tea (sugar-free) . [employer], [employer], Minute [facility] . Sugar-free Jell-O . Bouillon or broth . Sugar-free Popsicle:   *Less than 20 calories each; Limit 1 per day  Full Liquids: Protein Shakes/Drinks + 2  choices per day of other full liquids . Full liquids must be: o No More Than 12 grams of Carbs per serving  o No More Than 3 grams of Fat per serving . Strained low-fat cream soup . Non-Fat milk . Fat-free Lactaid Milk . Sugar-free yogurt (Dannon Lite & Fit, Greek yogurt)      Vitamins and Minerals . Start 1 day after surgery unless otherwise directed by your surgeon . Bariatric Specific Complete Multivitamins . Chewable Calcium Citrate with Vitamin D-3 (Example: 3 Chewable Calcium Plus 600 with Vitamin D-3) o Take 500 mg three (3) times a day for a total of 1500 mg each day o Do not take all 3 doses  of calcium at one time as it may cause constipation, and you can only absorb 500 mg  at a time  o Do not mix multivitamins containing iron with calcium supplements; take 2 hours apart  . Menstruating women and those at risk for anemia (a blood disease that causes weakness) may need extra iron o Talk with your doctor to see if you need more iron . If you need extra iron: Total daily Iron recommendation (including Vitamins) is 50 to 100 mg Iron/day . Do not stop taking or change any vitamins or minerals until you talk to your nutritionist or surgeon . Your nutritionist and/or surgeon must approve all vitamin and mineral supplements   Activity and Exercise: It is important to continue walking at home.  Limit your physical activity as instructed by your doctor.  During this time, use these guidelines: . Do not lift anything greater than ten (10) pounds for at least two (2) weeks . Do not go back to work or drive until Engineer, production says you can . You may have sex when you feel comfortable  o It is VERY important for female patients to use a reliable birth control method; fertility often increases after surgery  o Do not get pregnant for at least 18 months . Start exercising as soon as your doctor tells you that you can o Make sure your doctor approves any physical activity . Start with a  simple walking program . Walk 5-15 minutes each day, 7 days per week.  . Slowly increase until you are walking 30-45 minutes per day Consider joining our [place] program. [phone] or email [email or web address]   Special Instructions Things to remember:  . Use your CPAP when sleeping if this applies to you, do not stop the use of CPAP unless directed by physician after a sleep study . [facility] has a free Bariatric Surgery Support Group that meets monthly, the 3rd Thursday, 6 pm.  Please review discharge information for date and location of this meeting. . It is very important to keep all follow up appointments with your surgeon, nutritionist, primary care physician, and behavioral health practitioner o After the first year, please follow up with your bariatric surgeon and nutritionist at least once a year in order to maintain best weight loss results   [facility]: [place]: [phone] Bariatric Nurse Coordinator: [phone]

## 2020-03-05 NOTE — Anesthesia Procedure Notes (Signed)
Procedure Name: Intubation Date/Time: 03/05/2020 7:27 AM Performed by: [place] Carlen Rebuck C, CRNA Pre-anesthesia Checklist: Patient identified, Emergency Drugs available, Suction available and Patient being monitored Patient Re-evaluated:Patient Re-evaluated prior to induction Oxygen Delivery Method: Circle system utilized Preoxygenation: Pre-oxygenation with 100% oxygen Induction Type: IV induction Ventilation: Mask ventilation without difficulty Laryngoscope Size: Mac and 3 Grade View: Grade II Tube type: Oral Tube size: 7.0 mm Number of attempts: 1 Airway Equipment and Method: Stylet and Oral airway Placement Confirmation: ETT inserted through vocal cords under direct vision,  positive ETCO2 and breath sounds checked- equal and bilateral Tube secured with: Tape Dental Injury: Teeth and Oropharynx as per pre-operative assessment

## 2020-03-05 NOTE — Anesthesia Postprocedure Evaluation (Signed)
Anesthesia Post Note  Patient: Occupational psychologist  Procedure(s) Performed: LAPAROSCOPIC GASTRIC SLEEVE RESECTION (N/A Abdomen) UPPER GI ENDOSCOPY (N/A )     Patient location during evaluation: PACU Anesthesia Type: General Level of consciousness: awake and alert Pain management: pain level controlled Vital Signs Assessment: post-procedure vital signs reviewed and stable Respiratory status: spontaneous breathing, nonlabored ventilation, respiratory function stable and patient connected to nasal cannula oxygen Cardiovascular status: blood pressure returned to baseline and stable Postop Assessment: no apparent nausea or vomiting Anesthetic complications: no   No complications documented.  Last Vitals:  Vitals:   03/05/20 0945 03/05/20 1000  BP: (!) 165/94 (!) 152/94  Pulse: 92 71  Resp:    Temp:  36.4 C  SpO2: 100% 96%    Last Pain:  Vitals:   03/05/20 1000  TempSrc: Oral  PainSc: 2                  Cartier Washko

## 2020-03-05 NOTE — Interval H&P Note (Signed)
History and Physical Interval Note:  03/05/2020 7:15 AM  Cindy Gilbert  has presented today for surgery, with the diagnosis of morbid obesity.  The various methods of treatment have been discussed with the patient and family. After consideration of risks, benefits and other options for treatment, the patient has consented to  Procedure(s): LAPAROSCOPIC GASTRIC SLEEVE RESECTION (N/A) UPPER GI ENDOSCOPY (N/A) as a surgical intervention.  The patient's history has been reviewed, patient examined, no change in status, stable for surgery.  I have reviewed the patient's chart and labs.  Questions were answered to the patient's satisfaction.    [name], MD, FACS General, Bariatric, & Minimally Invasive Surgery [facility]  [name]

## 2020-03-05 NOTE — Progress Notes (Signed)
Discussed post op day goals with patient including ambulation, IS, diet progression, pain, and nausea control.  BSTOP education provided including BSTOP information guide, "Guide for Pain Management after your Bariatric Procedure".  Questions answered.

## 2020-03-05 NOTE — Plan of Care (Signed)
Patient walked to bathroom upon arrival, voided 600cc, walked back to the chair, demonstrated proper use of IS reaching 1500. Pt will start drinking liquids at 11.

## 2020-03-06 ENCOUNTER — Encounter (HOSPITAL_COMMUNITY): Payer: Self-pay | Admitting: General Surgery

## 2020-03-06 LAB — CBC WITH DIFFERENTIAL/PLATELET
Abs Immature Granulocytes: 0.02 10*3/uL (ref 0.00–0.07)
Basophils Absolute: 0 10*3/uL (ref 0.0–0.1)
Basophils Relative: 0 %
Eosinophils Absolute: 0 10*3/uL (ref 0.0–0.5)
Eosinophils Relative: 0 %
HCT: 34.5 % — ABNORMAL LOW (ref 36.0–46.0)
Hemoglobin: 11.8 g/dL — ABNORMAL LOW (ref 12.0–15.0)
Immature Granulocytes: 0 %
Lymphocytes Relative: 22 %
Lymphs Abs: 1.9 10*3/uL (ref 0.7–4.0)
MCH: 27.8 pg (ref 26.0–34.0)
MCHC: 34.2 g/dL (ref 30.0–36.0)
MCV: 81.4 fL (ref 80.0–100.0)
Monocytes Absolute: 0.7 10*3/uL (ref 0.1–1.0)
Monocytes Relative: 8 %
Neutro Abs: 5.9 10*3/uL (ref 1.7–7.7)
Neutrophils Relative %: 70 %
Platelets: 208 10*3/uL (ref 150–400)
RBC: 4.24 MIL/uL (ref 3.87–5.11)
RDW: 13.6 % (ref 11.5–15.5)
WBC: 8.5 10*3/uL (ref 4.0–10.5)
nRBC: 0 % (ref 0.0–0.2)

## 2020-03-06 LAB — COMPREHENSIVE METABOLIC PANEL
ALT: 22 U/L (ref 0–44)
AST: 20 U/L (ref 15–41)
Albumin: 3.5 g/dL (ref 3.5–5.0)
Alkaline Phosphatase: 58 U/L (ref 38–126)
Anion gap: 9 (ref 5–15)
BUN: 7 mg/dL (ref 6–20)
CO2: 23 mmol/L (ref 22–32)
Calcium: 8.8 mg/dL — ABNORMAL LOW (ref 8.9–10.3)
Chloride: 101 mmol/L (ref 98–111)
Creatinine, Ser: 0.81 mg/dL (ref 0.44–1.00)
GFR, Estimated: >60 mL/min (ref >60)
Glucose, Bld: 144 mg/dL — ABNORMAL HIGH (ref 70–99)
Potassium: 4.1 mmol/L (ref 3.5–5.1)
Sodium: 133 mmol/L — ABNORMAL LOW (ref 135–145)
Total Bilirubin: 0.5 mg/dL (ref 0.3–1.2)
Total Protein: 6.7 g/dL (ref 6.5–8.1)

## 2020-03-06 NOTE — Progress Notes (Signed)
1 Day Post-Op   Subjective/Chief Complaint: Not really having any pain Just finished last water Having lots of nausea with water Walked some    Objective: Vital signs in last 24 hours: Temp:  [97.9 F (36.6 C)-99.2 F (37.3 C)] 98.2 F (36.8 C) (01/12 0956) Pulse Rate:  [56-83] 56 (01/12 0956) Resp:  [16-20] 18 (01/12 0956) BP: (143-164)/(85-100) 159/98 (01/12 0956) SpO2:  [98 %-100 %] 99 % (01/12 0956) Last BM Date: 03/04/20  Intake/Output from previous day: 01/11 0701 - 01/12 0700 In: 1969.5 [P.O.:180; I.V.:1689.5; IV Piggyback:100] Out: 1515 [Urine:1500; Blood:15] Intake/Output this shift: Total I/O In: 2161.7 [P.O.:180; I.V.:1981.7] Out: 1700 [Urine:1700]  Alert, nontoxic symm chest rise, nonlabored Reg Soft, approp TTP, incisions ok No edema  Lab Results:  Recent Labs    03/05/20 1116 03/06/20 0527  WBC  --  8.5  HGB 13.1 11.8*  HCT 39.7 34.5*  PLT  --  208   BMET Recent Labs    03/06/20 0527  NA 133*  K 4.1  CL 101  CO2 23  GLUCOSE 144*  BUN 7  CREATININE 0.81  CALCIUM 8.8*   PT/INR No results for input(s): LABPROT, INR in the last 72 hours. ABG No results for input(s): PHART, HCO3 in the last 72 hours.  Invalid input(s): PCO2, PO2  Studies/Results: No results found.  Anti-infectives: Anti-infectives (From admission, onward)   Start     Dose/Rate Route Frequency Ordered Stop   03/05/20 0600  cefoTEtan (CEFOTAN) 2 g in sodium chloride 0.9 % 100 mL IVPB        2 g 200 mL/hr over 30 Minutes Intravenous On call to O.R. 03/05/20 0546 03/05/20 0734      Assessment/Plan: s/p Procedure(s): LAPAROSCOPIC GASTRIC SLEEVE RESECTION (N/A) UPPER GI ENDOSCOPY (N/A)  No fever. No tachycardia Will adv to protein shakes Not sure if meet dc criteria today due to inadequate oral intake Cont chemical vte prophylaxis  [name], MD, FACS General, Bariatric, & Minimally Invasive Surgery [facility] [place]   LOS: 1 day     [name] 03/06/2020

## 2020-03-06 NOTE — Progress Notes (Addendum)
Patient alert and oriented, pain is controlled. Patient  advanced to protein shake today tolerated bari clear fluids.  Only  c/o of persistent nausea (Zofran/Phenergan ordered) no emesis. Encouraged patient to try small sips/walk.  Reviewed Gastric sleeve discharge instructions including s/s to report, wound care, medication, contact information, physical activity with patient and patient is able to articulate understanding.  RD Carmon Sails to review diet and vitamin education this afternoon.  Provided information on BELT program, Support Group and [facility] outpatient pharmacy. All questions answered, discussed plan of care with bedside RN.

## 2020-03-06 NOTE — Progress Notes (Signed)
Nutrition Education Note  Received verbal consult for diet education from Bariatric nurse coordinator.  Discussed 2 week post op diet with pt. Emphasized that liquids must be non carbonated, non caffeinated, sugar free and alcohol free. Fluid goals discussed. Pt to follow up with outpatient bariatric RD for further diet progression after 2 weeks. Multivitamins and minerals reviewed. Teach back method used, pt expressed understanding, expect good compliance.  Please consult for any other nutrition-related issue.  Clayton Bibles, MS, RD, LDN Inpatient Clinical Dietitian Contact information available via Amion

## 2020-03-07 LAB — CBC WITH DIFFERENTIAL/PLATELET
Abs Immature Granulocytes: 0.03 10*3/uL (ref 0.00–0.07)
Basophils Absolute: 0 10*3/uL (ref 0.0–0.1)
Basophils Relative: 0 %
Eosinophils Absolute: 0 10*3/uL (ref 0.0–0.5)
Eosinophils Relative: 0 %
HCT: 34.9 % — ABNORMAL LOW (ref 36.0–46.0)
Hemoglobin: 11.6 g/dL — ABNORMAL LOW (ref 12.0–15.0)
Immature Granulocytes: 0 %
Lymphocytes Relative: 29 %
Lymphs Abs: 2.4 10*3/uL (ref 0.7–4.0)
MCH: 27.6 pg (ref 26.0–34.0)
MCHC: 33.2 g/dL (ref 30.0–36.0)
MCV: 82.9 fL (ref 80.0–100.0)
Monocytes Absolute: 0.8 10*3/uL (ref 0.1–1.0)
Monocytes Relative: 10 %
Neutro Abs: 4.9 10*3/uL (ref 1.7–7.7)
Neutrophils Relative %: 61 %
Platelets: 183 10*3/uL (ref 150–400)
RBC: 4.21 MIL/uL (ref 3.87–5.11)
RDW: 13.9 % (ref 11.5–15.5)
WBC: 8.2 10*3/uL (ref 4.0–10.5)
nRBC: 0 % (ref 0.0–0.2)

## 2020-03-07 MED ORDER — ONDANSETRON 4 MG PO TBDP: 4.0000 mg | ORAL_TABLET | Freq: Four times a day (QID) | ORAL | 0 refills | Status: DC | PRN

## 2020-03-07 MED ORDER — ACETAMINOPHEN 500 MG PO TABS: 1000.0000 mg | ORAL_TABLET | Freq: Three times a day (TID) | ORAL | 0 refills | Status: AC

## 2020-03-07 MED ORDER — TRAMADOL HCL 50 MG PO TABS: 50.0000 mg | ORAL_TABLET | Freq: Four times a day (QID) | ORAL | 0 refills | Status: DC | PRN

## 2020-03-07 MED ORDER — PANTOPRAZOLE SODIUM 40 MG PO TBEC: 40.0000 mg | DELAYED_RELEASE_TABLET | Freq: Every day | ORAL | 0 refills | Status: DC

## 2020-03-07 NOTE — Discharge Summary (Signed)
Physician Discharge Summary  Cindy Gilbert KXF:[id number] DOB: [birth year] DOA: 03/05/2020  PCP: [name], DO  Admit date: 03/05/2020 Discharge date: 03/07/2020  Recommendations for Outpatient Follow-up:    Follow-up Information    [name], MD. Go on 03/27/2020.   Specialty: General Surgery Why: at 930 am.  Please arrive 15 minutes prior to appointment.  Thank you Contact information: [address] [place] [address] [phone]        [name], PA-C. Go on 04/23/2020.   Specialty: General Surgery Why: at 230pm.  Please arrive 15 minutes prior to appointment.  Thank you Contact information: [place] [address] [phone]              Discharge Diagnoses:  Principal Problem:   Severe obesity (BMI >= 40) (HCC) Active Problems:   Mixed hyperlipidemia   Prediabetes   Chronic right hip pain   S/P laparoscopic sleeve gastrectomy   Surgical Procedure: Laparoscopic Sleeve Gastrectomy, upper endoscopy  Discharge Condition: Good Disposition: Home  Diet recommendation: Postoperative sleeve gastrectomy diet (liquids only)  Filed Weights   03/05/20 0557 03/05/20 1000  Weight: 111.4 kg 111.5 kg     Hospital Course:  The patient was admitted for a planned laparoscopic sleeve gastrectomy. Please see operative note. Preoperatively the patient was given 5000 units of subcutaneous heparin for DVT prophylaxis. Postoperative prophylactic Lovenox dosing was started on the evening of postoperative day 0. ERAS protocol was used. On the evening of postoperative day 0, the patient was started on water and ice chips. On postoperative day 1 the patient had no fever or tachycardia and was tolerating water in their diet was gradually advanced throughout the day but did not meet criteria for discharge due to inadequate PO intake. She was having a fair amount of nausea.  The patient was ambulating without difficulty. On POD 2,  Their  vital signs are stable without fever or tachycardia. Their hemoglobin had remained stable. Her nausea had essentially resolved. Her oral intake had greatly improved. She was tolerating alkaline water. . The patient had received discharge instructions and counseling. They were deemed stable for discharge and had met discharge criteria  BP (!) 144/92 (BP Location: Left Arm)   Pulse (!) 54   Temp 98.6 F (37 C) (Oral)   Resp 18   Ht 5' 3"  (1.6 m)   Wt 111.5 kg   SpO2 100%   BMI 43.54 kg/m   Gen: alert, NAD, non-toxic appearing Pupils: equal, no scleral icterus Pulm: Lungs nonlabored, symmetric chest rise CV: regular rate and rhythm Abd: soft, nontender, nondistended.  No cellulitis. No incisional hernia Ext: no edema, no calf tenderness Skin: no rash, no jaundice   Discharge Instructions  Discharge Instructions    Ambulate hourly while awake   Complete by: As directed    Call MD for:  difficulty breathing, headache or visual disturbances   Complete by: As directed    Call MD for:  persistant dizziness or light-headedness   Complete by: As directed    Call MD for:  persistant nausea and vomiting   Complete by: As directed    Call MD for:  redness, tenderness, or signs of infection (pain, swelling, redness, odor or green/yellow discharge around incision site)   Complete by: As directed    Call MD for:  severe uncontrolled pain   Complete by: As directed    Call MD for:  temperature >101 F   Complete by:  As directed    Diet bariatric full liquid   Complete by: As directed    Discharge instructions   Complete by: As directed    See bariatric discharge instructions   Incentive spirometry   Complete by: As directed    Perform hourly while awake     Allergies as of 03/07/2020      Reactions   Ambien [zolpidem Tartrate] Itching   Belviq [lorcaserin] Itching   Bupropion Itching   Cymbalta [duloxetine Hcl] Other (See Comments)   Headache      Medication List    TAKE  these medications   acetaminophen 500 MG tablet Commonly known as: TYLENOL Take 2 tablets (1,000 mg total) by mouth every 8 (eight) hours for 5 days.   ALPRAZolam 0.5 MG tablet Commonly known as: XANAX Take 0.5-2 tablets (0.25-1 mg total) by mouth 2 (two) times daily as needed for anxiety.   fluticasone 50 MCG/ACT nasal spray Commonly known as: FLONASE Place 2 sprays into both nostrils daily. What changed:   when to take this  reasons to take this   multivitamin with minerals tablet Take 1 tablet by mouth daily.   ondansetron 4 MG disintegrating tablet Commonly known as: ZOFRAN-ODT Take 1 tablet (4 mg total) by mouth every 6 (six) hours as needed for nausea or vomiting.   pantoprazole 40 MG tablet Commonly known as: PROTONIX Take 1 tablet (40 mg total) by mouth daily.   traMADol 50 MG tablet Commonly known as: ULTRAM Take 1 tablet (50 mg total) by mouth every 6 (six) hours as needed (pain).   Vitamin D (Ergocalciferol) 1.25 MG (50000 UNIT) Caps capsule Commonly known as: DRISDOL TAKE 1 CAPSULE (50,000 UNITS TOTAL) BY MOUTH EVERY 7 (SEVEN) DAYS.       Follow-up Information    [name], MD. Go on 03/27/2020.   Specialty: General Surgery Why: at 930 am.  Please arrive 15 minutes prior to appointment.  Thank you Contact information: [address] [place] [address] [phone]        [name], PA-C. Go on 04/23/2020.   Specialty: General Surgery Why: at 230pm.  Please arrive 15 minutes prior to appointment.  Thank you Contact information: [address] [place] [address] [phone]                The results of significant diagnostics from this hospitalization (including imaging, microbiology, ancillary and laboratory) are listed below for reference.    Significant Diagnostic Studies: No results found.  Labs: Basic Metabolic Panel: Recent Labs  Lab 03/01/20 0954 03/06/20 0527  NA 141 133*  K 5.1 4.1  CL 104  101  CO2 27 23  GLUCOSE 103* 144*  BUN 17 7  CREATININE 0.77 0.81  CALCIUM 9.4 8.8*   Liver Function Tests: Recent Labs  Lab 03/01/20 0954 03/06/20 0527  AST 21 20  ALT 24 22  ALKPHOS 69 58  BILITOT 0.8 0.5  PROT 7.0 6.7  ALBUMIN 3.9 3.5    CBC: Recent Labs  Lab 03/01/20 0954 03/05/20 1116 03/06/20 0527 03/07/20 0547  WBC 5.4  --  8.5 8.2  NEUTROABS 2.3  --  5.9 4.9  HGB 12.7 13.1 11.8* 11.6*  HCT 39.0 39.7 34.5* 34.9*  MCV 84.4  --  81.4 82.9  PLT 222  --  208 183    CBG: No results for input(s): GLUCAP in the last 168 hours.  Principal Problem:   Severe obesity (BMI >=  40) ([place]) Active Problems:   Mixed hyperlipidemia   Prediabetes   Chronic right hip pain   S/P laparoscopic sleeve gastrectomy   Time coordinating discharge: 15 min  Signed:  Gayland Curry, MD [facility], [place] [phone] 03/07/2020, 10:09 AM

## 2020-03-07 NOTE — Progress Notes (Signed)
Discharge instructions given to patient and all questions were answered.

## 2020-03-12 ENCOUNTER — Telehealth (HOSPITAL_COMMUNITY): Payer: Self-pay

## 2020-03-13 ENCOUNTER — Telehealth (HOSPITAL_COMMUNITY): Payer: Self-pay

## 2020-03-13 ENCOUNTER — Telehealth (HOSPITAL_COMMUNITY): Payer: Self-pay | Admitting: *Deleted

## 2020-03-13 NOTE — Telephone Encounter (Signed)
Spoke with patient via phone to f/u since Gastric Sleeve on 03/05/20.  Pt discloses that she has tested positive for COVID-19 via home test on 03/12/20.  Pt c/o productive cough and fever as high as 101.62F. Pt has been taking tylenol to control fever.    Pt denies any pain, N/V/D.  Pt instructed to splint abdomen when coughing to control pain as needed. Pt has been able to tolerate fluids, stating that she has been able to drink approximately 32 oz of water and 16 oz of soup today (48oz total).   Pt able to verbalize S/S of dehydration.  Denies HA, palpitations or increase HR, dark urine, or lightheadedness.  Encouraged pt to continue to increase fluid intake to achieve goal of at least 64oz daily to progress and to maintain hydration during COVID-19 illness.   Pt also states that she has eaten approximately 45g of protein today via cottage cheese and some chicken in her soup.  Reinforced teaching of adhering to clear/full liquid diet until seen by RD on 03/19/20. Pt discloses that she is taking her MVI and calcium appropriately.  Pt did state that she has not had a BM in a "few days".  Pt encouraged to maintain hydration and to take MOM or Miralax according to D/C instructions as needed.    Pt states that incisions are free of any S/S of infection.  Denies any redness, warmth or draining.  Pt instructed to remove remaining gauze and occlusive dressings, but to leave steri strips in place and to keep them CDI.   With pt being COVID+, stressed the importance of staying hydrated and pushing fluids.  Pt instructed to call [facility] if any symptoms worsen or if she is not able to keep fluids down.  Will follow up with pt on Friday 03/15/20.

## 2020-03-15 ENCOUNTER — Telehealth (HOSPITAL_COMMUNITY): Payer: Self-pay | Admitting: *Deleted

## 2020-03-15 NOTE — Telephone Encounter (Signed)
F/u call with patient to reassess concerns from 03/13/20 conversation.  Pt has been afebrile and feeling much better since testing COVID-19 positive on 03/12/20 stating that the "worse part was earlier this week".  Pt did take Miralax for concerns of constipation.  Last BM was yesterday.  Pt has been able to drink more fluids and protein in past few days.  Pt reports that she has consumed 64oz fluid today and 30g of protein.  Pt stated that she will drink one more protein shake to get the 60g for today.  HR in the 90's per pt.  Pt still encouraged to drink for fluids.  Pt able to recite the S/S of dehydration and encourage to call [facility] if she has any further concerns over the weekend.  Pt states that she "will be able to make it to Tuesday" when she goes for her f/u nutrition appt.  Per CDC, pt will be outside of isolation window prior to appt.

## 2020-03-18 ENCOUNTER — Other Ambulatory Visit: Payer: Self-pay | Admitting: Student

## 2020-03-18 ENCOUNTER — Other Ambulatory Visit: Payer: Self-pay

## 2020-03-18 ENCOUNTER — Inpatient Hospital Stay (HOSPITAL_COMMUNITY)
Admission: EM | Admit: 2020-03-18 | Discharge: 2020-03-23 | DRG: 178 | Disposition: A | Discharge status: Home / Self Care | Payer: 59 | Source: Ambulatory Visit | Attending: Internal Medicine | Admitting: Internal Medicine

## 2020-03-18 ENCOUNTER — Inpatient Hospital Stay (HOSPITAL_COMMUNITY): Payer: 59

## 2020-03-18 ENCOUNTER — Emergency Department (HOSPITAL_COMMUNITY): Payer: 59

## 2020-03-18 ENCOUNTER — Encounter (HOSPITAL_COMMUNITY): Payer: Self-pay | Admitting: *Deleted

## 2020-03-18 DIAGNOSIS — Z87891 Personal history of nicotine dependence: Secondary | ICD-10-CM | POA: Diagnosis not present

## 2020-03-18 DIAGNOSIS — Z83438 Family history of other disorder of lipoprotein metabolism and other lipidemia: Secondary | ICD-10-CM | POA: Diagnosis not present

## 2020-03-18 DIAGNOSIS — D5919 Other autoimmune hemolytic anemia: Secondary | ICD-10-CM | POA: Diagnosis present

## 2020-03-18 DIAGNOSIS — F32A Depression, unspecified: Secondary | ICD-10-CM | POA: Diagnosis present

## 2020-03-18 DIAGNOSIS — D649 Anemia, unspecified: Secondary | ICD-10-CM

## 2020-03-18 DIAGNOSIS — F320 Major depressive disorder, single episode, mild: Secondary | ICD-10-CM | POA: Diagnosis not present

## 2020-03-18 DIAGNOSIS — E119 Type 2 diabetes mellitus without complications: Secondary | ICD-10-CM | POA: Diagnosis present

## 2020-03-18 DIAGNOSIS — Z823 Family history of stroke: Secondary | ICD-10-CM | POA: Diagnosis not present

## 2020-03-18 DIAGNOSIS — E559 Vitamin D deficiency, unspecified: Secondary | ICD-10-CM | POA: Diagnosis not present

## 2020-03-18 DIAGNOSIS — D599 Acquired hemolytic anemia, unspecified: Secondary | ICD-10-CM

## 2020-03-18 DIAGNOSIS — Z79899 Other long term (current) drug therapy: Secondary | ICD-10-CM | POA: Diagnosis not present

## 2020-03-18 DIAGNOSIS — R5382 Chronic fatigue, unspecified: Secondary | ICD-10-CM | POA: Diagnosis not present

## 2020-03-18 DIAGNOSIS — R17 Unspecified jaundice: Secondary | ICD-10-CM | POA: Diagnosis present

## 2020-03-18 DIAGNOSIS — E785 Hyperlipidemia, unspecified: Secondary | ICD-10-CM | POA: Diagnosis present

## 2020-03-18 DIAGNOSIS — R7402 Elevation of levels of lactic acid dehydrogenase (LDH): Secondary | ICD-10-CM | POA: Diagnosis not present

## 2020-03-18 DIAGNOSIS — E042 Nontoxic multinodular goiter: Secondary | ICD-10-CM | POA: Diagnosis not present

## 2020-03-18 DIAGNOSIS — Z7901 Long term (current) use of anticoagulants: Secondary | ICD-10-CM | POA: Diagnosis not present

## 2020-03-18 DIAGNOSIS — F419 Anxiety disorder, unspecified: Secondary | ICD-10-CM | POA: Diagnosis not present

## 2020-03-18 DIAGNOSIS — Z8249 Family history of ischemic heart disease and other diseases of the circulatory system: Secondary | ICD-10-CM | POA: Diagnosis not present

## 2020-03-18 DIAGNOSIS — R300 Dysuria: Secondary | ICD-10-CM | POA: Diagnosis present

## 2020-03-18 DIAGNOSIS — Z9884 Bariatric surgery status: Secondary | ICD-10-CM

## 2020-03-18 DIAGNOSIS — K5900 Constipation, unspecified: Secondary | ICD-10-CM | POA: Diagnosis present

## 2020-03-18 DIAGNOSIS — E860 Dehydration: Secondary | ICD-10-CM | POA: Diagnosis present

## 2020-03-18 DIAGNOSIS — R7303 Prediabetes: Secondary | ICD-10-CM | POA: Diagnosis present

## 2020-03-18 DIAGNOSIS — U071 COVID-19: Principal | ICD-10-CM | POA: Diagnosis present

## 2020-03-18 DIAGNOSIS — R519 Headache, unspecified: Secondary | ICD-10-CM | POA: Diagnosis not present

## 2020-03-18 HISTORY — DX: COVID-19: U07.1

## 2020-03-18 LAB — CBC WITH DIFFERENTIAL/PLATELET
Abs Immature Granulocytes: 0.7 10*3/uL — ABNORMAL HIGH (ref 0.00–0.07)
Basophils Absolute: 0 10*3/uL (ref 0.0–0.1)
Basophils Relative: 0 %
Eosinophils Absolute: 0.1 10*3/uL (ref 0.0–0.5)
Eosinophils Relative: 1 %
HCT: 19 % — ABNORMAL LOW (ref 36.0–46.0)
Hemoglobin: 6.1 g/dL — CL (ref 12.0–15.0)
Immature Granulocytes: 5 %
Lymphocytes Relative: 23 %
Lymphs Abs: 3 10*3/uL (ref 0.7–4.0)
MCH: 29.2 pg (ref 26.0–34.0)
MCHC: 32.1 g/dL (ref 30.0–36.0)
MCV: 90.9 fL (ref 80.0–100.0)
Monocytes Absolute: 0.5 10*3/uL (ref 0.1–1.0)
Monocytes Relative: 4 %
Neutro Abs: 8.9 10*3/uL — ABNORMAL HIGH (ref 1.7–7.7)
Neutrophils Relative %: 67 %
Platelets: 308 10*3/uL (ref 150–400)
RBC: 2.09 MIL/uL — ABNORMAL LOW (ref 3.87–5.11)
RDW: 15.4 % (ref 11.5–15.5)
WBC: 13.3 10*3/uL — ABNORMAL HIGH (ref 4.0–10.5)
nRBC: 1.1 % — ABNORMAL HIGH (ref 0.0–0.2)

## 2020-03-18 LAB — FOLATE: Folate: 16.5 ng/mL (ref >5.9)

## 2020-03-18 LAB — URINALYSIS, ROUTINE W REFLEX MICROSCOPIC
Bilirubin Urine: NEGATIVE
Glucose, UA: NEGATIVE mg/dL
Hgb urine dipstick: NEGATIVE
Ketones, ur: 5 mg/dL — AB
Nitrite: NEGATIVE
Protein, ur: NEGATIVE mg/dL
Specific Gravity, Urine: 1.009 (ref 1.005–1.030)
pH: 6 (ref 5.0–8.0)

## 2020-03-18 LAB — BILIRUBIN, DIRECT: Bilirubin, Direct: 0.7 mg/dL — ABNORMAL HIGH (ref 0.0–0.2)

## 2020-03-18 LAB — COMPREHENSIVE METABOLIC PANEL
ALT: 22 U/L (ref 0–44)
AST: 38 U/L (ref 15–41)
Albumin: 4.4 g/dL (ref 3.5–5.0)
Alkaline Phosphatase: 61 U/L (ref 38–126)
Anion gap: 12 (ref 5–15)
BUN: 16 mg/dL (ref 6–20)
CO2: 23 mmol/L (ref 22–32)
Calcium: 9.4 mg/dL (ref 8.9–10.3)
Chloride: 98 mmol/L (ref 98–111)
Creatinine, Ser: 0.78 mg/dL (ref 0.44–1.00)
GFR, Estimated: >60 mL/min (ref >60)
Glucose, Bld: 112 mg/dL — ABNORMAL HIGH (ref 70–99)
Potassium: 4.1 mmol/L (ref 3.5–5.1)
Sodium: 133 mmol/L — ABNORMAL LOW (ref 135–145)
Total Bilirubin: 4.3 mg/dL — ABNORMAL HIGH (ref 0.3–1.2)
Total Protein: 7.8 g/dL (ref 6.5–8.1)

## 2020-03-18 LAB — IRON AND TIBC
Iron: 161 ug/dL (ref 28–170)
Saturation Ratios: 64 % — ABNORMAL HIGH (ref 10.4–31.8)
TIBC: 253 ug/dL (ref 250–450)
UIBC: 92 ug/dL

## 2020-03-18 LAB — RETICULOCYTES
Immature Retic Fract: 45 % — ABNORMAL HIGH (ref 2.3–15.9)
RBC.: 2.11 MIL/uL — ABNORMAL LOW (ref 3.87–5.11)
Retic Count, Absolute: 117.5 10*3/uL (ref 19.0–186.0)
Retic Ct Pct: 5.6 % — ABNORMAL HIGH (ref 0.4–3.1)

## 2020-03-18 LAB — APTT: aPTT: 30 seconds (ref 24–36)

## 2020-03-18 LAB — I-STAT BETA HCG BLOOD, ED (MC, WL, AP ONLY): I-stat hCG, quantitative: 6.7 m[IU]/mL — ABNORMAL HIGH (ref <5)

## 2020-03-18 LAB — LACTATE DEHYDROGENASE: LDH: 774 U/L — ABNORMAL HIGH (ref 98–192)

## 2020-03-18 LAB — PROTIME-INR
INR: 1.2 (ref 0.8–1.2)
Prothrombin Time: 14.5 seconds (ref 11.4–15.2)

## 2020-03-18 LAB — PREPARE RBC (CROSSMATCH)

## 2020-03-18 LAB — FERRITIN: Ferritin: 1433 ng/mL — ABNORMAL HIGH (ref 11–307)

## 2020-03-18 LAB — MAGNESIUM: Magnesium: 2.4 mg/dL (ref 1.7–2.4)

## 2020-03-18 LAB — POC OCCULT BLOOD, ED: Fecal Occult Bld: NEGATIVE

## 2020-03-18 LAB — VITAMIN B12: Vitamin B-12: 471 pg/mL (ref 180–914)

## 2020-03-18 MED ORDER — SODIUM CHLORIDE 0.9 % IV SOLN
200.0000 mg | Freq: Once | INTRAVENOUS | Status: AC
  Administered 2020-03-18: 200 mg via INTRAVENOUS
  Filled 2020-03-18: qty 200

## 2020-03-18 MED ORDER — SODIUM CHLORIDE 0.9 % IV BOLUS
1000.0000 mL | Freq: Once | INTRAVENOUS | Status: AC
  Administered 2020-03-18: 1000 mL via INTRAVENOUS

## 2020-03-18 MED ORDER — SODIUM CHLORIDE 0.9% IV SOLUTION: Freq: Once | INTRAVENOUS | Status: DC

## 2020-03-18 MED ORDER — THIAMINE HCL 100 MG/ML IJ SOLN
Freq: Once | INTRAVENOUS | Status: AC
  Filled 2020-03-18: qty 1000

## 2020-03-18 MED ORDER — PANTOPRAZOLE SODIUM 40 MG IV SOLR
40.0000 mg | Freq: Once | INTRAVENOUS | Status: AC
  Administered 2020-03-18: 40 mg via INTRAVENOUS
  Filled 2020-03-18: qty 40

## 2020-03-18 MED ORDER — SODIUM CHLORIDE 0.9 % IV SOLN
100.0000 mg | Freq: Every day | INTRAVENOUS | Status: AC
  Administered 2020-03-19 – 2020-03-22 (×4): 100 mg via INTRAVENOUS
  Filled 2020-03-18 (×4): qty 20

## 2020-03-18 NOTE — ED Notes (Signed)
Date and time results received: 03/18/20 5:00 PM   Test: Hemoglobin Critical Value: 6.1  Name of Provider Notified: Ralene Bathe, MD    Orders Received? Or Actions Taken?:

## 2020-03-18 NOTE — ED Triage Notes (Signed)
2 weeks s/p gastric band surgery, fever at home, tachycardia, weakness, went to clinic for f/u Dr [name] sent her here.

## 2020-03-18 NOTE — H&P (Signed)
Cindy Gilbert WYO:[id number] DOB: 19-Sep-1975 DOA: 03/18/2020     PCP: [name], DO   Outpatient Specialists:     cardiology   Berniece Salines, DO General surgery Dr. Rock Nephew  Patient arrived to ER on 03/18/20 at 1545 Referred by Attending Quintella Reichert, MD   Patient coming from: home Lives alone,  Chief Complaint:   Chief Complaint  Patient presents with  . Covid Positive  . Tachycardia  . Weakness    HPI: Cindy Gilbert is a 45 y.o. female with medical history significant of obesity, HLD,  Status post recent laparoscopic sleeve gastrectomy  Presented with   tachycardia and weakness On January 11 patient undergone laparoscopic sleeve gastrectomy She did well her diet was advanced she was given Lovenox for DVT prophylaxis. She was able to be discharged on 13 January  On 18 January intake patient developed cough and fever up to 101.7 tested positive for Covid Patient states she had not had contact with anybody since her discharge Also endorses some constipation. His symptoms started to improve somewhat but still remain tachycardic and feeling lightheaded and dehydrated. She was seen in clinic by Dr. [name] and was sent to emergency department  Denies any black stools no heavy menstrual. She had a hematoma in her thigh about a size of her fist but that has resolved since.   Last fever was 4 days ago  iInfectious risk factors:  Reports  fever, shortness of breath, dry cough,     KNOWN COVID POSITIVE   Has   been vaccinated against COVID not boosted   Initial COVID TEST    POSITIVE,     Lab Results  Component Value Date   SARSCOV2NAA POSITIVE (A) 03/18/2020   [place] NEGATIVE 03/01/2020   [place] Not Detected 09/15/2018    Regarding pertinent Chronic problems:     Hyperlipidemia -not on on statins Lipid Panel     Component Value Date/Time   CHOL 220 (H) 01/23/2019 1453   TRIG 61 01/23/2019 1453   HDL 73 01/23/2019 1453   LDLCALC  136 (H) 01/23/2019 1453   LABVLDL 11 01/23/2019 1453     HTN on not on any home meds     Morbid obesity-   BMI Readings from Last 1 Encounters:  03/18/20 41.10 kg/m   Status post recent laparoscopic sleeve gastrectomy   baseline hg Hemoglobin & Hematocrit  Recent Labs    03/06/20 0527 03/07/20 0547 03/18/20 1621  HGB 11.8* 11.6* 6.1*    While in ER: In emergency department noted to have scleral icterus and tachycardia also noted to have new onset anemia hemoglobin down to 6.1 Total bilirubin was 4.3 direct bilirubin been 0.7 Noted a white blood cell count 13.3  LDH elevated 774 Ferritin 1433  ER Provider Called:   Oncology DrMarland Kitchen  They Recommend admit to medicine obtain lysis labs and transfuse 1 unit Will see in AM  Hospitalist was called for admission for anemia thought to be likely secondary to hemolysis and associated Covid infection  The following Work up has been ordered so far:  Orders Placed This Encounter  Procedures  . Urine culture  . SARS CORONAVIRUS 2 (TAT 6-24 HRS) Nasopharyngeal Nasopharyngeal Swab  . CT Abdomen Pelvis Wo Contrast  . Comprehensive metabolic panel  . CBC with Differential  . Urinalysis, Routine w reflex microscopic  . Magnesium  . Protime-INR  . Vitamin B12  . Iron and TIBC  . Ferritin  . Folate  . Reticulocytes  .  APTT  . Bilirubin, direct  . Lactate dehydrogenase  . C3 complement  . C4 complement  . Haptoglobin  . Cardiac monitoring  . Consult to general surgery  ALL PATIENTS BEING ADMITTED/HAVING PROCEDURES NEED COVID-19 SCREENING  . Consult to hematology  ALL PATIENTS BEING ADMITTED/HAVING PROCEDURES NEED COVID-19 SCREENING  . Consult to hospitalist  ALL PATIENTS BEING ADMITTED/HAVING PROCEDURES NEED COVID-19 SCREENING  . Airborne and Contact precautions  . I-Stat beta hCG blood, ED  . POC occult blood, ED  . POC occult blood, ED  . ED EKG  . EKG 12-Lead  . Type and screen [place]  . Prepare  RBC (crossmatch)  . Direct antiglobulin test    Following Medications were ordered in ER: Medications  0.9 %  sodium chloride infusion (Manually program via Guardrails IV Fluids) (0 mLs Intravenous Hold 03/18/20 1936)  sodium chloride 0.9 % bolus 1,000 mL (0 mLs Intravenous Stopped 03/18/20 2003)  sodium chloride 0.9 % 1,000 mL with thiamine 562 mg, folic acid 1 mg, multivitamins adult 10 mL infusion ( Intravenous New Bag/Given 03/18/20 2006)  pantoprazole (PROTONIX) injection 40 mg (40 mg Intravenous Given 03/18/20 1751)        Consult Orders  (From admission, onward)         Start     Ordered   03/18/20 2222  Consult to hospitalist  ALL PATIENTS BEING ADMITTED/HAVING PROCEDURES NEED COVID-19 SCREENING  Once       Comments: ALL PATIENTS BEING ADMITTED/HAVING PROCEDURES NEED COVID-19 SCREENING  Provider:  (Not yet assigned)  Question Answer Comment  Place call to: [facility]   Reason for Consult Admit      03/18/20 2221           Significant initial  Findings: Abnormal Labs Reviewed  COMPREHENSIVE METABOLIC PANEL - Abnormal; Notable for the following components:      Result Value   Sodium 133 (*)    Glucose, Bld 112 (*)    Total Bilirubin 4.3 (*)    All other components within normal limits  CBC WITH DIFFERENTIAL/PLATELET - Abnormal; Notable for the following components:   WBC 13.3 (*)    RBC 2.09 (*)    Hemoglobin 6.1 (*)    HCT 19.0 (*)    nRBC 1.1 (*)    Neutro Abs 8.9 (*)    Abs Immature Granulocytes 0.70 (*)    All other components within normal limits  URINALYSIS, ROUTINE W REFLEX MICROSCOPIC - Abnormal; Notable for the following components:   APPearance HAZY (*)    Ketones, ur 5 (*)    Leukocytes,Ua LARGE (*)    Bacteria, UA RARE (*)    All other components within normal limits  IRON AND TIBC - Abnormal; Notable for the following components:   Saturation Ratios 64 (*)    All other components within normal limits  FERRITIN - Abnormal; Notable for the  following components:   Ferritin 1,433 (*)    All other components within normal limits  RETICULOCYTES - Abnormal; Notable for the following components:   Retic Ct Pct 5.6 (*)    RBC. 2.11 (*)    Immature Retic Fract 45.0 (*)    All other components within normal limits  BILIRUBIN, DIRECT - Abnormal; Notable for the following components:   Bilirubin, Direct 0.7 (*)    All other components within normal limits  LACTATE DEHYDROGENASE - Abnormal; Notable for the following components:   LDH 774 (*)    All other components  within normal limits  I-STAT BETA HCG BLOOD, ED ([facility], [facility], [facility] ONLY) - Abnormal; Notable for the following components:   I-stat hCG, quantitative 6.7 (*)    All other components within normal limits    Otherwise labs showing:    Recent Labs  Lab 03/18/20 1621  NA 133*  K 4.1  CO2 23  GLUCOSE 112*  BUN 16  CREATININE 0.78  CALCIUM 9.4  MG 2.4    Cr  stable,   Lab Results  Component Value Date   CREATININE 0.78 03/18/2020   CREATININE 0.81 03/06/2020   CREATININE 0.77 03/01/2020    Recent Labs  Lab 03/18/20 1621  AST 38  ALT 22  ALKPHOS 61  BILITOT 4.3*  PROT 7.8  ALBUMIN 4.4   Lab Results  Component Value Date   CALCIUM 9.4 03/18/2020     WBC      Component Value Date/Time   WBC 13.3 (H) 03/18/2020 1621   LYMPHSABS 3.0 03/18/2020 1621   LYMPHSABS 3.5 (H) 01/23/2019 1453   MONOABS 0.5 03/18/2020 1621   EOSABS 0.1 03/18/2020 1621   EOSABS 0.1 01/23/2019 1453   BASOSABS 0.0 03/18/2020 1621   BASOSABS 0.1 01/23/2019 1453    Plt: Lab Results  Component Value Date   PLT 308 03/18/2020      Procalcitonin   Ordered   COVID-19 Labs  Recent Labs    03/18/20 1621 03/18/20 2047  FERRITIN 1,433*  --   LDH  --  774*    Lab Results  Component Value Date   SARSCOV2NAA POSITIVE (A) 03/18/2020   [place] NEGATIVE 03/01/2020   [place] Not Detected 09/15/2018    HG/HCT      Down  from baseline see below    Component Value  Date/Time   HGB 6.1 (LL) 03/18/2020 1621   HGB 13.4 11/07/2019 1558   HCT 19.0 (L) 03/18/2020 1621   HCT 41.5 11/07/2019 1558   MCV 90.9 03/18/2020 1621   MCV 84 11/07/2019 1558     Troponin  ordered   ECG: Ordered Personally reviewed by me showing: HR : 108 Rhythm:  Sinus tachycardia     no evidence of ischemic changes QTC 439     UA leukocytes and RBC    Urine analysis:    Component Value Date/Time   COLORURINE YELLOW 03/18/2020 2040   APPEARANCEUR HAZY (A) 03/18/2020 2040   APPEARANCEUR Clear 01/23/2019 1449   LABSPEC 1.009 03/18/2020 2040   PHURINE 6.0 03/18/2020 2040   GLUCOSEU NEGATIVE 03/18/2020 2040   HGBUR NEGATIVE 03/18/2020 2040   BILIRUBINUR NEGATIVE 03/18/2020 2040   BILIRUBINUR negative 11/07/2019 1521   BILIRUBINUR Negative 01/23/2019 1449   KETONESUR 5 (A) 03/18/2020 2040   PROTEINUR NEGATIVE 03/18/2020 2040   UROBILINOGEN 0.2 11/07/2019 1521   UROBILINOGEN 0.2 07/14/2014 2041   NITRITE NEGATIVE 03/18/2020 2040   LEUKOCYTESUR LARGE (A) 03/18/2020 2040   Ordered  CXR -  NON acute  CTabd/pelvis -  Postsurgical changes of the stomach consistent with history of gastric sleeve surgery.  Borderline splenomegaly    ED Triage Vitals  Enc Vitals Group     BP 03/18/20 1603 136/80     Pulse Rate 03/18/20 1603 (!) 105     Resp 03/18/20 1603 20     Temp 03/18/20 1603 99.3 F (37.4 C)     Temp Source 03/18/20 1603 Oral     SpO2 03/18/20 1603 100 %     Weight 03/18/20 1607 232 lb (105.2 kg)  Height 03/18/20 1607 5' 3"  (1.6 m)     Head Circumference --      Peak Flow --      Pain Score 03/18/20 1604 0     Pain Loc --      Pain Edu? --      Excl. in [place]? --   TMAX(24)@       Latest  Blood pressure 116/75, pulse (!) 110, temperature 99.8 F (37.7 C), resp. rate 20, height 5' 3"  (1.6 m), weight 105.2 kg, SpO2 100 %.    Review of Systems:    Pertinent positives include:   Fevers, chills, fatigue, weight loss  loss of appetite HEENT:    Constitutional:  No weight loss, night sweats, No headaches, Difficulty swallowing,Tooth/dental problems,Sore throat,  No sneezing, itching, ear ache, nasal congestion, post nasal drip,  Cardio-vascular:  No chest pain, Orthopnea, PND, anasarca, dizziness, palpitations.no Bilateral lower extremity swelling  GI:  No heartburn, indigestion, abdominal pain, nausea, vomiting, diarrhea, change in bowel habits,, melena, blood in stool, hematemesis Resp:  no shortness of breath at rest. No dyspnea on exertion, No excess mucus, no productive cough, No non-productive cough, No coughing up of blood.No change in color of mucus.No wheezing. Skin:  no rash or lesions. No jaundice GU:  no dysuria, change in color of urine, no urgency or frequency. No straining to urinate.  No flank pain.  Musculoskeletal:  No joint pain or no joint swelling. No decreased range of motion. No back pain.  Psych:  No change in mood or affect. No depression or anxiety. No memory loss.  Neuro: no localizing neurological complaints, no tingling, no weakness, no double vision, no gait abnormality, no slurred speech, no confusion  All systems reviewed and apart from [place] all are negative  Past Medical History:   Past Medical History:  Diagnosis Date  . Allergy    Seasonal  . Anxiety 03/22/2018  . Controlled substance agreement signed 12/18/2016   For xanax  . COVID-19   . Depression    previously on lexapro, celexa,   . Dyslipidemia   . Heart murmur 12/27/2019  . Hip pain, chronic    Right  . Insomnia   . Morbid obesity ([place]) 10/15/2019  . Multinodular goiter (nontoxic) 03/16/2019   Small nodules- no need for bx or follow up imaging 2020  . Ovarian cyst    left  . Right knee pain   . Vitamin D deficiency       Past Surgical History:  Procedure Laterality Date  . ADENOIDECTOMY    . CESAREAN SECTION  2001  . DILATION AND CURETTAGE OF UTERUS    . LAPAROSCOPIC GASTRIC SLEEVE RESECTION N/A 03/05/2020    Procedure: LAPAROSCOPIC GASTRIC SLEEVE RESECTION;  Surgeon: [name], MD;  Location: [facility];  Service: General;  Laterality: N/A;  . TONSILLECTOMY    . UPPER GI ENDOSCOPY N/A 03/05/2020   Procedure: UPPER GI ENDOSCOPY;  Surgeon: [name], MD;  Location: [facility];  Service: General;  Laterality: N/A;    Social History:  Ambulatory  independently      reports that she quit smoking about 6 years ago. Her smoking use included cigarettes. She has a 2.50 pack-year smoking history. She has never used smokeless tobacco. She reports current alcohol use of about 5.0 standard drinks of alcohol per week. She reports that she does not use drugs.     Family History:   Family History  Problem Relation Age of Onset  . Heart disease  Father   . Stroke Father   . Heart attack Father   . Hypertension Father   . Atrial fibrillation Maternal Aunt   . Cancer Paternal Aunt        Breast  . Heart murmur Maternal Grandmother   . Asthma Paternal Grandmother   . Hypertension Mother   . Hyperlipidemia Mother     Allergies: Allergies  Allergen Reactions  . Ambien [Zolpidem Tartrate] Itching  . Belviq [Lorcaserin] Itching  . Bupropion Itching  . Cymbalta [Duloxetine Hcl] Other (See Comments)    Headache     Prior to Admission medications   Medication Sig Start Date End Date Taking? Authorizing Provider  ALPRAZolam Duanne Moron) 0.5 MG tablet Take 0.5-2 tablets (0.25-1 mg total) by mouth 2 (two) times daily as needed for anxiety. 07/28/19   Johnson, Megan P, DO  fluticasone (FLONASE) 50 MCG/ACT nasal spray Place 2 sprays into both nostrils daily. Patient taking differently: Place 2 sprays into both nostrils daily as needed for allergies. 06/10/18   Park Liter P, DO  Multiple Vitamins-Minerals (MULTIVITAMIN WITH MINERALS) tablet Take 1 tablet by mouth daily.    [provider]  ondansetron (ZOFRAN-ODT) 4 MG disintegrating tablet Take 1 tablet (4 mg total) by mouth every 6 (six) hours as  needed for nausea or vomiting. 03/07/20   [name], MD  pantoprazole (PROTONIX) 40 MG tablet Take 1 tablet (40 mg total) by mouth daily. 03/07/20   [name], MD  traMADol (ULTRAM) 50 MG tablet Take 1 tablet (50 mg total) by mouth every 6 (six) hours as needed (pain). 03/07/20   [name], MD  Vitamin D, Ergocalciferol, (DRISDOL) 1.25 MG (50000 UNIT) CAPS capsule TAKE 1 CAPSULE (50,000 UNITS TOTAL) BY MOUTH EVERY 7 (SEVEN) DAYS. Patient not taking: Reported on 02/19/2020 01/25/20   [name], DO   Physical Exam: Vitals with BMI 03/18/2020 03/18/2020 03/18/2020  Height - - -  Weight - - -  BMI - - -  Systolic 976 734 193  Diastolic 75 72 68  Pulse 790 109 112     1. General:  in No  Acute distress   Chronically ill  -appearing 2. Psychological: Alert and Oriented 3. Head/ENT:   Dry Mucous Membranes                          Head Non traumatic, neck supple                           Poor Dentition 4. SKIN:  decreased Skin turgor,  Skin clean Dry and intact no rash 5. Heart: Regular rate and rhythm no  Murmur, no Rub or gallop 6. Lungs:  , no wheezes or crackles   7. Abdomen: Soft,  non-tender, surgical site healing Non distended   obese  bowel sounds present 8. Lower extremities: no clubbing, cyanosis, no  edema 9. Neurologically Grossly intact, moving all 4 extremities equally  10. MSK: Normal range of motion   All other LABS:     Recent Labs  Lab 03/18/20 1621  WBC 13.3*  NEUTROABS 8.9*  HGB 6.1*  HCT 19.0*  MCV 90.9  PLT 308     Recent Labs  Lab 03/18/20 1621  NA 133*  K 4.1  CL 98  CO2 23  GLUCOSE 112*  BUN 16  CREATININE 0.78  CALCIUM 9.4  MG 2.4     Recent Labs  Lab 03/18/20  1621  AST 38  ALT 22  ALKPHOS 61  BILITOT 4.3*  PROT 7.8  ALBUMIN 4.4       Cultures: No results found for: SDES, SPECREQUEST, CULT, REPTSTATUS   Radiological Exams on Admission: CT Abdomen Pelvis Wo Contrast  Result Date: 03/18/2020 CLINICAL DATA:  Postop  gastric sleeve fever EXAM: CT ABDOMEN AND PELVIS WITHOUT CONTRAST TECHNIQUE: Multidetector CT imaging of the abdomen and pelvis was performed following the standard protocol without IV contrast. COMPARISON:  None. FINDINGS: Lower chest: Lung bases demonstrate no acute consolidation or effusion. Differential cardiac blood pool suggesting anemia. Hepatobiliary: No focal liver abnormality is seen. No gallstones, gallbladder wall thickening, or biliary dilatation. Pancreas: Unremarkable. No pancreatic ductal dilatation or surrounding inflammatory changes. Spleen: Borderline to mildly enlarged. Adrenals/Urinary Tract: Adrenal glands are unremarkable. Kidneys are normal, without renal calculi, focal lesion, or hydronephrosis. Bladder is unremarkable. Stomach/Bowel: Postsurgical changes of the stomach consistent with history of gastric sleeve surgery. No dilated small or large bowel. Negative appendix. Vascular/Lymphatic: No significant vascular findings are present. No enlarged abdominal or pelvic lymph nodes. Reproductive: Uterus and bilateral adnexa are unremarkable. Other: Negative for free air or free fluid. Musculoskeletal: No acute or significant osseous findings. IMPRESSION: 1. Negative for acute intra-abdominal or pelvic abnormality. 2. Postsurgical changes of the stomach consistent with history of gastric sleeve surgery. 3. Borderline splenomegaly. Electronically Signed   By: Donavan Foil M.D.   On: 03/18/2020 22:17    Chart has been reviewed    Assessment/Plan   45 y.o. female with medical history significant of obesity, HLD,  Status post recent laparoscopic sleeve gastrectomy   Admitted for possible hemolytic anemia and Covid infection  Present on Admission: . Symptomatic anemia -most likely secondary to hemolytic anemia given elevated indirect bilirubin, LDH, elevated reticulocyte, Positive DAT Appreciate hematology consult . Patient was transfused 1 unit  . Repeat hemolysis labs in  a.m. Order blood smear saved Initial work-up showed no evidence of schistocytes   . Elevated bilirubin -suspected secondary to hemolysis   Covid infection -no evidence of hypoxia or pulmonary infiltrates. Ordered remdesivir Hold off on steroids for now but may need in the future. Obtain inflammatory markers   Tachycardia most likely multifactorial secondary to dehydration and symptomatic anemia. No pleuritic chest pain Is significantly elevated D-dimer in the setting of Covid and disproportional to inflammatory markers may consider further imaging  Dehydration gently rehydrate in the setting of Covid infection   Morbid obesity status post recent laparoscopic sleeve gastrectomy t General surgery is aware will continue to follow  There has been case studies of hemolytic anemia in the setting of Covid infection  Other plan as per orders.  DVT prophylaxis:  SCD        Code Status:    Code Status: Prior FULL CODE  as per patient   I had personally discussed CODE STATUS with patient     Family Communication:   Family not at  Bedside     Disposition Plan:        To home once workup is complete and patient is stable   Following barriers for discharge:                            Electrolytes corrected                               Anemia corrected  Will need consultants to evaluate patient prior to discharge                    Consults called: General surgery and hematology oncology  Admission status:  ED Disposition    ED Disposition Condition [place]: [place] [[id number]]  Level of Care: Telemetry [5]  Admit to tele based on following criteria: Other see comments  Comments: covid  May admit patient to [facility] or [facility] if equivalent level of care is available:: No  Covid Evaluation: Confirmed COVID Positive  Diagnosis: Symptomatic anemia [[id number]]  Admitting Physician:  Toy Baker [id number]]  Attending Physician: Toy Baker [[id number]]  Estimated length of stay: past midnight tomorrow  Certification:: I certify this patient will need inpatient services for at least 2 midnights          inpatient     I Expect 2 midnight stay secondary to severity of patient's current illness need for inpatient interventions justified by the following:  hemodynamic instability despite optimal treatment (tachycardia )   Severe lab/radiological/exam abnormalities including:   Symptomatic anemia Covid infection and extensive comorbidities including:  Morbid Obesity .    That are currently affecting medical management.   I expect  patient to be hospitalized for 2 midnights requiring inpatient medical care.  Patient is at high risk for adverse outcome (such as loss of life or disability) if not treated.  Indication for inpatient stay as follows:      inability to maintain oral hydration    Need for IV antivirals ,     Level of care    Tele  indefinitely please discontinue once patient no longer qualifies COVID-19 Labs    Lab Results  Component Value Date   [place] 03/01/2020     Precautions: admitted as  covid positive Airborne and Contact precautions    PPE: Used by the provider:   P100  eye Goggles,  Gloves  gown    Synda Bagent 03/19/2020, 3:26 AM    [facility]     after 2 AM please page floor coverage PA If 7AM-7PM, please contact the day team taking care of the patient using [email or web address]   Patient was evaluated in the context of the global COVID-19 pandemic, which necessitated consideration that the patient might be at risk for infection with the SARS-CoV-2 virus that causes COVID-19. Institutional protocols and algorithms that pertain to the evaluation of patients at risk for COVID-19 are in a state of rapid change based on information released by regulatory bodies including the CDC and federal and state  organizations. These policies and algorithms were followed during the patient's care.

## 2020-03-18 NOTE — ED Provider Notes (Signed)
[facility] DEPT Provider Note   CSN: [id number] Arrival date & time: 03/18/20  1545     History Chief Complaint  Patient presents with  . Covid Positive  . Tachycardia  . Weakness    Cindy Gilbert is a 45 y.o. female.  The history is provided by the patient and medical records.  Weakness  Cindy Gilbert is a 45 y.o. female who presents to the Emergency Department complaining of dehydration. She had a gastric sleeve procedure performed on January 11. She was doing well postoperatively and then a week ago Tuesday she began developing fevers to 101 with associated body aches, mild headache, nausea. Her fevers have decreased since that time but she states that she feels dehydrated. She does have associated dysuria. She took a COVID test and it was positive on Tuesday.  No cough, chest pain, abdominal pain, vomiting, diarrhea, constipation, leg pain.  Has mild sob with exertion.  Has fatigue with exertion.  Has been vaccinated for covid 19. She has not had any contact with people since her hospital discharge.  Last BM 1-2 days ago, not black or bloody.  LMP July 31, perimenopausal.      Past Medical History:  Diagnosis Date  . Allergy    Seasonal  . Anxiety 03/22/2018  . Controlled substance agreement signed 12/18/2016   For xanax  . COVID-19   . Depression    previously on lexapro, celexa,   . Dyslipidemia   . Heart murmur 12/27/2019  . Hip pain, chronic    Right  . Insomnia   . Morbid obesity ([place]) 10/15/2019  . Multinodular goiter (nontoxic) 03/16/2019   Small nodules- no need for bx or follow up imaging 2020  . Ovarian cyst    left  . Right knee pain   . Vitamin D deficiency     Patient Active Problem List   Diagnosis Date Noted  . Symptomatic anemia 03/18/2020  . Prediabetes 03/05/2020  . Chronic right hip pain 03/05/2020  . S/P laparoscopic sleeve gastrectomy 03/05/2020  . Shortness of breath 12/06/2019  . Pre-op  evaluation 12/06/2019  . Palpitations 12/06/2019  . Mixed hyperlipidemia 12/06/2019  . Ovarian cyst   . Depression   . Allergy   . Severe obesity (BMI >= 40) ([place]) 10/15/2019  . Multinodular goiter (nontoxic) 03/16/2019  . Anxiety 03/22/2018  . Vitamin D deficiency 03/11/2018  . Controlled substance agreement signed 12/18/2016  . Insomnia 12/18/2016    Past Surgical History:  Procedure Laterality Date  . ADENOIDECTOMY    . CESAREAN SECTION  2001  . DILATION AND CURETTAGE OF UTERUS    . LAPAROSCOPIC GASTRIC SLEEVE RESECTION N/A 03/05/2020   Procedure: LAPAROSCOPIC GASTRIC SLEEVE RESECTION;  Surgeon: [name], MD;  Location: [facility];  Service: General;  Laterality: N/A;  . TONSILLECTOMY    . UPPER GI ENDOSCOPY N/A 03/05/2020   Procedure: UPPER GI ENDOSCOPY;  Surgeon: [name], MD;  Location: [facility];  Service: General;  Laterality: N/A;     OB History    Gravida  4   Para  1   Term  1   Preterm      AB  2   Living  1     SAB  1   IAB  1   Ectopic      Multiple      Live Births  1           Family History  Problem Relation Age of Onset  .  Heart disease Father   . Stroke Father   . Heart attack Father   . Hypertension Father   . Atrial fibrillation Maternal Aunt   . Cancer Paternal Aunt        Breast  . Heart murmur Maternal Grandmother   . Asthma Paternal Grandmother   . Hypertension Mother   . Hyperlipidemia Mother     Social History   Tobacco Use  . Smoking status: Former Smoker    Packs/day: 0.25    Years: 10.00    Pack years: 2.50    Types: Cigarettes    Quit date: 02/23/2014    Years since quitting: 6.0  . Smokeless tobacco: Never Used  Vaping Use  . Vaping Use: Never used  Substance Use Topics  . Alcohol use: Yes    Alcohol/week: 5.0 standard drinks    Types: 5 Glasses of wine per week  . Drug use: No    Home Medications Prior to Admission medications   Medication Sig Start Date End Date Taking? Authorizing Provider   ALPRAZolam Duanne Moron) 0.5 MG tablet Take 0.5-2 tablets (0.25-1 mg total) by mouth 2 (two) times daily as needed for anxiety. 07/28/19  Yes Johnson, Megan P, DO  Multiple Vitamins-Minerals (MULTIVITAMIN WITH MINERALS) tablet Take 1 tablet by mouth daily.   Yes [provider]  pantoprazole (PROTONIX) 40 MG tablet Take 1 tablet (40 mg total) by mouth daily. 03/07/20  Yes [name], MD  Vitamin D, Ergocalciferol, (DRISDOL) 1.25 MG (50000 UNIT) CAPS capsule TAKE 1 CAPSULE (50,000 UNITS TOTAL) BY MOUTH EVERY 7 (SEVEN) DAYS. 01/25/20  Yes Johnson, Megan P, DO  fluticasone (FLONASE) 50 MCG/ACT nasal spray Place 2 sprays into both nostrils daily. Patient taking differently: Place 2 sprays into both nostrils daily as needed for allergies. 06/10/18   Johnson, Megan P, DO  ondansetron (ZOFRAN-ODT) 4 MG disintegrating tablet Take 1 tablet (4 mg total) by mouth every 6 (six) hours as needed for nausea or vomiting. 03/07/20   [name], MD    Allergies    Ambien [zolpidem tartrate], Belviq [lorcaserin], Bupropion, and Cymbalta [duloxetine hcl]  Review of Systems   Review of Systems  Neurological: Positive for weakness.  All other systems reviewed and are negative.   Physical Exam Updated Vital Signs BP 116/75   Pulse (!) 110   Temp 99.8 F (37.7 C)   Resp 20   Ht 5' 3"  (1.6 m)   Wt 105.2 kg   SpO2 100%   BMI 41.10 kg/m   Physical Exam Vitals and nursing note reviewed.  Constitutional:      Appearance: She is well-developed and well-nourished.  HENT:     Head: Normocephalic and atraumatic.  Eyes:     General: Scleral icterus present.  Cardiovascular:     Rate and Rhythm: Regular rhythm. Tachycardia present.     Heart sounds: No murmur heard.   Pulmonary:     Effort: Pulmonary effort is normal. No respiratory distress.     Breath sounds: Normal breath sounds.  Abdominal:     Palpations: Abdomen is soft.     Tenderness: There is no abdominal tenderness. There is no guarding or  rebound.     Comments: Surgical sites c/d/i  Musculoskeletal:        General: No swelling, tenderness or edema.  Skin:    General: Skin is warm and dry.  Neurological:     Mental Status: She is alert and oriented to person, place, and time.  Psychiatric:  Mood and Affect: Mood and affect normal.        Behavior: Behavior normal.     ED Results / Procedures / Treatments   Labs (all labs ordered are listed, but only abnormal results are displayed) Labs Reviewed  COMPREHENSIVE METABOLIC PANEL - Abnormal; Notable for the following components:      Result Value   Sodium 133 (*)    Glucose, Bld 112 (*)    Total Bilirubin 4.3 (*)    All other components within normal limits  CBC WITH DIFFERENTIAL/PLATELET - Abnormal; Notable for the following components:   WBC 13.3 (*)    RBC 2.09 (*)    Hemoglobin 6.1 (*)    HCT 19.0 (*)    nRBC 1.1 (*)    Neutro Abs 8.9 (*)    Abs Immature Granulocytes 0.70 (*)    All other components within normal limits  URINALYSIS, ROUTINE W REFLEX MICROSCOPIC - Abnormal; Notable for the following components:   APPearance HAZY (*)    Ketones, ur 5 (*)    Leukocytes,Ua LARGE (*)    Bacteria, UA RARE (*)    All other components within normal limits  IRON AND TIBC - Abnormal; Notable for the following components:   Saturation Ratios 64 (*)    All other components within normal limits  FERRITIN - Abnormal; Notable for the following components:   Ferritin 1,433 (*)    All other components within normal limits  RETICULOCYTES - Abnormal; Notable for the following components:   Retic Ct Pct 5.6 (*)    RBC. 2.11 (*)    Immature Retic Fract 45.0 (*)    All other components within normal limits  BILIRUBIN, DIRECT - Abnormal; Notable for the following components:   Bilirubin, Direct 0.7 (*)    All other components within normal limits  LACTATE DEHYDROGENASE - Abnormal; Notable for the following components:   LDH 774 (*)    All other components within  normal limits  I-STAT BETA HCG BLOOD, ED ([facility], [facility], [facility] ONLY) - Abnormal; Notable for the following components:   I-stat hCG, quantitative 6.7 (*)    All other components within normal limits  URINE CULTURE  SARS CORONAVIRUS 2 (TAT 6-24 HRS)  MAGNESIUM  PROTIME-INR  VITAMIN B12  FOLATE  APTT  C3 COMPLEMENT  C4 COMPLEMENT  HAPTOGLOBIN  POC OCCULT BLOOD, ED  POC OCCULT BLOOD, ED  TYPE AND SCREEN  PREPARE RBC (CROSSMATCH)  DIRECT ANTIGLOBULIN TEST (NOT AT [facility])    EKG EKG Interpretation  Date/Time:  Monday March 18 2020 16:00:30 EST Ventricular Rate:  108 PR Interval:    QRS Duration: 81 QT Interval:  321 QTC Calculation: 431 R Axis:   52 Text Interpretation: Sinus tachycardia No previous tracing Confirmed by Quintella Reichert [phone]) on 03/18/2020 4:46:21 PM   Radiology CT Abdomen Pelvis Wo Contrast  Result Date: 03/18/2020 CLINICAL DATA:  Postop gastric sleeve fever EXAM: CT ABDOMEN AND PELVIS WITHOUT CONTRAST TECHNIQUE: Multidetector CT imaging of the abdomen and pelvis was performed following the standard protocol without IV contrast. COMPARISON:  None. FINDINGS: Lower chest: Lung bases demonstrate no acute consolidation or effusion. Differential cardiac blood pool suggesting anemia. Hepatobiliary: No focal liver abnormality is seen. No gallstones, gallbladder wall thickening, or biliary dilatation. Pancreas: Unremarkable. No pancreatic ductal dilatation or surrounding inflammatory changes. Spleen: Borderline to mildly enlarged. Adrenals/Urinary Tract: Adrenal glands are unremarkable. Kidneys are normal, without renal calculi, focal lesion, or hydronephrosis. Bladder is unremarkable. Stomach/Bowel: Postsurgical changes of the stomach consistent  with history of gastric sleeve surgery. No dilated small or large bowel. Negative appendix. Vascular/Lymphatic: No significant vascular findings are present. No enlarged abdominal or pelvic lymph nodes. Reproductive: Uterus and bilateral  adnexa are unremarkable. Other: Negative for free air or free fluid. Musculoskeletal: No acute or significant osseous findings. IMPRESSION: 1. Negative for acute intra-abdominal or pelvic abnormality. 2. Postsurgical changes of the stomach consistent with history of gastric sleeve surgery. 3. Borderline splenomegaly. Electronically Signed   By: Donavan Foil M.D.   On: 03/18/2020 22:17   DG CHEST PORT 1 VIEW  Result Date: 03/18/2020 CLINICAL DATA:  Recent bariatric surgery, fever, tachycardia, weakness, COVID-19 positive EXAM: PORTABLE CHEST 1 VIEW COMPARISON:  03/18/2020, 12/07/2019 FINDINGS: Single frontal view of the chest demonstrates an unremarkable cardiac silhouette. No airspace disease, effusion, or pneumothorax. No acute bony abnormalities. IMPRESSION: 1. No acute intrathoracic process. Electronically Signed   By: Randa Ngo M.D.   On: 03/18/2020 23:34    Procedures Procedures  CRITICAL CARE Performed by: Quintella Reichert   Total critical care time: 45 minutes  Critical care time was exclusive of separately billable procedures and treating other patients.  Critical care was necessary to treat or prevent imminent or life-threatening deterioration.  Critical care was time spent personally by me on the following activities: development of treatment plan with patient and/or surrogate as well as nursing, discussions with consultants, evaluation of patient's response to treatment, examination of patient, obtaining history from patient or surrogate, ordering and performing treatments and interventions, ordering and review of laboratory studies, ordering and review of radiographic studies, pulse oximetry and re-evaluation of patient's condition.  Medications Ordered in ED Medications  0.9 %  sodium chloride infusion (Manually program via Guardrails IV Fluids) (0 mLs Intravenous Hold 03/18/20 1936)  remdesivir 200 mg in sodium chloride 0.9% 250 mL IVPB (200 mg Intravenous New Bag/Given  03/18/20 2340)    Followed by  remdesivir 100 mg in sodium chloride 0.9 % 100 mL IVPB (has no administration in time range)  sodium chloride 0.9 % bolus 1,000 mL (0 mLs Intravenous Stopped 03/18/20 2003)  sodium chloride 0.9 % 1,000 mL with thiamine 154 mg, folic acid 1 mg, multivitamins adult 10 mL infusion ( Intravenous Paused 03/18/20 2331)  pantoprazole (PROTONIX) injection 40 mg (40 mg Intravenous Given 03/18/20 1751)    ED Course  I have reviewed the triage vital signs and the nursing notes.  Pertinent labs & imaging results that were available during my care of the patient were reviewed by me and considered in my medical decision making (see chart for details).    MDM Rules/Calculators/A&P                         patient here for evaluation of shortness of breath status post recent gastric sleeve as well as COVID-19 diagnosis. She is tachycardic on evaluation with scleral icterus. Chest x-ray with no acute infiltrates. CBC is significant for profound anemia compared to priors. No historical reports of bleeding, no evidence of bleeding on examination. Discussed with Dr. Kieth Brightly, with general surgery - who recommends CT abdomen pelvis without contrast to rule out postoperative hematoma that may be contributing to her symptoms. CT scan is negative for acute intra-abdominal process. Bilirubin is elevated at 4.3 concerning for hemolytic process. Discussed with Dr. Chryl Heck with hematology, who recommends C3, C4 and Coom[place] test. It is okay to transfuse and repeat labs post transfusion. Hospitalist consulted for admission for ongoing treatment. Patient updated  findings of studies and recommendation for mission and she is in agreement with plan.  Final Clinical Impression(s) / ED Diagnoses Final diagnoses:  Acquired hemolytic anemia ([place])  COVID-19 virus infection    Rx / DC Orders ED Discharge Orders    None       Quintella Reichert, MD 03/19/20 0021

## 2020-03-18 NOTE — Progress Notes (Signed)
Cindy Gilbert Appointment: 03/18/2020 2:00 PM Location: [facility] Patient #: [id number] DOB: 1975-08-20 Single / Language: Cindy Gilbert / Race: Black or African American Female   History of Present Illness  The patient is a 45 year old female presenting status-post bariatric surgery. She is status post laparoscopic sleeve gastrectomy on 03/05/20 by Dr. [name]. Discharged postop day 1. She is planning to urgent office today, believing that she is dehydrated. Last week, she called the office complaining of a fever between 99 and 101F. She took a covid test at home and was positive on 03/12/20. Last reported fever was 4 days ago. Denies URI symptoms. She states she did not go anywhere since surgery and has not had any interactions until today.   She currently complains of lightheadedness, confusion, muscle aches, and dark urine. She states she got slightly confused when trying to use elevator to come to the office today and forgot where she was going. She was able to drink 40 ounces of water today so far, and drank 32 ounces yesterday. She denies nausea, vomiting, and abdominal pain. When asked if she felt like her eyes were slightly yellow, she stated they are "darker than usual."  Allergies  Wellbutrin *ANTIDEPRESSANTS*  Ambien *HYPNOTICS/SEDATIVES/SLEEP DISORDER AGENTS*  Allergies Reconciled   Medication History Vitamin D (Ergocalciferol) (1.25 MG(50000 UT) Capsule, Oral) Active. Protonix (40MG Tablet DR, Oral) Active. Medications Reconciled  Review of Systems All other systems negative  Vitals 03/18/2020 2:12 PM Weight: 234.5 lb Height: 62in Body Surface Area: 2.05 m Body Mass Index: 42.89 kg/m  Temp.: 95.67F  Pulse: 153 (Regular)  BP: 126/82(Sitting, Left Arm, Standard)  Physical Exam  General: Alert, oriented, looks weak Eye: Possible scleral icterus Chest and Lung Exam: Effort normal Abdomen: Soft, non-tender, non-distended. Incisions  dry and intact. No surrounding erythema or drainage. Reports mild back pain when pressing on left upper quadrant  Assessment & Plan  S/P LAPAROSCOPIC SLEEVE GASTRECTOMY (Z98.84) She is presenting to the office, almost 2 weeks out from sleeve gastrectomy (13 pound weight loss), and tested positive for Covid 1 week ago. She is tachycardic on exam, with possible scleral icterus. She had recent symptoms of confusion and lightheadedness. We believe she would benefit from IV fluids and labs, but unfortunately we cannot arrange for these today as there are no openings. Therefore, Dr. [name] recommended she go to the emergency room. He spoke to the charge nurse so they will be expecting her at [facility]. She agrees with this plan.   Signed by Kellie Shropshire, PA C (03/18/2020 5:24 PM)

## 2020-03-19 ENCOUNTER — Ambulatory Visit: Payer: 59 | Admitting: Skilled Nursing Facility1

## 2020-03-19 DIAGNOSIS — R519 Headache, unspecified: Secondary | ICD-10-CM

## 2020-03-19 DIAGNOSIS — Z7901 Long term (current) use of anticoagulants: Secondary | ICD-10-CM

## 2020-03-19 DIAGNOSIS — U071 COVID-19: Secondary | ICD-10-CM | POA: Diagnosis present

## 2020-03-19 DIAGNOSIS — F320 Major depressive disorder, single episode, mild: Secondary | ICD-10-CM

## 2020-03-19 DIAGNOSIS — Z87891 Personal history of nicotine dependence: Secondary | ICD-10-CM

## 2020-03-19 DIAGNOSIS — D5919 Other autoimmune hemolytic anemia: Secondary | ICD-10-CM

## 2020-03-19 DIAGNOSIS — R17 Unspecified jaundice: Secondary | ICD-10-CM | POA: Diagnosis present

## 2020-03-19 DIAGNOSIS — R5382 Chronic fatigue, unspecified: Secondary | ICD-10-CM

## 2020-03-19 DIAGNOSIS — Z79899 Other long term (current) drug therapy: Secondary | ICD-10-CM

## 2020-03-19 DIAGNOSIS — R7303 Prediabetes: Secondary | ICD-10-CM

## 2020-03-19 DIAGNOSIS — R7402 Elevation of levels of lactic acid dehydrogenase (LDH): Secondary | ICD-10-CM

## 2020-03-19 DIAGNOSIS — F419 Anxiety disorder, unspecified: Secondary | ICD-10-CM

## 2020-03-19 LAB — CBC WITH DIFFERENTIAL/PLATELET
Abs Immature Granulocytes: 0.63 10*3/uL — ABNORMAL HIGH (ref 0.00–0.07)
Abs Immature Granulocytes: 0 10*3/uL (ref 0.00–0.07)
Basophils Absolute: 0 10*3/uL (ref 0.0–0.1)
Basophils Absolute: 0 10*3/uL (ref 0.0–0.1)
Basophils Relative: 0 %
Basophils Relative: 0 %
Eosinophils Absolute: 0.1 10*3/uL (ref 0.0–0.5)
Eosinophils Absolute: 0.1 10*3/uL (ref 0.0–0.5)
Eosinophils Relative: 1 %
Eosinophils Relative: 1 %
HCT: 18.3 % — ABNORMAL LOW (ref 36.0–46.0)
HCT: 27.8 % — ABNORMAL LOW (ref 36.0–46.0)
Hemoglobin: 6.1 g/dL — CL (ref 12.0–15.0)
Hemoglobin: 9.3 g/dL — ABNORMAL LOW (ref 12.0–15.0)
Immature Granulocytes: 6 %
Lymphocytes Relative: 33 %
Lymphocytes Relative: 29 %
Lymphs Abs: 3.4 10*3/uL (ref 0.7–4.0)
Lymphs Abs: 3.6 10*3/uL (ref 0.7–4.0)
MCH: 29.9 pg (ref 26.0–34.0)
MCH: 30.3 pg (ref 26.0–34.0)
MCHC: 33.3 g/dL (ref 30.0–36.0)
MCHC: 33.5 g/dL (ref 30.0–36.0)
MCV: 89.7 fL (ref 80.0–100.0)
MCV: 90.6 fL (ref 80.0–100.0)
Monocytes Absolute: 0.4 10*3/uL (ref 0.1–1.0)
Monocytes Absolute: 0.1 10*3/uL (ref 0.1–1.0)
Monocytes Relative: 4 %
Monocytes Relative: 1 %
Neutro Abs: 5.8 10*3/uL (ref 1.7–7.7)
Neutro Abs: 8.6 10*3/uL — ABNORMAL HIGH (ref 1.7–7.7)
Neutrophils Relative %: 56 %
Neutrophils Relative %: 69 %
Platelets: 259 10*3/uL (ref 150–400)
Platelets: 287 10*3/uL (ref 150–400)
RBC: 2.04 MIL/uL — ABNORMAL LOW (ref 3.87–5.11)
RBC: 3.07 MIL/uL — ABNORMAL LOW (ref 3.87–5.11)
RDW: 15.5 % (ref 11.5–15.5)
RDW: 14.9 % (ref 11.5–15.5)
WBC: 10.3 10*3/uL (ref 4.0–10.5)
WBC: 12.4 10*3/uL — ABNORMAL HIGH (ref 4.0–10.5)
nRBC: 1 % — ABNORMAL HIGH (ref 0.0–0.2)
nRBC: 1.4 % — ABNORMAL HIGH (ref 0.0–0.2)

## 2020-03-19 LAB — COMPREHENSIVE METABOLIC PANEL
ALT: 19 U/L (ref 0–44)
ALT: 23 U/L (ref 0–44)
AST: 30 U/L (ref 15–41)
AST: 36 U/L (ref 15–41)
Albumin: 3.4 g/dL — ABNORMAL LOW (ref 3.5–5.0)
Albumin: 4 g/dL (ref 3.5–5.0)
Alkaline Phosphatase: 47 U/L (ref 38–126)
Alkaline Phosphatase: 63 U/L (ref 38–126)
Anion gap: 9 (ref 5–15)
Anion gap: 11 (ref 5–15)
BUN: 13 mg/dL (ref 6–20)
BUN: 14 mg/dL (ref 6–20)
CO2: 23 mmol/L (ref 22–32)
CO2: 23 mmol/L (ref 22–32)
Calcium: 8.4 mg/dL — ABNORMAL LOW (ref 8.9–10.3)
Calcium: 9.2 mg/dL (ref 8.9–10.3)
Chloride: 105 mmol/L (ref 98–111)
Chloride: 104 mmol/L (ref 98–111)
Creatinine, Ser: 0.66 mg/dL (ref 0.44–1.00)
Creatinine, Ser: 0.67 mg/dL (ref 0.44–1.00)
GFR, Estimated: >60 mL/min (ref >60)
GFR, Estimated: >60 mL/min (ref >60)
Glucose, Bld: 99 mg/dL (ref 70–99)
Glucose, Bld: 124 mg/dL — ABNORMAL HIGH (ref 70–99)
Potassium: 3.8 mmol/L (ref 3.5–5.1)
Potassium: 3.7 mmol/L (ref 3.5–5.1)
Sodium: 137 mmol/L (ref 135–145)
Sodium: 138 mmol/L (ref 135–145)
Total Bilirubin: 3.5 mg/dL — ABNORMAL HIGH (ref 0.3–1.2)
Total Bilirubin: 2.8 mg/dL — ABNORMAL HIGH (ref 0.3–1.2)
Total Protein: 6.3 g/dL — ABNORMAL LOW (ref 6.5–8.1)
Total Protein: 7.5 g/dL (ref 6.5–8.1)

## 2020-03-19 LAB — RETICULOCYTES
Immature Retic Fract: 40.8 % — ABNORMAL HIGH (ref 2.3–15.9)
Immature Retic Fract: 39.4 % — ABNORMAL HIGH (ref 2.3–15.9)
RBC.: 2.05 MIL/uL — ABNORMAL LOW (ref 3.87–5.11)
RBC.: 3.03 MIL/uL — ABNORMAL LOW (ref 3.87–5.11)
Retic Count, Absolute: 121.8 10*3/uL (ref 19.0–186.0)
Retic Count, Absolute: 149.4 10*3/uL (ref 19.0–186.0)
Retic Ct Pct: 5.9 % — ABNORMAL HIGH (ref 0.4–3.1)
Retic Ct Pct: 4.9 % — ABNORMAL HIGH (ref 0.4–3.1)

## 2020-03-19 LAB — TROPONIN I (HIGH SENSITIVITY)
Troponin I (High Sensitivity): 24 ng/L — ABNORMAL HIGH (ref <18)
Troponin I (High Sensitivity): 24 ng/L — ABNORMAL HIGH (ref <18)

## 2020-03-19 LAB — LIPID PANEL
Cholesterol: 170 mg/dL (ref 0–200)
HDL: 25 mg/dL — ABNORMAL LOW (ref >40)
LDL Cholesterol: 129 mg/dL — ABNORMAL HIGH (ref 0–99)
Total CHOL/HDL Ratio: 6.8 RATIO
Triglycerides: 78 mg/dL (ref <150)
VLDL: 16 mg/dL (ref 0–40)

## 2020-03-19 LAB — ABO/RH: ABO/RH(D): A POS

## 2020-03-19 LAB — MAGNESIUM: Magnesium: 2.2 mg/dL (ref 1.7–2.4)

## 2020-03-19 LAB — D-DIMER, QUANTITATIVE: D-Dimer, Quant: 4.57 ug/mL-FEU — ABNORMAL HIGH (ref 0.00–0.50)

## 2020-03-19 LAB — DIRECT ANTIGLOBULIN TEST (NOT AT ARMC)
DAT, IgG: POSITIVE
DAT, complement: POSITIVE

## 2020-03-19 LAB — SAVE SMEAR(SSMR), FOR PROVIDER SLIDE REVIEW

## 2020-03-19 LAB — TSH: TSH: 1.338 u[IU]/mL (ref 0.350–4.500)

## 2020-03-19 LAB — HEMOGLOBIN AND HEMATOCRIT, BLOOD
HCT: 19.1 % — ABNORMAL LOW (ref 36.0–46.0)
Hemoglobin: 6.3 g/dL — CL (ref 12.0–15.0)

## 2020-03-19 LAB — LACTATE DEHYDROGENASE: LDH: 957 U/L — ABNORMAL HIGH (ref 98–192)

## 2020-03-19 LAB — FERRITIN: Ferritin: 1166 ng/mL — ABNORMAL HIGH (ref 11–307)

## 2020-03-19 LAB — BRAIN NATRIURETIC PEPTIDE: B Natriuretic Peptide: 28.3 pg/mL (ref 0.0–100.0)

## 2020-03-19 LAB — PREPARE RBC (CROSSMATCH)

## 2020-03-19 LAB — PHOSPHORUS: Phosphorus: 2.8 mg/dL (ref 2.5–4.6)

## 2020-03-19 LAB — SARS CORONAVIRUS 2 (TAT 6-24 HRS): SARS Coronavirus 2: POSITIVE — AB

## 2020-03-19 LAB — HCG, QUANTITATIVE, PREGNANCY: hCG, Beta Chain, Quant, S: 8 m[IU]/mL — ABNORMAL HIGH (ref <5)

## 2020-03-19 LAB — C-REACTIVE PROTEIN: CRP: 4.9 mg/dL — ABNORMAL HIGH (ref <1.0)

## 2020-03-19 LAB — PROCALCITONIN: Procalcitonin: 0.31 ng/mL

## 2020-03-19 LAB — HEPATITIS B SURFACE ANTIGEN: Hepatitis B Surface Ag: NONREACTIVE

## 2020-03-19 MED ORDER — ACETAMINOPHEN 325 MG PO TABS: 650.0000 mg | ORAL_TABLET | Freq: Four times a day (QID) | ORAL | Status: DC | PRN

## 2020-03-19 MED ORDER — DIPHENHYDRAMINE HCL 50 MG/ML IJ SOLN
INTRAMUSCULAR | Status: AC
  Administered 2020-03-19: 12.5 mg via INTRAVENOUS
  Filled 2020-03-19: qty 1

## 2020-03-19 MED ORDER — HYDROCODONE-ACETAMINOPHEN 5-325 MG PO TABS: 1.0000 | ORAL_TABLET | ORAL | Status: DC | PRN

## 2020-03-19 MED ORDER — SODIUM CHLORIDE 0.9 % IV SOLN: 100.0000 mg | Freq: Every day | INTRAVENOUS | Status: DC

## 2020-03-19 MED ORDER — SODIUM CHLORIDE 0.9% IV SOLUTION: Freq: Once | INTRAVENOUS | Status: AC

## 2020-03-19 MED ORDER — ONDANSETRON HCL 4 MG/2ML IJ SOLN
4.0000 mg | Freq: Four times a day (QID) | INTRAMUSCULAR | Status: DC | PRN
  Administered 2020-03-20: 4 mg via INTRAVENOUS
  Filled 2020-03-19 (×2): qty 2

## 2020-03-19 MED ORDER — TRAMADOL HCL 50 MG PO TABS: 50.0000 mg | ORAL_TABLET | Freq: Four times a day (QID) | ORAL | Status: DC | PRN

## 2020-03-19 MED ORDER — PANTOPRAZOLE SODIUM 40 MG PO TBEC
40.0000 mg | DELAYED_RELEASE_TABLET | Freq: Every day | ORAL | Status: DC
  Administered 2020-03-19 – 2020-03-23 (×5): 40 mg via ORAL
  Filled 2020-03-19 (×5): qty 1

## 2020-03-19 MED ORDER — PREDNISONE 20 MG PO TABS
60.0000 mg | ORAL_TABLET | Freq: Every day | ORAL | Status: DC
  Administered 2020-03-20: 60 mg via ORAL
  Filled 2020-03-19: qty 3

## 2020-03-19 MED ORDER — SODIUM CHLORIDE 0.9% FLUSH
3.0000 mL | Freq: Two times a day (BID) | INTRAVENOUS | Status: DC
  Administered 2020-03-19 – 2020-03-23 (×8): 3 mL via INTRAVENOUS

## 2020-03-19 MED ORDER — SODIUM CHLORIDE 0.9 % IV SOLN: 250.0000 mL | INTRAVENOUS | Status: DC | PRN

## 2020-03-19 MED ORDER — SODIUM CHLORIDE 0.9 % IV SOLN: INTRAVENOUS | Status: AC

## 2020-03-19 MED ORDER — SODIUM CHLORIDE 0.9 % IV SOLN: 200.0000 mg | Freq: Once | INTRAVENOUS | Status: DC

## 2020-03-19 MED ORDER — METOPROLOL TARTRATE 12.5 MG HALF TABLET
12.5000 mg | ORAL_TABLET | Freq: Two times a day (BID) | ORAL | Status: DC
  Administered 2020-03-23: 12.5 mg via ORAL
  Filled 2020-03-19 (×2): qty 1

## 2020-03-19 MED ORDER — GUAIFENESIN-DM 100-10 MG/5ML PO SYRP: 10.0000 mL | ORAL_SOLUTION | ORAL | Status: DC | PRN

## 2020-03-19 MED ORDER — MELATONIN 5 MG PO TABS
5.0000 mg | ORAL_TABLET | Freq: Every evening | ORAL | Status: DC | PRN
  Administered 2020-03-19 – 2020-03-22 (×4): 5 mg via ORAL
  Filled 2020-03-19 (×4): qty 1

## 2020-03-19 MED ORDER — ENSURE MAX PROTEIN PO LIQD
11.0000 [oz_av] | Freq: Two times a day (BID) | ORAL | Status: DC
  Administered 2020-03-19 – 2020-03-20 (×3): 11 [oz_av] via ORAL
  Filled 2020-03-19: qty 330

## 2020-03-19 MED ORDER — FOLIC ACID 1 MG PO TABS
1.0000 mg | ORAL_TABLET | Freq: Every day | ORAL | Status: DC
  Administered 2020-03-19 – 2020-03-23 (×5): 1 mg via ORAL
  Filled 2020-03-19 (×5): qty 1

## 2020-03-19 MED ORDER — SODIUM CHLORIDE 0.9% FLUSH
3.0000 mL | INTRAVENOUS | Status: DC | PRN
Start: 2020-03-19 — End: 2020-03-23

## 2020-03-19 MED ORDER — ONDANSETRON HCL 4 MG PO TABS
4.0000 mg | ORAL_TABLET | Freq: Four times a day (QID) | ORAL | Status: DC | PRN
  Administered 2020-03-23: 4 mg via ORAL
  Filled 2020-03-19: qty 1

## 2020-03-19 MED ORDER — DIPHENHYDRAMINE HCL 50 MG/ML IJ SOLN
12.5000 mg | INTRAMUSCULAR | Status: DC | PRN
  Administered 2020-03-21: 12.5 mg via INTRAVENOUS
  Filled 2020-03-19 (×2): qty 1

## 2020-03-19 MED ORDER — ENOXAPARIN SODIUM 40 MG/0.4ML ~~LOC~~ SOLN
40.0000 mg | Freq: Every day | SUBCUTANEOUS | Status: DC
  Administered 2020-03-19 – 2020-03-20 (×2): 40 mg via SUBCUTANEOUS
  Filled 2020-03-19 (×2): qty 0.4

## 2020-03-19 MED ORDER — PREDNISONE 20 MG PO TABS
100.0000 mg | ORAL_TABLET | Freq: Every day | ORAL | Status: DC
  Administered 2020-03-19: 100 mg via ORAL
  Filled 2020-03-19: qty 2

## 2020-03-19 MED ORDER — SODIUM CHLORIDE 0.9% FLUSH
3.0000 mL | Freq: Two times a day (BID) | INTRAVENOUS | Status: DC
  Administered 2020-03-19 – 2020-03-22 (×4): 3 mL via INTRAVENOUS

## 2020-03-19 NOTE — ED Notes (Signed)
Patient was given her lunch tray.

## 2020-03-19 NOTE — Progress Notes (Signed)
PROGRESS NOTE    Cindy Gilbert  AYT:[id number] DOB: Nov 11, 1975 DOA: 03/18/2020 PCP: Dorcas Carrow, DO     Brief Narrative:  Cindy Gilbert is a 45 y.o. BF PMHx on a controlled substance contract, anxiety, depression, obesity, s/p recent laparoscopic sleeve gastrectomy HLD,  heart murmur, vitamin D deficiency, multinodular goiter (nontoxic) diabetes   Presented with   tachycardia and weakness On January 11 patient undergone laparoscopic sleeve gastrectomy She did well her diet was advanced she was given Lovenox for DVT prophylaxis. She was able to be discharged on 13 January  On 18 January intake patient developed cough and fever up to 101.7 tested positive for Covid Patient states she had not had contact with anybody since her discharge Also endorses some constipation. His symptoms started to improve somewhat but still remain tachycardic and feeling lightheaded and dehydrated. She was seen in clinic by Dr. Andrey Campanile and was sent to emergency department  Denies any black stools no heavy menstrual. She had a hematoma in her thigh about a size of her fist but that has resolved since.   Last fever was 4 days ago   Subjective: Afebrile overnight, A/O x4, slightly sleepy secondary to receiving Benadryl prior to blood transfusion.   Assessment & Plan: Covid vaccination; vaccinated 2/3   Active Problems:   Vitamin D deficiency   Controlled substance agreement signed   Anxiety   Multinodular goiter (nontoxic)   Depression   Prediabetes   S/P laparoscopic sleeve gastrectomy   Symptomatic anemia   Elevated bilirubin   COVID-19 virus infection  Covid infection COVID-19 Labs  Recent Labs    03/18/20 1621 03/18/20 2047 03/19/20 0518  DDIMER  --   --  4.57*  FERRITIN 1,433*  --  1,166*  LDH  --  774*  --   CRP  --   --  4.9*    Lab Results  Component Value Date   SARSCOV2NAA POSITIVE (A) 03/18/2020   SARSCOV2NAA NEGATIVE 03/01/2020   SARSCOV2NAA Not  Detected 09/15/2018  -no evidence of hypoxia or pulmonary infiltrates.  Given patient's high risk factor would require at least 3 days remdesivir -Prednisone 100 mg daily -Remdesivir per pharmacy protocol  Symptomatic anemia/Acute hemolytic anemia  -most likely secondary to hemolytic anemia given elevated indirect bilirubin, LDH, elevated reticulocyte, Positive DAT -Initial work-up showed no schistocytes -Steroids per Hematology -See COVID -Order blood smear saved -1/24 transfuse 1 unit PRBC -1/25 transfuse 2 units PRBC  Elevated bilirubin -Secondary to hemolysis  Sinus tachycardia -Multifactorial to include dehydration, symptomatic anemia, COVID infection. -1/25 Metoprolol 12.5 mg BID hold for MAP<65  Dehydration -Resolved with blood transfusion and hydration overnight.  Morbid obesity -Dr. Gaynelle Adu surgeon who performed laparoscopic sleeve gastrectomy recently aware patient patient has been hospitalized. -He is available for any questions and has been very involved in her care.   DVT prophylaxis: Lovenox Code Status: full Family Communication:  Status is: Inpatient    Dispo: The patient is from: Home              Anticipated d/c is to: Home              Anticipated d/c date is: 1/31              Patient currently unstable      Consultants:  Hematology Dr Rachel Moulds Dr. Gaynelle Adu surgeon    Procedures/Significant Events:  1/24 transfuse 1 unit PRBC 1/25 transfuse 2 units PRBC    I have personally reviewed and  interpreted all radiology studies and my findings are as above.  VENTILATOR SETTINGS:    Cultures   Antimicrobials: Anti-infectives (From admission, onward)   Start     Ordered Stop   03/20/20 1000  remdesivir 100 mg in sodium chloride 0.9 % 100 mL IVPB  Status:  Discontinued       "Followed by" Linked Group Details   03/19/20 0210 03/19/20 0222   03/19/20 1000  remdesivir 100 mg in sodium chloride 0.9 % 100 mL IVPB        "Followed by" Linked Group Details   03/18/20 2324 03/23/20 0959   03/18/20 2330  remdesivir 200 mg in sodium chloride 0.9% 250 mL IVPB  Status:  Discontinued       "Followed by" Linked Group Details   03/19/20 0210 03/19/20 0222   03/18/20 2330  remdesivir 200 mg in sodium chloride 0.9% 250 mL IVPB       "Followed by" Linked Group Details   03/18/20 2324 03/19/20 0040       Devices    LINES / TUBES:      Continuous Infusions:  sodium chloride     sodium chloride 75 mL/hr at 03/19/20 0504   remdesivir 100 mg in NS 100 mL       Objective: Vitals:   03/18/20 2230 03/18/20 2249 03/19/20 0205 03/19/20 0500  BP: 107/72 116/75 119/85 115/76  Pulse: (!) 109 (!) 110 (!) 112 (!) 115  Resp: 17 20 14  (!) 24  Temp: 99.5 F (37.5 C) 99.8 F (37.7 C) 99.4 F (37.4 C)   TempSrc: Oral  Oral   SpO2: 100% 100% 99% 100%  Weight:      Height:        Intake/Output Summary (Last 24 hours) at 03/19/2020 4010 Last data filed at 03/19/2020 0206 Gross per 24 hour  Intake 739 ml  Output --  Net 739 ml   Filed Weights   03/18/20 1607  Weight: 105.2 kg    Examination:  General: A/O x4, No acute respiratory distress Eyes: negative scleral hemorrhage, negative anisocoria, negative icterus ENT: Negative Runny nose, negative gingival bleeding, Neck:  Negative scars, masses, torticollis, lymphadenopathy, JVD Lungs: Clear to auscultation bilaterally without wheezes or crackles Cardiovascular: Regular rate and rhythm without murmur gallop or rub normal S1 and S2 Abdomen: MORBIDLY OBESE, negative abdominal pain, nondistended, positive soft, bowel sounds, no rebound, no ascites, no appreciable mass Extremities: No significant cyanosis, clubbing, or edema bilateral lower extremities Skin: Negative rashes, lesions, ulcers Psychiatric:  Negative depression, negative anxiety, negative fatigue, negative mania  Central nervous system:  Cranial nerves II through XII intact, tongue/uvula  midline, all extremities muscle strength 5/5, sensation intact throughout, negative dysarthria, negative expressive aphasia, negative receptive aphasia.  .     Data Reviewed: Care during the described time interval was provided by me .  I have reviewed this patient's available data, including medical history, events of note, physical examination, and all test results as part of my evaluation.  CBC: Recent Labs  Lab 03/18/20 1621 03/19/20 0518  WBC 13.3* 10.3  NEUTROABS 8.9* 5.8  HGB 6.1* 6.1*  HCT 19.0* 18.3*  MCV 90.9 89.7  PLT 308 259   Basic Metabolic Panel: Recent Labs  Lab 03/18/20 1621 03/19/20 0518  NA 133* 137  K 4.1 3.8  CL 98 105  CO2 23 23  GLUCOSE 112* 99  BUN 16 13  CREATININE 0.78 0.66  CALCIUM 9.4 8.4*  MG 2.4 2.2   GFR:  Estimated Creatinine Clearance: 104.1 mL/min (by C-G formula based on SCr of 0.66 mg/dL). Liver Function Tests: Recent Labs  Lab 03/18/20 1621 03/19/20 0518  AST 38 30  ALT 22 19  ALKPHOS 61 47  BILITOT 4.3* 3.5*  PROT 7.8 6.3*  ALBUMIN 4.4 3.4*   No results for input(s): LIPASE, AMYLASE in the last 168 hours. No results for input(s): AMMONIA in the last 168 hours. Coagulation Profile: Recent Labs  Lab 03/18/20 1621  INR 1.2   Cardiac Enzymes: No results for input(s): CKTOTAL, CKMB, CKMBINDEX, TROPONINI in the last 168 hours. BNP (last 3 results) No results for input(s): PROBNP in the last 8760 hours. HbA1C: No results for input(s): HGBA1C in the last 72 hours. CBG: No results for input(s): GLUCAP in the last 168 hours. Lipid Profile: Recent Labs    03/19/20 0518  CHOL 170  HDL 25*  LDLCALC 129*  TRIG 78  CHOLHDL 6.8   Thyroid Function Tests: Recent Labs    03/19/20 0518  TSH 1.338   Anemia Panel: Recent Labs    03/18/20 1621 03/19/20 0518  VITAMINB12 471  --   FOLATE 16.5  --   FERRITIN 1,433* 1,166*  TIBC 253  --   IRON 161  --   RETICCTPCT 5.6* 5.9*   Sepsis Labs: Recent Labs  Lab  03/19/20 0518  PROCALCITON 0.31    Recent Results (from the past 240 hour(s))  SARS CORONAVIRUS 2 (TAT 6-24 HRS) Nasopharyngeal Nasopharyngeal Swab     Status: Abnormal   Collection Time: 03/18/20  5:22 PM   Specimen: Nasopharyngeal Swab  Result Value Ref Range Status   SARS Coronavirus 2 POSITIVE (A) NEGATIVE Final    Comment: (NOTE) SARS-CoV-2 target nucleic acids are DETECTED.  The SARS-CoV-2 RNA is generally detectable in upper and lower respiratory specimens during the acute phase of infection. Positive results are indicative of the presence of SARS-CoV-2 RNA. Clinical correlation with patient history and other diagnostic information is  necessary to determine patient infection status. Positive results do not rule out bacterial infection or co-infection with other viruses.  The expected result is Negative.  Fact Sheet for Patients: HairSlick.no  Fact Sheet for Healthcare Providers: quierodirigir.com  This test is not yet approved or cleared by the [place] FDA and  has been authorized for detection and/or diagnosis of SARS-CoV-2 by FDA under an Emergency Use Authorization (EUA). This EUA will remain  in effect (meaning this test can be used) for the duration of the COVID-19 declaration under Section 564(b)(1) of the Act, 21 U. S.C. section 360bbb-3(b)(1), unless the authorization is terminated or revoked sooner.   Performed at [facility], [address], [place], [place] [address]          Radiology Studies: CT Abdomen Pelvis Wo Contrast  Result Date: 03/18/2020 CLINICAL DATA:  Postop gastric sleeve fever EXAM: CT ABDOMEN AND PELVIS WITHOUT CONTRAST TECHNIQUE: Multidetector CT imaging of the abdomen and pelvis was performed following the standard protocol without IV contrast. COMPARISON:  None. FINDINGS: Lower chest: Lung bases demonstrate no acute consolidation or effusion. Differential cardiac  blood pool suggesting anemia. Hepatobiliary: No focal liver abnormality is seen. No gallstones, gallbladder wall thickening, or biliary dilatation. Pancreas: Unremarkable. No pancreatic ductal dilatation or surrounding inflammatory changes. Spleen: Borderline to mildly enlarged. Adrenals/Urinary Tract: Adrenal glands are unremarkable. Kidneys are normal, without renal calculi, focal lesion, or hydronephrosis. Bladder is unremarkable. Stomach/Bowel: Postsurgical changes of the stomach consistent with history of gastric sleeve surgery. No  dilated small or large bowel. Negative appendix. Vascular/Lymphatic: No significant vascular findings are present. No enlarged abdominal or pelvic lymph nodes. Reproductive: Uterus and bilateral adnexa are unremarkable. Other: Negative for free air or free fluid. Musculoskeletal: No acute or significant osseous findings. IMPRESSION: 1. Negative for acute intra-abdominal or pelvic abnormality. 2. Postsurgical changes of the stomach consistent with history of gastric sleeve surgery. 3. Borderline splenomegaly. Electronically Signed   By: Jasmine Pang M.D.   On: 03/18/2020 22:17   DG CHEST PORT 1 VIEW  Result Date: 03/18/2020 CLINICAL DATA:  Recent bariatric surgery, fever, tachycardia, weakness, COVID-19 positive EXAM: PORTABLE CHEST 1 VIEW COMPARISON:  03/18/2020, 12/07/2019 FINDINGS: Single frontal view of the chest demonstrates an unremarkable cardiac silhouette. No airspace disease, effusion, or pneumothorax. No acute bony abnormalities. IMPRESSION: 1. No acute intrathoracic process. Electronically Signed   By: Sharlet Salina M.D.   On: 03/18/2020 23:34        Scheduled Meds:  sodium chloride   Intravenous Once   pantoprazole  40 mg Oral Daily   sodium chloride flush  3 mL Intravenous Q12H   sodium chloride flush  3 mL Intravenous Q12H   Continuous Infusions:  sodium chloride     sodium chloride 75 mL/hr at 03/19/20 9629   remdesivir 100 mg in NS 100 mL        LOS: 1 day    Time spent:40 min    Phillis Thackeray, Roselind Messier, MD [facility]   If 7PM-7AM, please contact night-coverage 03/19/2020, 7:51 AM

## 2020-03-19 NOTE — ED Notes (Signed)
Blood bank has 2 units of blood ready for this pt.

## 2020-03-19 NOTE — Progress Notes (Signed)
Refused lopressor 12.71m  Resting heart rate 90-100, 130-140 while ambulating  Patient states she is not comfortable taking medication until she speaks with her doctor

## 2020-03-19 NOTE — Progress Notes (Signed)
Brief progress note Full note to follow. It appears she has coombs positive hemolytic anemia. Will start her on steroids at 63m/kg daily With active COVID 19 infection, rituximab use can be a challenge, hence will try to see if steroids can alone put her in a remission Will order 2 units of blood for today Please consider CBC, CMP, LDH retic count every 6 hrs to monitor the degree of hemolysis Please start her on folic acid 1 mg daily to support RBC production during hemolysis.

## 2020-03-19 NOTE — Progress Notes (Signed)
Subjective/Chief Complaint: No nausea  No abd pain Tolerated some eggs and other liquids No sob Just extreme fatigue when ambulating   Objective: Vital signs in last 24 hours: Temp:  [99.2 F (37.3 C)-99.8 F (37.7 C)] 99.2 F (37.3 C) (01/25 1018) Pulse Rate:  [105-115] 109 (01/25 1100) Resp:  [14-24] 21 (01/25 1100) BP: (103-136)/(68-86) 118/77 (01/25 1100) SpO2:  [99 %-100 %] 100 % (01/25 1100) Weight:  [105.2 kg] 105.2 kg (01/24 1607)    Intake/Output from previous day: 01/24 0701 - 01/25 0700 In: 739 [Blood:739] Out: -  Intake/Output this shift: Total I/O In: 315 [Blood:315] Out: -   Alert, nontoxic symm rise, non labored Mild tachy Soft, NT, incisions ok No edema  Lab Results:  Recent Labs    03/18/20 1621 03/19/20 0518 03/19/20 0822  WBC 13.3* 10.3  --   HGB 6.1* 6.1* 6.3*  HCT 19.0* 18.3* 19.1*  PLT 308 259  --    BMET Recent Labs    03/18/20 1621 03/19/20 0518  NA 133* 137  K 4.1 3.8  CL 98 105  CO2 23 23  GLUCOSE 112* 99  BUN 16 13  CREATININE 0.78 0.66  CALCIUM 9.4 8.4*   PT/INR Recent Labs    03/18/20 1621  LABPROT 14.5  INR 1.2   ABG No results for input(s): PHART, HCO3 in the last 72 hours.  Invalid input(s): PCO2, PO2  Studies/Results: CT Abdomen Pelvis Wo Contrast  Result Date: 03/18/2020 CLINICAL DATA:  Postop gastric sleeve fever EXAM: CT ABDOMEN AND PELVIS WITHOUT CONTRAST TECHNIQUE: Multidetector CT imaging of the abdomen and pelvis was performed following the standard protocol without IV contrast. COMPARISON:  None. FINDINGS: Lower chest: Lung bases demonstrate no acute consolidation or effusion. Differential cardiac blood pool suggesting anemia. Hepatobiliary: No focal liver abnormality is seen. No gallstones, gallbladder wall thickening, or biliary dilatation. Pancreas: Unremarkable. No pancreatic ductal dilatation or surrounding inflammatory changes. Spleen: Borderline to mildly enlarged. Adrenals/Urinary Tract:  Adrenal glands are unremarkable. Kidneys are normal, without renal calculi, focal lesion, or hydronephrosis. Bladder is unremarkable. Stomach/Bowel: Postsurgical changes of the stomach consistent with history of gastric sleeve surgery. No dilated small or large bowel. Negative appendix. Vascular/Lymphatic: No significant vascular findings are present. No enlarged abdominal or pelvic lymph nodes. Reproductive: Uterus and bilateral adnexa are unremarkable. Other: Negative for free air or free fluid. Musculoskeletal: No acute or significant osseous findings. IMPRESSION: 1. Negative for acute intra-abdominal or pelvic abnormality. 2. Postsurgical changes of the stomach consistent with history of gastric sleeve surgery. 3. Borderline splenomegaly. Electronically Signed   By: Donavan Foil M.D.   On: 03/18/2020 22:17   DG CHEST PORT 1 VIEW  Result Date: 03/18/2020 CLINICAL DATA:  Recent bariatric surgery, fever, tachycardia, weakness, COVID-19 positive EXAM: PORTABLE CHEST 1 VIEW COMPARISON:  03/18/2020, 12/07/2019 FINDINGS: Single frontal view of the chest demonstrates an unremarkable cardiac silhouette. No airspace disease, effusion, or pneumothorax. No acute bony abnormalities. IMPRESSION: 1. No acute intrathoracic process. Electronically Signed   By: Randa Ngo M.D.   On: 03/18/2020 23:34    Anti-infectives: Anti-infectives (From admission, onward)   Start     Dose/Rate Route Frequency Ordered Stop   03/20/20 1000  remdesivir 100 mg in sodium chloride 0.9 % 100 mL IVPB  Status:  Discontinued       "Followed by" Linked Group Details   100 mg 200 mL/hr over 30 Minutes Intravenous Daily 03/19/20 0210 03/19/20 0222   03/19/20 1000  remdesivir 100 mg in  sodium chloride 0.9 % 100 mL IVPB       "Followed by" Linked Group Details   100 mg 200 mL/hr over 30 Minutes Intravenous Daily 03/18/20 2324 03/23/20 0959   03/18/20 2330  remdesivir 200 mg in sodium chloride 0.9% 250 mL IVPB  Status:  Discontinued        "Followed by" Linked Group Details   200 mg 580 mL/hr over 30 Minutes Intravenous Once 03/19/20 0210 03/19/20 0222   03/18/20 2330  remdesivir 200 mg in sodium chloride 0.9% 250 mL IVPB       "Followed by" Linked Group Details   200 mg 580 mL/hr over 30 Minutes Intravenous Once 03/18/20 2324 03/19/20 0040      Assessment/Plan: S/p lap sleeve gastrectomy 03/05/2020 Severe obesity  prediabetes COVID + Acute hemolytic anemia  Got 2u prbc overnight and hgb didn't respond Getting more prbc now Workup c/w hemolytic anemia Reviewed CT - nothing of note intra-abd - no free air, free fluid, hematoma  Their plan is to treat her HA with steroids.  Discussed with them and pt about small increased risk of staple line leak but steroids are best option for her at this point per medical teams  [facility] for full bariatric liquid diet, soft protein - will see if there is a bariatric diet in epic for her current postop diet phase, will add bariatric shakes  VTE prophylaxis - at little higher risk bc of recent bariatric surgery and covid but has HA, d/w with [facility] and hem/onc. hem ok with lovenox 40 mg qday.  Will monitor with labs. D/w pt about doing leg/foot/ankle exercises. Ambulate in room  Discussed with pt eating techniques for this diet phase since will be missing postop diet class.   Appreciate the excellent care she is receiving from [facility] and hem/onc  [name], MD, FACS General, Bariatric, & Minimally Invasive Surgery [facility], [place]    LOS: 1 day    [name] 03/19/2020

## 2020-03-19 NOTE — Consult Note (Signed)
[place] CONSULT NOTE  Patient Care Team: [name], DO as PCP - General (Family Medicine) Berniece Salines, DO as PCP - Cardiology (Cardiology)  CHIEF COMPLAINTS/PURPOSE OF CONSULTATION:  Severe anemia  ASSESSMENT & PLAN:  No problem-specific Assessment & Plan notes found for this encounter.  1. Coombs positive autoimmune hemolytic anemia Rare cases of coombs positive AIHA have been reported with COVID 19. We started her on prednisone today at 61m/kg and she is doing well. Significant improvement in bilirubin and Hb compared to this AM. If hb greater than 10, then will slowly start prednisone taper. She understands that prednisone can cause delayed wound healing.  2, DVT prophylaxis Given COVID 19 acute infection as well as recent gastric sleeve surgery, it is reasonable to continue lovenox 40 mg Peyton daily and monitor her closely. She is at very high risk of DVT.  Please send CBC, CMP, LDH, retic count daily Folic acid 116mpo daily  We will continue to monitor her daily.     HISTORY OF PRESENTING ILLNESS:  Cindy Gilbert [age].o. female is here because of severe anemia.  Ms KoHendlerecently underwent gastric sleeve surgery about 2 weeks ago and soon after was diagnosed with COVID 1946She had fatigue and attributed to COVID 19 but fatigue continued to worsen so came to the ER. She otherwise has a mild headache. She was found to have severe anemia which was thought to be possible hemolysis vs GI bleed hence hematology was consulted. She denies any prior history of anemia. She denies any known G6PD deficiency. No new medications or antibiotics recently. No bleeding, hematochezia or melena Besides COVID 19 and obesity , no other complaints at baseline.  REVIEW OF SYSTEMS:   Constitutional: Denies fevers, chills or abnormal night sweats Eyes: Denies blurriness of vision, double vision or watery eyes Ears, nose, mouth, throat, and face: Denies mucositis  or sore throat Respiratory: Denies cough, dyspnea or wheezes Cardiovascular: Denies palpitation, chest discomfort or lower extremity swelling Gastrointestinal:  Denies nausea, heartburn or change in bowel habits Skin: Denies abnormal skin rashes Lymphatics: Denies new lymphadenopathy or easy bruising Neurological:Denies numbness, tingling or new weaknesses Behavioral/Psych: Mood is stable, no new changes  All other systems were reviewed with the patient and are negative.  MEDICAL HISTORY:  Past Medical History:  Diagnosis Date  . Allergy    Seasonal  . Anxiety 03/22/2018  . Controlled substance agreement signed 12/18/2016   For xanax  . COVID-19   . Depression    previously on lexapro, celexa,   . Dyslipidemia   . Heart murmur 12/27/2019  . Hip pain, chronic    Right  . Insomnia   . Morbid obesity ([place]8/22/2021  . Multinodular goiter (nontoxic) 03/16/2019   Small nodules- no need for bx or follow up imaging 2020  . Ovarian cyst    left  . Right knee pain   . Vitamin D deficiency     SURGICAL HISTORY: Past Surgical History:  Procedure Laterality Date  . ADENOIDECTOMY    . CESAREAN SECTION  2001  . DILATION AND CURETTAGE OF UTERUS    . LAPAROSCOPIC GASTRIC SLEEVE RESECTION N/A 03/05/2020   Procedure: LAPAROSCOPIC GASTRIC SLEEVE RESECTION;  Surgeon: WiGreer PickerelMD;  Location: [facility];  Service: General;  Laterality: N/A;  . TONSILLECTOMY    . UPPER GI ENDOSCOPY N/A 03/05/2020   Procedure: UPPER GI ENDOSCOPY;  Surgeon: WiGreer PickerelMD;  Location: [facility];  Service: General;  Laterality: N/A;  SOCIAL HISTORY: Social History   Socioeconomic History  . Marital status: Single    Spouse name: Not on file  . Number of children: Not on file  . Years of education: Not on file  . Highest education level: Not on file  Occupational History  . Not on file  Tobacco Use  . Smoking status: Former Smoker    Packs/day: 0.25    Years: 10.00    Pack years: 2.50    Types:  Cigarettes    Quit date: 02/23/2014    Years since quitting: 6.0  . Smokeless tobacco: Never Used  Vaping Use  . Vaping Use: Never used  Substance and Sexual Activity  . Alcohol use: Yes    Alcohol/week: 5.0 standard drinks    Types: 5 Glasses of wine per week  . Drug use: No  . Sexual activity: Not Currently    Birth control/protection: None  Other Topics Concern  . Not on file  Social History Narrative  . Not on file   Social Determinants of Health   Financial Resource Strain: Not on file  Food Insecurity: Not on file  Transportation Needs: Not on file  Physical Activity: Not on file  Stress: Not on file  Social Connections: Not on file  Intimate Partner Violence: Not on file    FAMILY HISTORY: Family History  Problem Relation Age of Onset  . Heart disease Father   . Stroke Father   . Heart attack Father   . Hypertension Father   . Atrial fibrillation Maternal Aunt   . Cancer Paternal Aunt        Breast  . Heart murmur Maternal Grandmother   . Asthma Paternal Grandmother   . Hypertension Mother   . Hyperlipidemia Mother     ALLERGIES:  is allergic to [place] [zolpidem tartrate], belviq [lorcaserin], bupropion, and cymbalta [duloxetine hcl].  MEDICATIONS:  Current Facility-Administered Medications  Medication Dose Route Frequency Provider Last Rate Last Admin  . 0.9 %  sodium chloride infusion (Manually program via Guardrails IV Fluids)   Intravenous Once Toy Baker, MD   Held at 03/18/20 1936  . 0.9 %  sodium chloride infusion  250 mL Intravenous PRN Doutova, Anastassia, MD      . acetaminophen (TYLENOL) tablet 650 mg  650 mg Oral Q6H PRN Doutova, Anastassia, MD      . diphenhydrAMINE (BENADRYL) injection 12.5 mg  12.5 mg Intravenous Q2H PRN Allie Bossier, MD   12.5 mg at 03/19/20 1303  . enoxaparin (LOVENOX) injection 40 mg  40 mg Subcutaneous Daily [name], MD   40 mg at 03/19/20 1150  . folic acid (FOLVITE) tablet 1 mg  1 mg Oral Daily Allie Bossier, MD   1 mg at 03/19/20 1150  . guaiFENesin-dextromethorphan (ROBITUSSIN DM) 100-10 MG/5ML syrup 10 mL  10 mL Oral Q4H PRN Doutova, Anastassia, MD      . HYDROcodone-acetaminophen (NORCO/VICODIN) 5-325 MG per tablet 1-2 tablet  1-2 tablet Oral Q4H PRN Doutova, Anastassia, MD      . metoprolol tartrate (LOPRESSOR) tablet 12.5 mg  12.5 mg Oral BID Allie Bossier, MD      . ondansetron [facility]) tablet 4 mg  4 mg Oral Q6H PRN Toy Baker, MD       Or  . ondansetron (ZOFRAN) injection 4 mg  4 mg Intravenous Q6H PRN Doutova, Anastassia, MD      . pantoprazole (PROTONIX) EC tablet 40 mg  40 mg Oral Daily Toy Baker, MD  40 mg at 03/19/20 0919  . predniSONE (DELTASONE) tablet 100 mg  100 mg Oral Q breakfast Lilinoe Acklin, Arletha Pili, MD   100 mg at 03/19/20 0919  . protein supplement (ENSURE MAX) liquid  11 oz Oral BID [name], MD   11 oz at 03/19/20 1240  . remdesivir 100 mg in sodium chloride 0.9 % 100 mL IVPB  100 mg Intravenous Daily Toy Baker, MD   Stopped at 03/19/20 0930  . sodium chloride flush (NS) 0.9 % injection 3 mL  3 mL Intravenous Q12H Doutova, Anastassia, MD   3 mL at 03/19/20 0216  . sodium chloride flush (NS) 0.9 % injection 3 mL  3 mL Intravenous Q12H Doutova, Anastassia, MD   3 mL at 03/19/20 0217  . sodium chloride flush (NS) 0.9 % injection 3 mL  3 mL Intravenous PRN Doutova, Anastassia, MD      . traMADol (ULTRAM) tablet 50 mg  50 mg Oral Q6H PRN Doutova, Anastassia, MD         PHYSICAL EXAMINATION: ECOG PERFORMANCE STATUS: 2 - Symptomatic, <50% confined to bed  Vitals:   03/19/20 1624 03/19/20 2025  BP: (!) 137/98 127/82  Pulse: 98 89  Resp: 18 20  Temp: 98.9 F (37.2 C) 97.9 F (36.6 C)  SpO2: 100% 100%   Filed Weights   03/18/20 1607  Weight: 232 lb (105.2 kg)   GENERAL:alert, no distress and comfortable. SKIN: skin color, texture, turgor are normal, no rashes or significant lesions EYES: normal, conjunctiva are pink and  non-injected, sclera clear OROPHARYNX:no exudate, no erythema and lips, buccal mucosa, and tongue normal  NECK: supple, thyroid normal size, non-tender, without nodularity LYMPH:  no palpable lymphadenopathy in the cervical, axillary or inguinal LUNGS: clear to auscultation and percussion with normal breathing effort HEART: regular rate & rhythm and no murmurs and no lower extremity edema ABDOMEN:abdomen soft, non-tender and normal bowel sounds Musculoskeletal:no cyanosis of digits and no clubbing  PSYCH: alert & oriented x 3 with fluent speech NEURO: no focal motor/sensory deficits  LABORATORY DATA:  I have reviewed the data as listed Lab Results  Component Value Date   WBC 12.4 (H) 03/19/2020   HGB 9.3 (L) 03/19/2020   HCT 27.8 (L) 03/19/2020   MCV 90.6 03/19/2020   PLT 287 03/19/2020   @LASTCHEM @  RADIOGRAPHIC STUDIES: I have personally reviewed the radiological images as listed and agreed with the findings in the report. CT Abdomen Pelvis Wo Contrast  Result Date: 03/18/2020 CLINICAL DATA:  Postop gastric sleeve fever EXAM: CT ABDOMEN AND PELVIS WITHOUT CONTRAST TECHNIQUE: Multidetector CT imaging of the abdomen and pelvis was performed following the standard protocol without IV contrast. COMPARISON:  None. FINDINGS: Lower chest: Lung bases demonstrate no acute consolidation or effusion. Differential cardiac blood pool suggesting anemia. Hepatobiliary: No focal liver abnormality is seen. No gallstones, gallbladder wall thickening, or biliary dilatation. Pancreas: Unremarkable. No pancreatic ductal dilatation or surrounding inflammatory changes. Spleen: Borderline to mildly enlarged. Adrenals/Urinary Tract: Adrenal glands are unremarkable. Kidneys are normal, without renal calculi, focal lesion, or hydronephrosis. Bladder is unremarkable. Stomach/Bowel: Postsurgical changes of the stomach consistent with history of gastric sleeve surgery. No dilated small or large bowel. Negative  appendix. Vascular/Lymphatic: No significant vascular findings are present. No enlarged abdominal or pelvic lymph nodes. Reproductive: Uterus and bilateral adnexa are unremarkable. Other: Negative for free air or free fluid. Musculoskeletal: No acute or significant osseous findings. IMPRESSION: 1. Negative for acute intra-abdominal or pelvic abnormality. 2. Postsurgical changes of the stomach  consistent with history of gastric sleeve surgery. 3. Borderline splenomegaly. Electronically Signed   By: Donavan Foil M.D.   On: 03/18/2020 22:17   DG CHEST PORT 1 VIEW  Result Date: 03/18/2020 CLINICAL DATA:  Recent bariatric surgery, fever, tachycardia, weakness, COVID-19 positive EXAM: PORTABLE CHEST 1 VIEW COMPARISON:  03/18/2020, 12/07/2019 FINDINGS: Single frontal view of the chest demonstrates an unremarkable cardiac silhouette. No airspace disease, effusion, or pneumothorax. No acute bony abnormalities. IMPRESSION: 1. No acute intrathoracic process. Electronically Signed   By: Randa Ngo M.D.   On: 03/18/2020 23:34   Labs reviewed Severe anemia, normal WBC and normal platelet count. LDH high Retic count and bilirubin support hemolysis Coombs test positive for IgG and complement. No evidence of nutritional deficiency  All questions were answered. The patient knows to call the clinic with any problems, questions or concerns. I spent 50 minutes in the care of this patient including H and P, review of records, counseling and coordination of care.     Benay Pike, MD 03/19/2020 9:29 PM

## 2020-03-19 NOTE — Progress Notes (Signed)
PT instructed on Flutter device usage.

## 2020-03-19 NOTE — Progress Notes (Signed)
Patient in bed receiving blood transfusion while watching tv.  Patient stated that she is "feeling much better with each bag".  Patient denies any pain or nausea.  Patient has tolerated fluids and some eggs today.  Discussed diet with patient and encouraged to continue drinking fluids and protein until follow up with RD.

## 2020-03-19 NOTE — ED Notes (Signed)
Given breakfast tray.

## 2020-03-19 NOTE — Progress Notes (Signed)
Will see pt later this am  Appears pt has hemolytic anemia which has been reported with covid infections.   I personally reviewed her CT - no apparent intra-abdominal complications. No free air. No free fluid. No apparent blood in abdomen  rec hematology consult Appreciate triad mgmt Pt needs to be on bariatric full liquid + bariatric shakes  [name], MD, FACS General, Bariatric, & Minimally Invasive Surgery [facility], [place]

## 2020-03-19 NOTE — ED Notes (Signed)
Assisted pt to bedside commode and back, pt ambulatory and continent with urine.

## 2020-03-20 DIAGNOSIS — E042 Nontoxic multinodular goiter: Secondary | ICD-10-CM

## 2020-03-20 DIAGNOSIS — E559 Vitamin D deficiency, unspecified: Secondary | ICD-10-CM

## 2020-03-20 LAB — CBC WITH DIFFERENTIAL/PLATELET
Abs Immature Granulocytes: 0.9 10*3/uL — ABNORMAL HIGH (ref 0.00–0.07)
Abs Immature Granulocytes: 0.9 10*3/uL — ABNORMAL HIGH (ref 0.00–0.07)
Abs Immature Granulocytes: 0.79 10*3/uL — ABNORMAL HIGH (ref 0.00–0.07)
Basophils Absolute: 0.1 10*3/uL (ref 0.0–0.1)
Basophils Absolute: 0.1 10*3/uL (ref 0.0–0.1)
Basophils Absolute: 0.1 10*3/uL (ref 0.0–0.1)
Basophils Relative: 1 %
Basophils Relative: 1 %
Basophils Relative: 1 %
Eosinophils Absolute: 0 10*3/uL (ref 0.0–0.5)
Eosinophils Absolute: 0.1 10*3/uL (ref 0.0–0.5)
Eosinophils Absolute: 0 10*3/uL (ref 0.0–0.5)
Eosinophils Relative: 0 %
Eosinophils Relative: 1 %
Eosinophils Relative: 0 %
HCT: 24.2 % — ABNORMAL LOW (ref 36.0–46.0)
HCT: 26.3 % — ABNORMAL LOW (ref 36.0–46.0)
HCT: 31.4 % — ABNORMAL LOW (ref 36.0–46.0)
Hemoglobin: 8.2 g/dL — ABNORMAL LOW (ref 12.0–15.0)
Hemoglobin: 8.9 g/dL — ABNORMAL LOW (ref 12.0–15.0)
Hemoglobin: 9.8 g/dL — ABNORMAL LOW (ref 12.0–15.0)
Immature Granulocytes: 7 %
Immature Granulocytes: 7 %
Immature Granulocytes: 7 %
Lymphocytes Relative: 27 %
Lymphocytes Relative: 30 %
Lymphocytes Relative: 18 %
Lymphs Abs: 3.3 10*3/uL (ref 0.7–4.0)
Lymphs Abs: 3.8 10*3/uL (ref 0.7–4.0)
Lymphs Abs: 2 10*3/uL (ref 0.7–4.0)
MCH: 30.6 pg (ref 26.0–34.0)
MCH: 30.9 pg (ref 26.0–34.0)
MCH: 30.9 pg (ref 26.0–34.0)
MCHC: 33.9 g/dL (ref 30.0–36.0)
MCHC: 33.8 g/dL (ref 30.0–36.0)
MCHC: 31.2 g/dL (ref 30.0–36.0)
MCV: 90.3 fL (ref 80.0–100.0)
MCV: 91.3 fL (ref 80.0–100.0)
MCV: 99.1 fL (ref 80.0–100.0)
Monocytes Absolute: 0.7 10*3/uL (ref 0.1–1.0)
Monocytes Absolute: 0.6 10*3/uL (ref 0.1–1.0)
Monocytes Absolute: 0.4 10*3/uL (ref 0.1–1.0)
Monocytes Relative: 5 %
Monocytes Relative: 5 %
Monocytes Relative: 4 %
Neutro Abs: 7.5 10*3/uL (ref 1.7–7.7)
Neutro Abs: 7.3 10*3/uL (ref 1.7–7.7)
Neutro Abs: 8.1 10*3/uL — ABNORMAL HIGH (ref 1.7–7.7)
Neutrophils Relative %: 60 %
Neutrophils Relative %: 56 %
Neutrophils Relative %: 70 %
Platelets: 282 10*3/uL (ref 150–400)
Platelets: 275 10*3/uL (ref 150–400)
Platelets: 322 10*3/uL (ref 150–400)
RBC: 2.68 MIL/uL — ABNORMAL LOW (ref 3.87–5.11)
RBC: 2.88 MIL/uL — ABNORMAL LOW (ref 3.87–5.11)
RBC: 3.17 MIL/uL — ABNORMAL LOW (ref 3.87–5.11)
RDW: 15.1 % (ref 11.5–15.5)
RDW: 15.7 % — ABNORMAL HIGH (ref 11.5–15.5)
RDW: 16.6 % — ABNORMAL HIGH (ref 11.5–15.5)
WBC: 12.5 10*3/uL — ABNORMAL HIGH (ref 4.0–10.5)
WBC: 12.8 10*3/uL — ABNORMAL HIGH (ref 4.0–10.5)
WBC: 11.5 10*3/uL — ABNORMAL HIGH (ref 4.0–10.5)
nRBC: 1.6 % — ABNORMAL HIGH (ref 0.0–0.2)
nRBC: 1.3 % — ABNORMAL HIGH (ref 0.0–0.2)
nRBC: 1 % — ABNORMAL HIGH (ref 0.0–0.2)

## 2020-03-20 LAB — BPAM RBC
Blood Product Expiration Date: 202202162359
Blood Product Expiration Date: 202202162359
Blood Product Expiration Date: 202202182359
ISSUE DATE / TIME: 202201242223
ISSUE DATE / TIME: 202201251012
ISSUE DATE / TIME: 202201251351
Unit Type and Rh: 6200
Unit Type and Rh: 6200
Unit Type and Rh: 6200

## 2020-03-20 LAB — RETICULOCYTES
Immature Retic Fract: 43.7 % — ABNORMAL HIGH (ref 2.3–15.9)
Immature Retic Fract: 48.8 % — ABNORMAL HIGH (ref 2.3–15.9)
Immature Retic Fract: 43 % — ABNORMAL HIGH (ref 2.3–15.9)
RBC.: 2.65 MIL/uL — ABNORMAL LOW (ref 3.87–5.11)
RBC.: 2.88 MIL/uL — ABNORMAL LOW (ref 3.87–5.11)
RBC.: 3.1 MIL/uL — ABNORMAL LOW (ref 3.87–5.11)
Retic Count, Absolute: 134.1 10*3/uL (ref 19.0–186.0)
Retic Count, Absolute: 159.3 10*3/uL (ref 19.0–186.0)
Retic Count, Absolute: 190.7 10*3/uL — ABNORMAL HIGH (ref 19.0–186.0)
Retic Ct Pct: 5.1 % — ABNORMAL HIGH (ref 0.4–3.1)
Retic Ct Pct: 5.5 % — ABNORMAL HIGH (ref 0.4–3.1)
Retic Ct Pct: 6.2 % — ABNORMAL HIGH (ref 0.4–3.1)

## 2020-03-20 LAB — COMPREHENSIVE METABOLIC PANEL
ALT: 20 U/L (ref 0–44)
ALT: 22 U/L (ref 0–44)
ALT: 30 U/L (ref 0–44)
AST: 32 U/L (ref 15–41)
AST: 36 U/L (ref 15–41)
AST: 46 U/L — ABNORMAL HIGH (ref 15–41)
Albumin: 3.4 g/dL — ABNORMAL LOW (ref 3.5–5.0)
Albumin: 3.9 g/dL (ref 3.5–5.0)
Albumin: 4.3 g/dL (ref 3.5–5.0)
Alkaline Phosphatase: 50 U/L (ref 38–126)
Alkaline Phosphatase: 55 U/L (ref 38–126)
Alkaline Phosphatase: 60 U/L (ref 38–126)
Anion gap: 10 (ref 5–15)
Anion gap: 12 (ref 5–15)
Anion gap: 10 (ref 5–15)
BUN: 14 mg/dL (ref 6–20)
BUN: 17 mg/dL (ref 6–20)
BUN: 18 mg/dL (ref 6–20)
CO2: 23 mmol/L (ref 22–32)
CO2: 24 mmol/L (ref 22–32)
CO2: 23 mmol/L (ref 22–32)
Calcium: 8.9 mg/dL (ref 8.9–10.3)
Calcium: 8.9 mg/dL (ref 8.9–10.3)
Calcium: 9.2 mg/dL (ref 8.9–10.3)
Chloride: 105 mmol/L (ref 98–111)
Chloride: 103 mmol/L (ref 98–111)
Chloride: 105 mmol/L (ref 98–111)
Creatinine, Ser: 0.66 mg/dL (ref 0.44–1.00)
Creatinine, Ser: 0.78 mg/dL (ref 0.44–1.00)
Creatinine, Ser: 0.75 mg/dL (ref 0.44–1.00)
GFR, Estimated: >60 mL/min (ref >60)
GFR, Estimated: >60 mL/min (ref >60)
GFR, Estimated: >60 mL/min (ref >60)
Glucose, Bld: 106 mg/dL — ABNORMAL HIGH (ref 70–99)
Glucose, Bld: 97 mg/dL (ref 70–99)
Glucose, Bld: 149 mg/dL — ABNORMAL HIGH (ref 70–99)
Potassium: 3.6 mmol/L (ref 3.5–5.1)
Potassium: 3.6 mmol/L (ref 3.5–5.1)
Potassium: 4.4 mmol/L (ref 3.5–5.1)
Sodium: 138 mmol/L (ref 135–145)
Sodium: 139 mmol/L (ref 135–145)
Sodium: 138 mmol/L (ref 135–145)
Total Bilirubin: 2.7 mg/dL — ABNORMAL HIGH (ref 0.3–1.2)
Total Bilirubin: 3.6 mg/dL — ABNORMAL HIGH (ref 0.3–1.2)
Total Bilirubin: 2.5 mg/dL — ABNORMAL HIGH (ref 0.3–1.2)
Total Protein: 6.1 g/dL — ABNORMAL LOW (ref 6.5–8.1)
Total Protein: 6.7 g/dL (ref 6.5–8.1)
Total Protein: 7.7 g/dL (ref 6.5–8.1)

## 2020-03-20 LAB — TYPE AND SCREEN
ABO/RH(D): A POS
Antibody Screen: NEGATIVE
Unit division: 0
Unit division: 0
Unit division: 0

## 2020-03-20 LAB — D-DIMER, QUANTITATIVE: D-Dimer, Quant: 3.09 ug/mL-FEU — ABNORMAL HIGH (ref 0.00–0.50)

## 2020-03-20 LAB — ENA+DNA/DS+ANTICH+CENTRO+JO...
Anti JO-1: <0.2 AI (ref 0.0–0.9)
Centromere Ab Screen: <0.2 AI (ref 0.0–0.9)
Chromatin Ab SerPl-aCnc: <0.2 AI (ref 0.0–0.9)
ENA SM Ab Ser-aCnc: <0.2 AI (ref 0.0–0.9)
Ribonucleic Protein: 1.6 AI — ABNORMAL HIGH (ref 0.0–0.9)
SSA (Ro) (ENA) Antibody, IgG: <0.2 AI (ref 0.0–0.9)
SSB (La) (ENA) Antibody, IgG: <0.2 AI (ref 0.0–0.9)
Scleroderma (Scl-70) (ENA) Antibody, IgG: <0.2 AI (ref 0.0–0.9)
ds DNA Ab: <1 IU/mL (ref 0–9)

## 2020-03-20 LAB — C4 COMPLEMENT: Complement C4, Body Fluid: 43 mg/dL — ABNORMAL HIGH (ref 12–38)

## 2020-03-20 LAB — HAPTOGLOBIN
Haptoglobin: <10 mg/dL — ABNORMAL LOW (ref 42–296)
Haptoglobin: <10 mg/dL — ABNORMAL LOW (ref 42–296)

## 2020-03-20 LAB — URINE CULTURE: Culture: NO GROWTH

## 2020-03-20 LAB — PHOSPHORUS: Phosphorus: 3.4 mg/dL (ref 2.5–4.6)

## 2020-03-20 LAB — ANA W/REFLEX IF POSITIVE: Anti Nuclear Antibody (ANA): POSITIVE — AB

## 2020-03-20 LAB — LACTATE DEHYDROGENASE
LDH: 738 U/L — ABNORMAL HIGH (ref 98–192)
LDH: 746 U/L — ABNORMAL HIGH (ref 98–192)
LDH: 1059 U/L — ABNORMAL HIGH (ref 98–192)

## 2020-03-20 LAB — FERRITIN: Ferritin: 893 ng/mL — ABNORMAL HIGH (ref 11–307)

## 2020-03-20 LAB — C-REACTIVE PROTEIN: CRP: 4.3 mg/dL — ABNORMAL HIGH (ref <1.0)

## 2020-03-20 LAB — MAGNESIUM: Magnesium: 2.3 mg/dL (ref 1.7–2.4)

## 2020-03-20 LAB — C3 COMPLEMENT: C3 Complement: 172 mg/dL — ABNORMAL HIGH (ref 82–167)

## 2020-03-20 MED ORDER — METHYLPREDNISOLONE SODIUM SUCC 125 MG IJ SOLR
80.0000 mg | Freq: Every day | INTRAMUSCULAR | Status: DC
  Administered 2020-03-21: 80 mg via INTRAVENOUS
  Filled 2020-03-20: qty 2

## 2020-03-20 MED ORDER — ENOXAPARIN SODIUM 30 MG/0.3ML ~~LOC~~ SOLN
30.0000 mg | Freq: Two times a day (BID) | SUBCUTANEOUS | Status: DC
  Administered 2020-03-20 – 2020-03-22 (×5): 30 mg via SUBCUTANEOUS
  Filled 2020-03-20 (×6): qty 0.3

## 2020-03-20 NOTE — Progress Notes (Signed)
[place] NOTE  Patient Care Team: [name], DO as PCP - General (Family Medicine) Berniece Salines, DO as PCP - Cardiology (Cardiology)  CHIEF COMPLAINTS/PURPOSE OF CONSULTATION:  Fatigue  ASSESSMENT & PLAN:  No problem-specific Assessment & Plan notes found for this encounter.  Coombs positive autoimmune hemolytic anemia Rare cases of coombs positive AIHA have been reported with COVID 19. She is currently on Solu-Medrol, had some difficulty with oral prednisone. Hemolysis labs today have been relatively stable, CBC this morning showed a hemoglobin of 8.9 which has improved. If hb greater than 10, then will slowly start prednisone taper. She understands that prednisone can cause delayed wound healing. No concerns today, patient feels tremendously better, we will continue to monitor  2, DVT prophylaxis Given COVID 19 acute infection as well as recent gastric sleeve surgery, it is reasonable to continue lovenox for anticoagulation prophylaxis per Dr. Dois Davenport recommendation.  She is at very high risk of DVT.  Please send CBC, CMP, LDH, retic count daily Folic acid 61m po daily  We will continue to monitor her daily.  HISTORY OF PRESENTING ILLNESS:  Cindy Gilbert 45y.o. female is here because of AIHA  Interim history  She feels much better today, she is able to walk around the room, using the bathroom on her own.   She had an episode of nausea and vomiting after she ate food and may have thrown up her prednisone. She denies any bleeding.  She has some pain over her feet, no swelling, no chest pain. No diarrhea, no change in urination.  Urine still very yellow according to the patient Rest of the pertinent review of systems reviewed and unremarkable.  MEDICAL HISTORY:  Past Medical History:  Diagnosis Date  . Allergy    Seasonal  . Anxiety 03/22/2018  . Controlled substance agreement signed 12/18/2016   For xanax  . COVID-19   .  Depression    previously on lexapro, celexa,   . Dyslipidemia   . Heart murmur 12/27/2019  . Hip pain, chronic    Right  . Insomnia   . Morbid obesity ([place] 10/15/2019  . Multinodular goiter (nontoxic) 03/16/2019   Small nodules- no need for bx or follow up imaging 2020  . Ovarian cyst    left  . Right knee pain   . Vitamin D deficiency     SURGICAL HISTORY: Past Surgical History:  Procedure Laterality Date  . ADENOIDECTOMY    . CESAREAN SECTION  2001  . DILATION AND CURETTAGE OF UTERUS    . LAPAROSCOPIC GASTRIC SLEEVE RESECTION N/A 03/05/2020   Procedure: LAPAROSCOPIC GASTRIC SLEEVE RESECTION;  Surgeon: WGreer Pickerel MD;  Location: [facility];  Service: General;  Laterality: N/A;  . TONSILLECTOMY    . UPPER GI ENDOSCOPY N/A 03/05/2020   Procedure: UPPER GI ENDOSCOPY;  Surgeon: WGreer Pickerel MD;  Location: [facility];  Service: General;  Laterality: N/A;    SOCIAL HISTORY: Social History   Socioeconomic History  . Marital status: Single    Spouse name: Not on file  . Number of children: Not on file  . Years of education: Not on file  . Highest education level: Not on file  Occupational History  . Not on file  Tobacco Use  . Smoking status: Former Smoker    Packs/day: 0.25    Years: 10.00    Pack years: 2.50    Types: Cigarettes    Quit date: 02/23/2014    Years since quitting: 6.0  .  Smokeless tobacco: Never Used  Vaping Use  . Vaping Use: Never used  Substance and Sexual Activity  . Alcohol use: Yes    Alcohol/week: 5.0 standard drinks    Types: 5 Glasses of wine per week  . Drug use: No  . Sexual activity: Not Currently    Birth control/protection: None  Other Topics Concern  . Not on file  Social History Narrative  . Not on file   Social Determinants of Health   Financial Resource Strain: Not on file  Food Insecurity: Not on file  Transportation Needs: Not on file  Physical Activity: Not on file  Stress: Not on file  Social Connections: Not on file   Intimate Partner Violence: Not on file    FAMILY HISTORY: Family History  Problem Relation Age of Onset  . Heart disease Father   . Stroke Father   . Heart attack Father   . Hypertension Father   . Atrial fibrillation Maternal Aunt   . Cancer Paternal Aunt        Breast  . Heart murmur Maternal Grandmother   . Asthma Paternal Grandmother   . Hypertension Mother   . Hyperlipidemia Mother     ALLERGIES:  is allergic to [place] [zolpidem tartrate], belviq [lorcaserin], bupropion, and cymbalta [duloxetine hcl].  MEDICATIONS:  Current Facility-Administered Medications  Medication Dose Route Frequency Provider Last Rate Last Admin  . 0.9 %  sodium chloride infusion (Manually program via Guardrails IV Fluids)   Intravenous Once Toy Baker, MD   Held at 03/18/20 1936  . 0.9 %  sodium chloride infusion  250 mL Intravenous PRN Doutova, Anastassia, MD      . acetaminophen (TYLENOL) tablet 650 mg  650 mg Oral Q6H PRN Doutova, Anastassia, MD      . diphenhydrAMINE (BENADRYL) injection 12.5 mg  12.5 mg Intravenous Q2H PRN Allie Bossier, MD   12.5 mg at 03/19/20 1303  . enoxaparin (LOVENOX) injection 30 mg  30 mg Subcutaneous Q12H [name], MD      . folic acid (FOLVITE) tablet 1 mg  1 mg Oral Daily Allie Bossier, MD   1 mg at 03/20/20 1037  . guaiFENesin-dextromethorphan (ROBITUSSIN DM) 100-10 MG/5ML syrup 10 mL  10 mL Oral Q4H PRN Doutova, Anastassia, MD      . HYDROcodone-acetaminophen (NORCO/VICODIN) 5-325 MG per tablet 1-2 tablet  1-2 tablet Oral Q4H PRN Doutova, Anastassia, MD      . melatonin tablet 5 mg  5 mg Oral QHS PRN Hollace Hayward K, NP   5 mg at 03/19/20 2254  . [START ON 03/21/2020] methylPREDNISolone sodium succinate (SOLU-MEDROL) 125 mg/2 mL injection 80 mg  80 mg Intravenous Daily Treyson Axel, MD      . metoprolol tartrate (LOPRESSOR) tablet 12.5 mg  12.5 mg Oral BID Allie Bossier, MD      . ondansetron [facility]) tablet 4 mg  4 mg Oral Q6H PRN Toy Baker, MD       Or  . ondansetron (ZOFRAN) injection 4 mg  4 mg Intravenous Q6H PRN Toy Baker, MD   4 mg at 03/20/20 1231  . pantoprazole (PROTONIX) EC tablet 40 mg  40 mg Oral Daily Doutova, Anastassia, MD   40 mg at 03/20/20 1038  . protein supplement (ENSURE MAX) liquid  11 oz Oral BID [name], MD   11 oz at 03/20/20 1102  . remdesivir 100 mg in sodium chloride 0.9 % 100 mL IVPB  100 mg Intravenous Daily Doutova,  Anastassia, MD 200 mL/hr at 03/20/20 1103 100 mg at 03/20/20 1103  . sodium chloride flush (NS) 0.9 % injection 3 mL  3 mL Intravenous Q12H Doutova, Anastassia, MD   3 mL at 03/20/20 1039  . sodium chloride flush (NS) 0.9 % injection 3 mL  3 mL Intravenous Q12H Doutova, Anastassia, MD   3 mL at 03/20/20 1039  . sodium chloride flush (NS) 0.9 % injection 3 mL  3 mL Intravenous PRN Doutova, Anastassia, MD      . traMADol (ULTRAM) tablet 50 mg  50 mg Oral Q6H PRN Doutova, Anastassia, MD         PHYSICAL EXAMINATION: ECOG PERFORMANCE STATUS: 0 - Asymptomatic  Vitals:   03/20/20 0425 03/20/20 1412  BP: 125/77 109/75  Pulse: 74 95  Resp: 17 16  Temp: 98.6 F (37 C) 98 F (36.7 C)  SpO2: 99% 100%   Filed Weights   03/18/20 1607  Weight: 232 lb (105.2 kg)    GENERAL:alert, no distress and comfortable SKIN: No major concerns EYES: normal, conjunctiva are pink and non-injected, sclera clear OROPHARYNX:no exudate, no erythema and lips, buccal mucosa, and tongue normal  NECK: supple, thyroid normal size, non-tender, without nodularity LYMPH:  no palpable lymphadenopathy in the cervical, axillary or inguinal LUNGS: clear to auscultation and percussion with normal breathing effort HEART: regular rate & rhythm and no murmurs and no lower extremity edema ABDOMEN:abdomen soft, non-tender and normal bowel sounds Musculoskeletal:no cyanosis of digits and no clubbing  PSYCH: alert & oriented x 3 with fluent speech NEURO: no focal motor/sensory  deficits  LABORATORY DATA:  I have reviewed the data as listed Lab Results  Component Value Date   WBC 12.8 (H) 03/20/2020   HGB 8.9 (L) 03/20/2020   HCT 26.3 (L) 03/20/2020   MCV 91.3 03/20/2020   PLT 275 03/20/2020   @LASTCHEM @  RADIOGRAPHIC STUDIES: I have personally reviewed the radiological images as listed and agreed with the findings in the report. CT Abdomen Pelvis Wo Contrast  Result Date: 03/18/2020 CLINICAL DATA:  Postop gastric sleeve fever EXAM: CT ABDOMEN AND PELVIS WITHOUT CONTRAST TECHNIQUE: Multidetector CT imaging of the abdomen and pelvis was performed following the standard protocol without IV contrast. COMPARISON:  None. FINDINGS: Lower chest: Lung bases demonstrate no acute consolidation or effusion. Differential cardiac blood pool suggesting anemia. Hepatobiliary: No focal liver abnormality is seen. No gallstones, gallbladder wall thickening, or biliary dilatation. Pancreas: Unremarkable. No pancreatic ductal dilatation or surrounding inflammatory changes. Spleen: Borderline to mildly enlarged. Adrenals/Urinary Tract: Adrenal glands are unremarkable. Kidneys are normal, without renal calculi, focal lesion, or hydronephrosis. Bladder is unremarkable. Stomach/Bowel: Postsurgical changes of the stomach consistent with history of gastric sleeve surgery. No dilated small or large bowel. Negative appendix. Vascular/Lymphatic: No significant vascular findings are present. No enlarged abdominal or pelvic lymph nodes. Reproductive: Uterus and bilateral adnexa are unremarkable. Other: Negative for free air or free fluid. Musculoskeletal: No acute or significant osseous findings. IMPRESSION: 1. Negative for acute intra-abdominal or pelvic abnormality. 2. Postsurgical changes of the stomach consistent with history of gastric sleeve surgery. 3. Borderline splenomegaly. Electronically Signed   By: Donavan Foil M.D.   On: 03/18/2020 22:17   DG CHEST PORT 1 VIEW  Result Date:  03/18/2020 CLINICAL DATA:  Recent bariatric surgery, fever, tachycardia, weakness, COVID-19 positive EXAM: PORTABLE CHEST 1 VIEW COMPARISON:  03/18/2020, 12/07/2019 FINDINGS: Single frontal view of the chest demonstrates an unremarkable cardiac silhouette. No airspace disease, effusion, or pneumothorax. No acute bony abnormalities.  IMPRESSION: 1. No acute intrathoracic process. Electronically Signed   By: Randa Ngo M.D.   On: 03/18/2020 23:34   I reviewed her labs from today, hemolysis overall stable, hemoglobin appears to be improving.  All questions were answered. The patient knows to call the clinic with any problems, questions or concerns. I spent 25 minutes in the care of this patient including H and P, review of records, counseling and coordination of care.     Benay Pike, MD 03/20/2020 7:15 PM   ;

## 2020-03-20 NOTE — Progress Notes (Signed)
F/u visit with patient after admission to discuss diet and any other concerns. Patient educated about advancing diet to bari soft.  Reviewed the rules of eating (20/20/20 per Dr. [name]) to ensure patient is able to consume enough protein without complication.   Instructed staff about diet and supplied with protein shakes to give to patient. Will f/u with RD to reschedule post op RD appt for possibly next week.

## 2020-03-20 NOTE — Progress Notes (Signed)
Pt having difficulty with keeping food from bariatric full liquid diet down. Pt feels nausea from Prednisone.   MD Pokhrel and MD [name] made aware via secure chat. Prednisone changed to SoluMedrol and given IV ondansetron.   Also spoke with bariatric dietician Madlyn Frankel [phone] about pt's difficulty with keeping food down. States to encourage pt to focus on "liquids" and can try full liquids tomorrow.   Pt made aware of changes in plan of care. She verbalizes understanding.

## 2020-03-20 NOTE — Progress Notes (Addendum)
PROGRESS NOTE  Cindy Gilbert YTK:[id number] DOB: 07/15/1975 DOA: 03/18/2020 PCP: [name], DO   LOS: 2 days   Brief narrative:  Cindy Gilbert a 45 y.o female with past medical history of anxiety, depression, obesity,  s/p recent laparoscopic sleeve gastrectomy, hyperlipidemia, vitamin D deficiency, multinodular goiter, diabetes presented to hospital with tachycardia weakness. On January 11,  patient undergone laparoscopic sleeve gastrectomy.  Patient did well with her diet and was advised was discharged home on 13 January. On 18 January, patient started 11 cough and fever and was tested positive for Covid.  Patient was seen in clinic by Dr. [name] and was sent to emergency department for further evaluation.  Assessment/Plan:  Active Problems:   Vitamin D deficiency   Controlled substance agreement signed   Anxiety   Multinodular goiter (nontoxic)   Depression   Prediabetes   S/P laparoscopic sleeve gastrectomy   Symptomatic anemia   Elevated bilirubin   COVID-19 virus infection  Symptomatic anemia/Acute hemolytic anemia Oncology on board.  On steroids.  Patient has received 3 units of packed RBC so far.  Latest hemoglobin of 8.9.  Hematology on board.  Will follow recommendations  Elevated bilirubin -Secondary to hemolysis.  Will closely monitor.  Sinus tachycardia -Multifactorial to include dehydration, symptomatic anemia, COVID infection. Has been started on Metoprolol 12.5 mg BID .  Heart rate has improved  Dehydration Improved with hydration   Morbid obesity Status post laparoscopic sleeve gastrectomy .  Will need to follow-up with general surgery as outpatient.  DVT prophylaxis: enoxaparin (LOVENOX) injection 30 mg Start: 03/20/20 2238 Place and maintain sequential compression device Start: 03/19/20 1110   Code Status: Full code  Family Communication: none   Status is: Inpatient  Remains inpatient appropriate because:IV treatments  appropriate due to intensity of illness or inability to take PO, Inpatient level of care appropriate due to severity of illness and Hemolytic anemia   Dispo: The patient is from: Home              Anticipated d/c is to: Home              Anticipated d/c date is: 2 days              Patient currently is not medically stable to d/c.   Difficult to place patient No  Consultants:  Hematooncology  Dr. [name] general surgery  Procedures:  PRBC transfusion  Anti-infectives:  . Remdesivir 1/24>  Anti-infectives (From admission, onward)   Start     Dose/Rate Route Frequency Ordered Stop   03/20/20 1000  remdesivir 100 mg in sodium chloride 0.9 % 100 mL IVPB  Status:  Discontinued       "Followed by" Linked Group Details   100 mg 200 mL/hr over 30 Minutes Intravenous Daily 03/19/20 0210 03/19/20 0222   03/19/20 1000  remdesivir 100 mg in sodium chloride 0.9 % 100 mL IVPB       "Followed by" Linked Group Details   100 mg 200 mL/hr over 30 Minutes Intravenous Daily 03/18/20 2324 03/23/20 0959   03/18/20 2330  remdesivir 200 mg in sodium chloride 0.9% 250 mL IVPB  Status:  Discontinued       "Followed by" Linked Group Details   200 mg 580 mL/hr over 30 Minutes Intravenous Once 03/19/20 0210 03/19/20 0222   03/18/20 2330  remdesivir 200 mg in sodium chloride 0.9% 250 mL IVPB       "Followed by" Linked Group Details   200 mg 580  mL/hr over 30 Minutes Intravenous Once 03/18/20 2324 03/19/20 0040     Subjective: Today, patient was seen and examined at bedside.  Nursing staff reported that the patient had refused metoprolol yesterday and this morning.  Was unable to swallow prednisone down.  Had 1 episode of emesis today and yesterday.  Patient overall feels better but he still has fatigue and weakness.  Objective: Vitals:   03/20/20 0023 03/20/20 0425  BP: 111/71 125/77  Pulse: 93 74  Resp: 18 17  Temp: 98.1 F (36.7 C) 98.6 F (37 C)  SpO2: 100% 99%    Intake/Output Summary  (Last 24 hours) at 03/20/2020 1242 Last data filed at 03/20/2020 0600 Gross per 24 hour  Intake 1478.33 ml  Output --  Net 1478.33 ml   Filed Weights   03/18/20 1607  Weight: 105.2 kg   Body mass index is 41.1 kg/m.   Physical Exam: GENERAL: Patient is alert awake and oriented. Not in obvious distress.  Morbidly obese HENT: No scleral pallor or icterus. Pupils equally reactive to light. Oral mucosa is moist NECK: is supple, no gross swelling noted. CHEST: Clear to auscultation. No crackles or wheezes.  Diminished breath sounds bilaterally. CVS: S1 and S2 heard, no murmur. Regular rate and rhythm.  ABDOMEN: Soft, non-tender, bowel sounds are present.  Laparoscopy scar in place. EXTREMITIES: No edema. CNS: Cranial nerves are intact. No focal motor deficits. SKIN: warm and dry without rashes.  Data Review: I have personally reviewed the following laboratory data and studies,  CBC: Recent Labs  Lab 03/18/20 1621 03/19/20 0518 03/19/20 0822 03/19/20 1839 03/20/20 0036 03/20/20 1115  WBC 13.3* 10.3  --  12.4* 12.5* 12.8*  NEUTROABS 8.9* 5.8  --  8.6* 7.5 7.3  HGB 6.1* 6.1* 6.3* 9.3* 8.2* 8.9*  HCT 19.0* 18.3* 19.1* 27.8* 24.2* 26.3*  MCV 90.9 89.7  --  90.6 90.3 91.3  PLT 308 259  --  287 282 119   Basic Metabolic Panel: Recent Labs  Lab 03/18/20 1621 03/19/20 0518 03/19/20 0822 03/19/20 1839 03/20/20 0036 03/20/20 1115  NA 133* 137  --  138 138 139  K 4.1 3.8  --  3.7 3.6 3.6  CL 98 105  --  104 105 103  CO2 23 23  --  23 23 24   GLUCOSE 112* 99  --  124* 106* 97  BUN 16 13  --  14 14 17   CREATININE 0.78 0.66  --  0.67 0.66 0.78  CALCIUM 9.4 8.4*  --  9.2 8.9 8.9  MG 2.4 2.2  --   --  2.3  --   PHOS  --   --  2.8  --  3.4  --    Liver Function Tests: Recent Labs  Lab 03/18/20 1621 03/19/20 0518 03/19/20 1839 03/20/20 0036 03/20/20 1115  AST 38 30 36 32 36  ALT 22 19 23 20 22   ALKPHOS 61 47 63 50 55  BILITOT 4.3* 3.5* 2.8* 2.7* 3.6*  PROT 7.8 6.3* 7.5  6.1* 6.7  ALBUMIN 4.4 3.4* 4.0 3.4* 3.9   No results for input(s): LIPASE, AMYLASE in the last 168 hours. No results for input(s): AMMONIA in the last 168 hours. Cardiac Enzymes: No results for input(s): CKTOTAL, CKMB, CKMBINDEX, TROPONINI in the last 168 hours. BNP (last 3 results) Recent Labs    03/19/20 0518  BNP 28.3    ProBNP (last 3 results) No results for input(s): PROBNP in the last 8760 hours.  CBG: No  results for input(s): GLUCAP in the last 168 hours. Recent Results (from the past 240 hour(s))  Culture, blood (Routine X 2) w Reflex to ID Panel     Status: None (Preliminary result)   Collection Time: 03/18/20  4:10 PM   Specimen: BLOOD  Result Value Ref Range Status   Specimen Description   Final    BLOOD LEFT ANTECUBITAL Performed at [facility] [address], [place], [place] [address]    Special Requests   Final    BOTTLES DRAWN AEROBIC AND ANAEROBIC Blood Culture results may not be optimal due to an inadequate volume of blood received in culture bottles Performed at [facility] [address] [place], [place] [address]    Culture   Final    NO GROWTH 1 DAY Performed at [facility], [place] [address], [place], [place] [address]    Report Status PENDING  Incomplete  Culture, blood (Routine X 2) w Reflex to ID Panel     Status: None (Preliminary result)   Collection Time: 03/18/20  4:15 PM   Specimen: BLOOD  Result Value Ref Range Status   Specimen Description   Final    BLOOD BLOOD RIGHT HAND Performed at [facility] [address], [place], [place] [address]    Special Requests   Final    BOTTLES DRAWN AEROBIC AND ANAEROBIC Blood Culture results may not be optimal due to an inadequate volume of blood received in culture bottles Performed at [facility] [address] [place], [place] [address]    Culture   Final    NO GROWTH 1 DAY Performed at [facility], [place] [address] [place], [place] [address]    Report Status PENDING  Incomplete  SARS CORONAVIRUS 2 (TAT 6-24 HRS) Nasopharyngeal Nasopharyngeal Swab     Status: Abnormal   Collection Time: 03/18/20  5:22 PM   Specimen: Nasopharyngeal Swab  Result Value Ref Range Status   SARS Coronavirus 2 POSITIVE (A) NEGATIVE Final    Comment: (NOTE) SARS-CoV-2 target nucleic acids are DETECTED.  The SARS-CoV-2 RNA is generally detectable in upper and lower respiratory specimens during the acute phase of infection. Positive results are indicative of the presence of SARS-CoV-2 RNA. Clinical correlation with patient history and other diagnostic information is  necessary to determine patient infection status. Positive results do not rule out bacterial infection or co-infection with other viruses.  The expected result is Negative.  Fact Sheet for Patients: SugarRoll.be  Fact Sheet for Healthcare Providers: https://www.woods-mathews.com/  This test is not yet approved or cleared by the [place] FDA and  has been authorized for detection and/or diagnosis of SARS-CoV-2 by FDA under an Emergency Use Authorization (EUA). This EUA will remain  in effect (meaning this test can be used) for the duration of the COVID-19 declaration under Section 564(b)(1) of the Act, 21 U. S.C. section 360bbb-3(b)(1), unless the authorization is terminated or revoked sooner.   Performed at [facility], [place] [address], [place], [place] [address]   Urine culture     Status: None   Collection Time: 03/18/20  8:40 PM   Specimen: Urine, Random  Result Value Ref Range Status   Specimen Description   Final    URINE, RANDOM Performed at [facility] [address] [place], [place] [address]    Special Requests   Final    NONE Performed at [facility],  [place] [address], [place], [place] [address]    Culture   Final    NO  GROWTH Performed at [facility], [place] [address], [place], [place] [address]    Report Status 03/20/2020 FINAL  Final     Studies: CT Abdomen Pelvis Wo Contrast  Result Date: 03/18/2020 CLINICAL DATA:  Postop gastric sleeve fever EXAM: CT ABDOMEN AND PELVIS WITHOUT CONTRAST TECHNIQUE: Multidetector CT imaging of the abdomen and pelvis was performed following the standard protocol without IV contrast. COMPARISON:  None. FINDINGS: Lower chest: Lung bases demonstrate no acute consolidation or effusion. Differential cardiac blood pool suggesting anemia. Hepatobiliary: No focal liver abnormality is seen. No gallstones, gallbladder wall thickening, or biliary dilatation. Pancreas: Unremarkable. No pancreatic ductal dilatation or surrounding inflammatory changes. Spleen: Borderline to mildly enlarged. Adrenals/Urinary Tract: Adrenal glands are unremarkable. Kidneys are normal, without renal calculi, focal lesion, or hydronephrosis. Bladder is unremarkable. Stomach/Bowel: Postsurgical changes of the stomach consistent with history of gastric sleeve surgery. No dilated small or large bowel. Negative appendix. Vascular/Lymphatic: No significant vascular findings are present. No enlarged abdominal or pelvic lymph nodes. Reproductive: Uterus and bilateral adnexa are unremarkable. Other: Negative for free air or free fluid. Musculoskeletal: No acute or significant osseous findings. IMPRESSION: 1. Negative for acute intra-abdominal or pelvic abnormality. 2. Postsurgical changes of the stomach consistent with history of gastric sleeve surgery. 3. Borderline splenomegaly. Electronically Signed   By: Donavan Foil M.D.   On: 03/18/2020 22:17   DG CHEST PORT 1 VIEW  Result Date: 03/18/2020 CLINICAL DATA:  Recent bariatric surgery, fever, tachycardia, weakness, COVID-19 positive EXAM: PORTABLE CHEST 1 VIEW COMPARISON:  03/18/2020, 12/07/2019 FINDINGS: Single frontal view of the chest demonstrates an unremarkable  cardiac silhouette. No airspace disease, effusion, or pneumothorax. No acute bony abnormalities. IMPRESSION: 1. No acute intrathoracic process. Electronically Signed   By: Randa Ngo M.D.   On: 03/18/2020 23:34      Flora Lipps, MD  [facility] 03/20/2020  If 7PM-7AM, please contact night-coverage

## 2020-03-21 LAB — CBC WITH DIFFERENTIAL/PLATELET
Abs Immature Granulocytes: 0.7 10*3/uL — ABNORMAL HIGH (ref 0.00–0.07)
Basophils Absolute: 0.1 10*3/uL (ref 0.0–0.1)
Basophils Relative: 1 %
Eosinophils Absolute: 0 10*3/uL (ref 0.0–0.5)
Eosinophils Relative: 0 %
HCT: 27.8 % — ABNORMAL LOW (ref 36.0–46.0)
Hemoglobin: 9.1 g/dL — ABNORMAL LOW (ref 12.0–15.0)
Immature Granulocytes: 5 %
Lymphocytes Relative: 30 %
Lymphs Abs: 4 10*3/uL (ref 0.7–4.0)
MCH: 30.4 pg (ref 26.0–34.0)
MCHC: 32.7 g/dL (ref 30.0–36.0)
MCV: 93 fL (ref 80.0–100.0)
Monocytes Absolute: 0.6 10*3/uL (ref 0.1–1.0)
Monocytes Relative: 5 %
Neutro Abs: 8 10*3/uL — ABNORMAL HIGH (ref 1.7–7.7)
Neutrophils Relative %: 59 %
Platelets: 328 10*3/uL (ref 150–400)
RBC: 2.99 MIL/uL — ABNORMAL LOW (ref 3.87–5.11)
RDW: 16.5 % — ABNORMAL HIGH (ref 11.5–15.5)
WBC: 13.4 10*3/uL — ABNORMAL HIGH (ref 4.0–10.5)
nRBC: 0.9 % — ABNORMAL HIGH (ref 0.0–0.2)

## 2020-03-21 LAB — COMPREHENSIVE METABOLIC PANEL
ALT: 29 U/L (ref 0–44)
AST: 37 U/L (ref 15–41)
Albumin: 4 g/dL (ref 3.5–5.0)
Alkaline Phosphatase: 56 U/L (ref 38–126)
Anion gap: 12 (ref 5–15)
BUN: 18 mg/dL (ref 6–20)
CO2: 24 mmol/L (ref 22–32)
Calcium: 9.6 mg/dL (ref 8.9–10.3)
Chloride: 102 mmol/L (ref 98–111)
Creatinine, Ser: 0.8 mg/dL (ref 0.44–1.00)
GFR, Estimated: >60 mL/min (ref >60)
Glucose, Bld: 88 mg/dL (ref 70–99)
Potassium: 3.7 mmol/L (ref 3.5–5.1)
Sodium: 138 mmol/L (ref 135–145)
Total Bilirubin: 3 mg/dL — ABNORMAL HIGH (ref 0.3–1.2)
Total Protein: 7.1 g/dL (ref 6.5–8.1)

## 2020-03-21 LAB — RETICULOCYTES
Immature Retic Fract: 47.2 % — ABNORMAL HIGH (ref 2.3–15.9)
RBC.: 2.94 MIL/uL — ABNORMAL LOW (ref 3.87–5.11)
Retic Count, Absolute: 197.6 10*3/uL — ABNORMAL HIGH (ref 19.0–186.0)
Retic Ct Pct: 6.7 % — ABNORMAL HIGH (ref 0.4–3.1)

## 2020-03-21 LAB — FERRITIN: Ferritin: 1074 ng/mL — ABNORMAL HIGH (ref 11–307)

## 2020-03-21 LAB — PHOSPHORUS: Phosphorus: 4.5 mg/dL (ref 2.5–4.6)

## 2020-03-21 LAB — C-REACTIVE PROTEIN: CRP: 4.2 mg/dL — ABNORMAL HIGH (ref <1.0)

## 2020-03-21 LAB — D-DIMER, QUANTITATIVE: D-Dimer, Quant: 1.93 ug/mL-FEU — ABNORMAL HIGH (ref 0.00–0.50)

## 2020-03-21 LAB — LACTATE DEHYDROGENASE: LDH: 892 U/L — ABNORMAL HIGH (ref 98–192)

## 2020-03-21 LAB — MAGNESIUM: Magnesium: 2.5 mg/dL — ABNORMAL HIGH (ref 1.7–2.4)

## 2020-03-21 MED ORDER — POLYETHYLENE GLYCOL 3350 17 G PO PACK
17.0000 g | PACK | Freq: Every day | ORAL | Status: DC | PRN
  Administered 2020-03-22: 17 g via ORAL
  Filled 2020-03-21: qty 1

## 2020-03-21 MED ORDER — PREDNISONE 20 MG PO TABS
100.0000 mg | ORAL_TABLET | Freq: Every day | ORAL | Status: DC
  Administered 2020-03-22 – 2020-03-23 (×2): 100 mg via ORAL
  Filled 2020-03-21 (×2): qty 5

## 2020-03-21 NOTE — Progress Notes (Signed)
Delayed note entry Pt was seen & examined yesterday  hgb improving with steroid No abd pain Soft, nontender Had some n/v with soup and prednisone later on Wednesday  D/w with nurse - trying a difft shake   D/w patient eating techniques  Discussed with TRH and hem about increasing prophylactic lovenox to bid given recent bari surgery and now with covid  Cindy Gilbert. [name], MD, FACS General, Bariatric, & Minimally Invasive Surgery [facility], [place]

## 2020-03-21 NOTE — Progress Notes (Signed)
   Subjective/Chief Complaint: Feels a lot better No n/v Sitting in chair Tolerated soft eggs Last BM 2 days ago   Objective: Vital signs in last 24 hours: Temp:  [98 F (36.7 C)-98.6 F (37 C)] 98.6 F (37 C) (01/27 0428) Pulse Rate:  [71-95] 71 (01/27 0428) Resp:  [16-18] 17 (01/27 0428) BP: (109-131)/(75-90) 131/78 (01/27 0428) SpO2:  [97 %-100 %] 100 % (01/27 0428) Last BM Date: 03/17/20  Intake/Output from previous day: 01/26 0701 - 01/27 0700 In: 581.8 [P.O.:480; IV Piggyback:101.8] Out: -  Intake/Output this shift: No intake/output data recorded.  Alert, nontoxic Smiling Soft, obese, nt  Lab Results:  Recent Labs    03/20/20 1816 03/21/20 0346  WBC 11.5* 13.4*  HGB 9.8* 9.1*  HCT 31.4* 27.8*  PLT 322 328   BMET Recent Labs    03/20/20 1816 03/21/20 0346  NA 138 138  K 4.4 3.7  CL 105 102  CO2 23 24  GLUCOSE 149* 88  BUN 18 18  CREATININE 0.75 0.80  CALCIUM 9.2 9.6   PT/INR Recent Labs    03/18/20 1621  LABPROT 14.5  INR 1.2   ABG No results for input(s): PHART, HCO3 in the last 72 hours.  Invalid input(s): PCO2, PO2  Studies/Results: No results found.  Anti-infectives: Anti-infectives (From admission, onward)   Start     Dose/Rate Route Frequency Ordered Stop   03/20/20 1000  remdesivir 100 mg in sodium chloride 0.9 % 100 mL IVPB  Status:  Discontinued       "Followed by" Linked Group Details   100 mg 200 mL/hr over 30 Minutes Intravenous Daily 03/19/20 0210 03/19/20 0222   03/19/20 1000  remdesivir 100 mg in sodium chloride 0.9 % 100 mL IVPB       "Followed by" Linked Group Details   100 mg 200 mL/hr over 30 Minutes Intravenous Daily 03/18/20 2324 03/23/20 0959   03/18/20 2330  remdesivir 200 mg in sodium chloride 0.9% 250 mL IVPB  Status:  Discontinued       "Followed by" Linked Group Details   200 mg 580 mL/hr over 30 Minutes Intravenous Once 03/19/20 0210 03/19/20 0222   03/18/20 2330  remdesivir 200 mg in sodium  chloride 0.9% 250 mL IVPB       "Followed by" Linked Group Details   200 mg 580 mL/hr over 30 Minutes Intravenous Once 03/18/20 2324 03/19/20 0040      Assessment/Plan: s/p * No surgery found * S/p sleeve gastrectomy covid + Hemolytic anemia  Steroids for HA per hem/onc  Tolerating PO better rediscussed eating techniques/behaviors  Add prn miralax Cont soft proteins and bari FLD Cont bid lovenox for vte prophylaxis, pt also walking around in room a lot  Will d/w with pharm, hem/onc, TRH about whether or not pt should go out on lovenox  i'm not here Friday. pls call on-call [facility] surgeon if have any questions Appreciate TRH and hem/onc assist  [name], MD, FACS General, Bariatric, & Minimally Invasive Surgery [facility], [place]   LOS: 3 days    [name] 03/21/2020

## 2020-03-21 NOTE — Progress Notes (Addendum)
[facility] PROGRESS NOTE  Patient Care Team: [name], DO as PCP - General (Family Medicine) Berniece Salines, DO as PCP - Cardiology (Cardiology)  CHIEF COMPLAINTS/PURPOSE OF CONSULTATION:  Fatigue  ASSESSMENT & PLAN:  No problem-specific Assessment & Plan notes found for this encounter.  1. Coombs positive autoimmune hemolytic anemia Rare cases of coombs positive AIHA have been reported with COVID 19. She is currently on Solu-Medrol, had some difficulty with oral prednisone.  Hemolysis labs today have been relatively stable, CBC this morning showed a hemoglobin of 9.1 which is stable. If hb greater than 10, then will slowly taper steroids She understands that prednisone can cause delayed wound healing. No concerns today, patient feels tremendously better, we will continue to monitor   2. DVT prophylaxis Given COVID 19 acute infection as well as recent gastric sleeve surgery, it is reasonable to continue lovenox for anticoagulation prophylaxis per Dr. Dois Davenport recommendation.  She is at very high risk of DVT.   Please send CBC, CMP, LDH, retic count daily Folic acid 54m po daily  We will continue to monitor her daily.  HISTORY OF PRESENTING ILLNESS:  Cindy Gilbert 45y.o. female is here because of AIHA  Interim history  She reports that she continues to feel better She is sitting up in the recliner this morning and eating breakfast She is not having any nausea or vomiting today She is able to walk around the room without experiencing any dyspnea She denies any bleeding.  She has some pain over her feet, no swelling, no chest pain. No diarrhea, no change in urination.   Rest of the pertinent review of systems reviewed and unremarkable.  MEDICAL HISTORY:  Past Medical History:  Diagnosis Date  . Allergy    Seasonal  . Anxiety 03/22/2018  . Controlled substance agreement signed 12/18/2016   For xanax  . COVID-19   . Depression    previously on  lexapro, celexa,   . Dyslipidemia   . Heart murmur 12/27/2019  . Hip pain, chronic    Right  . Insomnia   . Morbid obesity ([facility] 10/15/2019  . Multinodular goiter (nontoxic) 03/16/2019   Small nodules- no need for bx or follow up imaging 2020  . Ovarian cyst    left  . Right knee pain   . Vitamin D deficiency     SURGICAL HISTORY: Past Surgical History:  Procedure Laterality Date  . ADENOIDECTOMY    . CESAREAN SECTION  2001  . DILATION AND CURETTAGE OF UTERUS    . LAPAROSCOPIC GASTRIC SLEEVE RESECTION N/A 03/05/2020   Procedure: LAPAROSCOPIC GASTRIC SLEEVE RESECTION;  Surgeon: WGreer Pickerel MD;  Location: [facility];  Service: General;  Laterality: N/A;  . TONSILLECTOMY    . UPPER GI ENDOSCOPY N/A 03/05/2020   Procedure: UPPER GI ENDOSCOPY;  Surgeon: WGreer Pickerel MD;  Location: [facility];  Service: General;  Laterality: N/A;    SOCIAL HISTORY: Social History   Socioeconomic History  . Marital status: Single    Spouse name: Not on file  . Number of children: Not on file  . Years of education: Not on file  . Highest education level: Not on file  Occupational History  . Not on file  Tobacco Use  . Smoking status: Former Smoker    Packs/day: 0.25    Years: 10.00    Pack years: 2.50    Types: Cigarettes    Quit date: 02/23/2014    Years since quitting: 6.0  . Smokeless  tobacco: Never Used  Vaping Use  . Vaping Use: Never used  Substance and Sexual Activity  . Alcohol use: Yes    Alcohol/week: 5.0 standard drinks    Types: 5 Glasses of wine per week  . Drug use: No  . Sexual activity: Not Currently    Birth control/protection: None  Other Topics Concern  . Not on file  Social History Narrative  . Not on file   Social Determinants of Health   Financial Resource Strain: Not on file  Food Insecurity: Not on file  Transportation Needs: Not on file  Physical Activity: Not on file  Stress: Not on file  Social Connections: Not on file  Intimate Partner Violence: Not on  file    FAMILY HISTORY: Family History  Problem Relation Age of Onset  . Heart disease Father   . Stroke Father   . Heart attack Father   . Hypertension Father   . Atrial fibrillation Maternal Aunt   . Cancer Paternal Aunt        Breast  . Heart murmur Maternal Grandmother   . Asthma Paternal Grandmother   . Hypertension Mother   . Hyperlipidemia Mother     ALLERGIES:  is allergic to [place] [zolpidem tartrate], belviq [lorcaserin], bupropion, and cymbalta [duloxetine hcl].  MEDICATIONS:  Current Facility-Administered Medications  Medication Dose Route Frequency Provider Last Rate Last Admin  . 0.9 %  sodium chloride infusion (Manually program via Guardrails IV Fluids)   Intravenous Once Toy Baker, MD   Held at 03/18/20 1936  . 0.9 %  sodium chloride infusion  250 mL Intravenous PRN Doutova, Anastassia, MD      . acetaminophen (TYLENOL) tablet 650 mg  650 mg Oral Q6H PRN Doutova, Anastassia, MD      . diphenhydrAMINE (BENADRYL) injection 12.5 mg  12.5 mg Intravenous Q2H PRN Allie Bossier, MD   12.5 mg at 03/19/20 1303  . enoxaparin (LOVENOX) injection 30 mg  30 mg Subcutaneous Q12H [name], MD   30 mg at 03/20/20 2131  . folic acid (FOLVITE) tablet 1 mg  1 mg Oral Daily Allie Bossier, MD   1 mg at 03/20/20 1037  . guaiFENesin-dextromethorphan (ROBITUSSIN DM) 100-10 MG/5ML syrup 10 mL  10 mL Oral Q4H PRN Doutova, Anastassia, MD      . HYDROcodone-acetaminophen (NORCO/VICODIN) 5-325 MG per tablet 1-2 tablet  1-2 tablet Oral Q4H PRN Doutova, Anastassia, MD      . melatonin tablet 5 mg  5 mg Oral QHS PRN Hollace Hayward K, NP   5 mg at 03/20/20 2130  . methylPREDNISolone sodium succinate (SOLU-MEDROL) 125 mg/2 mL injection 80 mg  80 mg Intravenous Daily Percival Glasheen, MD      . metoprolol tartrate (LOPRESSOR) tablet 12.5 mg  12.5 mg Oral BID Allie Bossier, MD      . ondansetron [facility]) tablet 4 mg  4 mg Oral Q6H PRN Toy Baker, MD       Or  .  ondansetron (ZOFRAN) injection 4 mg  4 mg Intravenous Q6H PRN Toy Baker, MD   4 mg at 03/20/20 1231  . pantoprazole (PROTONIX) EC tablet 40 mg  40 mg Oral Daily Doutova, Anastassia, MD   40 mg at 03/20/20 1038  . protein supplement (ENSURE MAX) liquid  11 oz Oral BID [name], MD   11 oz at 03/20/20 1102  . remdesivir 100 mg in sodium chloride 0.9 % 100 mL IVPB  100 mg Intravenous Daily Doutova, Anastassia,  MD 200 mL/hr at 03/20/20 1103 100 mg at 03/20/20 1103  . sodium chloride flush (NS) 0.9 % injection 3 mL  3 mL Intravenous Q12H Doutova, Anastassia, MD   3 mL at 03/20/20 1039  . sodium chloride flush (NS) 0.9 % injection 3 mL  3 mL Intravenous Q12H Toy Baker, MD   3 mL at 03/20/20 2132  . sodium chloride flush (NS) 0.9 % injection 3 mL  3 mL Intravenous PRN Doutova, Anastassia, MD      . traMADol (ULTRAM) tablet 50 mg  50 mg Oral Q6H PRN Doutova, Anastassia, MD         PHYSICAL EXAMINATION: ECOG PERFORMANCE STATUS: 0 - Asymptomatic  Vitals:   03/20/20 2026 03/21/20 0428  BP: 122/90 131/78  Pulse: 71 71  Resp: 18 17  Temp: 98.4 F (36.9 C) 98.6 F (37 C)  SpO2: 97% 100%   Filed Weights   03/18/20 1607  Weight: 105.2 kg    GENERAL:alert, no distress and comfortable SKIN: No major concerns EYES: normal, conjunctiva are pink and non-injected, sclera clear OROPHARYNX:no exudate, no erythema and lips, buccal mucosa, and tongue normal  LYMPH:  no palpable lymphadenopathy in the cervical, axillary or inguinal LUNGS: clear to auscultation and percussion with normal breathing effort HEART: regular rate & rhythm and no murmurs and no lower extremity edema ABDOMEN:abdomen soft, non-tender and normal bowel sounds Musculoskeletal:no cyanosis of digits and no clubbing  PSYCH: alert & oriented x 3 with fluent speech NEURO: no focal motor/sensory deficits  LABORATORY DATA:  I have reviewed the data as listed Lab Results  Component Value Date   WBC 13.4 (H)  03/21/2020   HGB 9.1 (L) 03/21/2020   HCT 27.8 (L) 03/21/2020   MCV 93.0 03/21/2020   PLT 328 03/21/2020   @LASTCHEM @  RADIOGRAPHIC STUDIES: I have personally reviewed the radiological images as listed and agreed with the findings in the report. CT Abdomen Pelvis Wo Contrast  Result Date: 03/18/2020 CLINICAL DATA:  Postop gastric sleeve fever EXAM: CT ABDOMEN AND PELVIS WITHOUT CONTRAST TECHNIQUE: Multidetector CT imaging of the abdomen and pelvis was performed following the standard protocol without IV contrast. COMPARISON:  None. FINDINGS: Lower chest: Lung bases demonstrate no acute consolidation or effusion. Differential cardiac blood pool suggesting anemia. Hepatobiliary: No focal liver abnormality is seen. No gallstones, gallbladder wall thickening, or biliary dilatation. Pancreas: Unremarkable. No pancreatic ductal dilatation or surrounding inflammatory changes. Spleen: Borderline to mildly enlarged. Adrenals/Urinary Tract: Adrenal glands are unremarkable. Kidneys are normal, without renal calculi, focal lesion, or hydronephrosis. Bladder is unremarkable. Stomach/Bowel: Postsurgical changes of the stomach consistent with history of gastric sleeve surgery. No dilated small or large bowel. Negative appendix. Vascular/Lymphatic: No significant vascular findings are present. No enlarged abdominal or pelvic lymph nodes. Reproductive: Uterus and bilateral adnexa are unremarkable. Other: Negative for free air or free fluid. Musculoskeletal: No acute or significant osseous findings. IMPRESSION: 1. Negative for acute intra-abdominal or pelvic abnormality. 2. Postsurgical changes of the stomach consistent with history of gastric sleeve surgery. 3. Borderline splenomegaly. Electronically Signed   By: Donavan Foil M.D.   On: 03/18/2020 22:17   DG CHEST PORT 1 VIEW  Result Date: 03/18/2020 CLINICAL DATA:  Recent bariatric surgery, fever, tachycardia, weakness, COVID-19 positive EXAM: PORTABLE CHEST 1 VIEW  COMPARISON:  03/18/2020, 12/07/2019 FINDINGS: Single frontal view of the chest demonstrates an unremarkable cardiac silhouette. No airspace disease, effusion, or pneumothorax. No acute bony abnormalities. IMPRESSION: 1. No acute intrathoracic process. Electronically Signed   By:  Randa Ngo M.D.   On: 03/18/2020 23:34   I reviewed her labs from today, hemolysis overall stable, hemoglobin appears to be stable.  All questions were answered. The patient knows to call the clinic with any problems, questions or concerns.    Mikey Bussing, NP 03/21/2020 9:40 AM  Attending Note  I personally saw the patient, reviewed the chart and examined the patient. The plan of care was discussed with the patient and the admitting team. I agree with the assessment and plan as documented above. Thank you very much for the consultation. She is doing very well, wants to try oral prednisone tomorrow in preparation for discharge. She understands that this may take a few days I discussed with Dr [name] and since there is no role for anticoagulation prophylaxis from there stand point, I dont believe she will benefit from this as long as she remains active and ambulatory Labs daily  Tiphanie Vo M.D.

## 2020-03-21 NOTE — Progress Notes (Signed)
PROGRESS NOTE  Cindy Gilbert MGQ:[id number] DOB: 11/11/1975 DOA: 03/18/2020 PCP: [name], DO   LOS: 3 days   Brief narrative:  Cindy Gilbert a 45 y.o female with past medical history of anxiety, depression, obesity,  s/p recent laparoscopic sleeve gastrectomy, hyperlipidemia, vitamin D deficiency, multinodular goiter, diabetes mellitus presented to hospital with tachycardia, weakness. On January 12/2020,  patient undergone laparoscopic sleeve gastrectomy.  Patient did well with her diet and was advised was discharged home on 13th January. On 18th January, patient started having cough and fever and was tested positive for Covid.  Patient was seen in clinic by Dr. [name] and was sent to emergency department for further evaluation.  Patient was then admitted to hospital for significant anemia with Covid infection.  Assessment/Plan:  Active Problems:   Vitamin D deficiency   Controlled substance agreement signed   Anxiety   Multinodular goiter (nontoxic)   Depression   Prediabetes   S/P laparoscopic sleeve gastrectomy   Symptomatic anemia   Elevated bilirubin   COVID-19 virus infection  Symptomatic anemia secondary to acute Coombs positive hemolytic anemia Oncology on board.  On steroids with IV Solu-Medrol..  Patient has received 3 units of packed RBC so far.  Latest hemoglobin of 9.1.  Has remained stable.  Hematology on board.  Will follow further recommendations.  Inflammatory markers trending down.  Oncology recommending hemoglobin more than 10 and stability prior to discharge with tapering of steroids.  CBC Latest Ref Rng & Units 03/21/2020 03/20/2020 03/20/2020  WBC 4.0 - 10.5 K/uL 13.4(H) 11.5(H) 12.8(H)  Hemoglobin 12.0 - 15.0 g/dL 9.1(L) 9.8(L) 8.9(L)  Hematocrit 36.0 - 46.0 % 27.8(L) 31.4(L) 26.3(L)  Platelets 150 - 400 K/uL 328 322 275    COVID-19 Labs  Recent Labs    03/19/20 0518 03/19/20 1839 03/20/20 0036 03/20/20 1115 03/20/20 1816  03/21/20 0346  DDIMER 4.57*  --  3.09*  --   --  1.93*  FERRITIN 1,166*  --  893*  --   --  1,074*  LDH  --    < > 738* 746* 1,059* 892*  CRP 4.9*  --  4.3*  --   --  4.2*   < > = values in this interval not displayed.    Lab Results  Component Value Date   [place] (A) 03/18/2020   [place] NEGATIVE 03/01/2020   [place] Not Detected 09/15/2018    Sinus tachycardia -Multifactorial to include dehydration, symptomatic anemia, COVID infection. Has been started on Metoprolol 12.5 mg BID .  Heart rate has normalized at this time  Dehydration Improved with hydration   Morbid obesity Status post laparoscopic sleeve gastrectomy .  Will need to follow-up with general surgery as outpatient.  Bariatric diet has been advised due to nausea and vomiting yesterday  DVT prophylaxis: enoxaparin (LOVENOX) injection 30 mg Start: 03/20/20 2238 Place and maintain sequential compression device Start: 03/19/20 1110   Code Status: Full code  Family Communication: None, I spoke with the patient at bedside  Status is: Inpatient  Remains inpatient appropriate because:IV treatments appropriate due to intensity of illness or inability to take PO, Inpatient level of care appropriate due to severity of illness and Hemolytic anemia, follow surgical and oncology recommendation  Dispo: The patient is from: Home              Anticipated d/c is to: Home              Anticipated d/c date is: 2 days or so when  hemoglobin is more than 10.              Patient currently is not medically stable to d/c.   Difficult to place patient No  Consultants:  Hematooncology  General surgery  Procedures:  PRBC transfusion  Anti-infectives:   Remdesivir 1/24>  Anti-infectives (From admission, onward)   Start     Dose/Rate Route Frequency Ordered Stop   03/20/20 1000  remdesivir 100 mg in sodium chloride 0.9 % 100 mL IVPB  Status:  Discontinued       "Followed by" Linked Group Details    100 mg 200 mL/hr over 30 Minutes Intravenous Daily 03/19/20 0210 03/19/20 0222   03/19/20 1000  remdesivir 100 mg in sodium chloride 0.9 % 100 mL IVPB       "Followed by" Linked Group Details   100 mg 200 mL/hr over 30 Minutes Intravenous Daily 03/18/20 2324 03/23/20 0959   03/18/20 2330  remdesivir 200 mg in sodium chloride 0.9% 250 mL IVPB  Status:  Discontinued       "Followed by" Linked Group Details   200 mg 580 mL/hr over 30 Minutes Intravenous Once 03/19/20 0210 03/19/20 0222   03/18/20 2330  remdesivir 200 mg in sodium chloride 0.9% 250 mL IVPB       "Followed by" Linked Group Details   200 mg 580 mL/hr over 30 Minutes Intravenous Once 03/18/20 2324 03/19/20 0040     Subjective: Today, patient was seen and examined at bedside.  Continues to feel better in terms of energy.  No shortness of breath, chest pain, dizziness lightheadedness.  No nausea vomiting or abdominal pain.  Last bowel movement 2 days ago   Objective: Vitals:   03/20/20 2026 03/21/20 0428  BP: 122/90 131/78  Pulse: 71 71  Resp: 18 17  Temp: 98.4 F (36.9 C) 98.6 F (37 C)  SpO2: 97% 100%    Intake/Output Summary (Last 24 hours) at 03/21/2020 0802 Last data filed at 03/21/2020 0600 Gross per 24 hour  Intake 581.79 ml  Output --  Net 581.79 ml   Filed Weights   03/18/20 1607  Weight: 105.2 kg   Body mass index is 41.1 kg/m.   Physical Exam: GENERAL: Patient is alert awake and oriented. Not in obvious distress.  Morbidly obese HENT: Mild pallor noted, pupils equally reactive to light. Oral mucosa is moist NECK: is supple, no gross swelling noted. CHEST: Clear to auscultation. No crackles or wheezes.  Diminished breath sounds bilaterally. CVS: S1 and S2 heard, no murmur. Regular rate and rhythm.  ABDOMEN: Soft, non-tender, bowel sounds are present.  Laparoscopy scar healthy EXTREMITIES: No edema. CNS: Cranial nerves are intact. No focal motor deficits. SKIN: warm and dry without rashes.  Data  Review: I have personally reviewed the following laboratory data and studies,  CBC: Recent Labs  Lab 03/19/20 1839 03/20/20 0036 03/20/20 1115 03/20/20 1816 03/21/20 0346  WBC 12.4* 12.5* 12.8* 11.5* 13.4*  NEUTROABS 8.6* 7.5 7.3 8.1* 8.0*  HGB 9.3* 8.2* 8.9* 9.8* 9.1*  HCT 27.8* 24.2* 26.3* 31.4* 27.8*  MCV 90.6 90.3 91.3 99.1 93.0  PLT 287 282 275 322 924   Basic Metabolic Panel: Recent Labs  Lab 03/18/20 1621 03/19/20 0518 03/19/20 0822 03/19/20 1839 03/20/20 0036 03/20/20 1115 03/20/20 1816 03/21/20 0346  NA 133* 137  --  138 138 139 138 138  K 4.1 3.8  --  3.7 3.6 3.6 4.4 3.7  CL 98 105  --  104 105 103 105  102  CO2 23 23  --  23 23 24 23 24   GLUCOSE 112* 99  --  124* 106* 97 149* 88  BUN 16 13  --  14 14 17 18 18   CREATININE 0.78 0.66  --  0.67 0.66 0.78 0.75 0.80  CALCIUM 9.4 8.4*  --  9.2 8.9 8.9 9.2 9.6  MG 2.4 2.2  --   --  2.3  --   --  2.5*  PHOS  --   --  2.8  --  3.4  --   --  4.5   Liver Function Tests: Recent Labs  Lab 03/19/20 1839 03/20/20 0036 03/20/20 1115 03/20/20 1816 03/21/20 0346  AST 36 32 36 46* 37  ALT 23 20 22 30 29   ALKPHOS 63 50 55 60 56  BILITOT 2.8* 2.7* 3.6* 2.5* 3.0*  PROT 7.5 6.1* 6.7 7.7 7.1  ALBUMIN 4.0 3.4* 3.9 4.3 4.0   No results for input(s): LIPASE, AMYLASE in the last 168 hours. No results for input(s): AMMONIA in the last 168 hours. Cardiac Enzymes: No results for input(s): CKTOTAL, CKMB, CKMBINDEX, TROPONINI in the last 168 hours. BNP (last 3 results) Recent Labs    03/19/20 0518  BNP 28.3    ProBNP (last 3 results) No results for input(s): PROBNP in the last 8760 hours.  CBG: No results for input(s): GLUCAP in the last 168 hours. Recent Results (from the past 240 hour(s))  Culture, blood (Routine X 2) w Reflex to ID Panel     Status: None (Preliminary result)   Collection Time: 03/18/20  4:10 PM   Specimen: BLOOD  Result Value Ref Range Status   Specimen Description   Final    BLOOD LEFT  ANTECUBITAL Performed at [facility] [address] [place], [place] [address]    Special Requests   Final    BOTTLES DRAWN AEROBIC AND ANAEROBIC Blood Culture results may not be optimal due to an inadequate volume of blood received in culture bottles Performed at [facility] [address] [place], [place] [address]    Culture   Final    NO GROWTH 1 DAY Performed at [facility], [place] [address], [place], [place] [address]    Report Status PENDING  Incomplete  Culture, blood (Routine X 2) w Reflex to ID Panel     Status: None (Preliminary result)   Collection Time: 03/18/20  4:15 PM   Specimen: BLOOD  Result Value Ref Range Status   Specimen Description   Final    BLOOD BLOOD RIGHT HAND Performed at [facility] [address] [place], [place] [address]    Special Requests   Final    BOTTLES DRAWN AEROBIC AND ANAEROBIC Blood Culture results may not be optimal due to an inadequate volume of blood received in culture bottles Performed at [facility] [address], [place], [place] [address]    Culture   Final    NO GROWTH 1 DAY Performed at [facility], [place] [address], [place], [place] [address]    Report Status PENDING  Incomplete  SARS CORONAVIRUS 2 (TAT 6-24 HRS) Nasopharyngeal Nasopharyngeal Swab     Status: Abnormal   Collection Time: 03/18/20  5:22 PM   Specimen: Nasopharyngeal Swab  Result Value Ref Range Status   SARS Coronavirus 2 POSITIVE (A) NEGATIVE Final    Comment: (NOTE) SARS-CoV-2 target nucleic acids are DETECTED.  The SARS-CoV-2 RNA is  generally detectable in upper and lower respiratory specimens during the acute phase of infection. Positive results are indicative of the presence of SARS-CoV-2 RNA. Clinical correlation with patient history and other diagnostic information is  necessary to determine patient infection status. Positive results do not rule out  bacterial infection or co-infection with other viruses.  The expected result is Negative.  Fact Sheet for Patients: SugarRoll.be  Fact Sheet for Healthcare Providers: https://www.woods-mathews.com/  This test is not yet approved or cleared by the [place] FDA and  has been authorized for detection and/or diagnosis of SARS-CoV-2 by FDA under an Emergency Use Authorization (EUA). This EUA will remain  in effect (meaning this test can be used) for the duration of the COVID-19 declaration under Section 564(b)(1) of the Act, 21 U. S.C. section 360bbb-3(b)(1), unless the authorization is terminated or revoked sooner.   Performed at [facility], [place] [address], [place], [place] [address]   Urine culture     Status: None   Collection Time: 03/18/20  8:40 PM   Specimen: Urine, Random  Result Value Ref Range Status   Specimen Description   Final    URINE, RANDOM Performed at [facility] [address] [place], [place] [address]    Special Requests   Final    NONE Performed at [facility], [place] [address] [place], [place] [address]    Culture   Final    NO GROWTH Performed at [facility], [place] [address], [place], [place] [address]    Report Status 03/20/2020 FINAL  Final     Studies: No results found.    Flora Lipps, MD  [facility] 03/21/2020  If 7PM-7AM, please contact night-coverage

## 2020-03-22 LAB — CBC WITH DIFFERENTIAL/PLATELET
Abs Immature Granulocytes: 0.37 10*3/uL — ABNORMAL HIGH (ref 0.00–0.07)
Basophils Absolute: 0 10*3/uL (ref 0.0–0.1)
Basophils Relative: 0 %
Eosinophils Absolute: 0 10*3/uL (ref 0.0–0.5)
Eosinophils Relative: 0 %
HCT: 26.7 % — ABNORMAL LOW (ref 36.0–46.0)
Hemoglobin: 8.7 g/dL — ABNORMAL LOW (ref 12.0–15.0)
Immature Granulocytes: 4 %
Lymphocytes Relative: 35 %
Lymphs Abs: 3.5 10*3/uL (ref 0.7–4.0)
MCH: 31.4 pg (ref 26.0–34.0)
MCHC: 32.6 g/dL (ref 30.0–36.0)
MCV: 96.4 fL (ref 80.0–100.0)
Monocytes Absolute: 0.5 10*3/uL (ref 0.1–1.0)
Monocytes Relative: 5 %
Neutro Abs: 5.7 10*3/uL (ref 1.7–7.7)
Neutrophils Relative %: 56 %
Platelets: 310 10*3/uL (ref 150–400)
RBC: 2.77 MIL/uL — ABNORMAL LOW (ref 3.87–5.11)
RDW: 17.2 % — ABNORMAL HIGH (ref 11.5–15.5)
WBC: 10.1 10*3/uL (ref 4.0–10.5)
nRBC: 0.5 % — ABNORMAL HIGH (ref 0.0–0.2)

## 2020-03-22 LAB — COMPREHENSIVE METABOLIC PANEL
ALT: 31 U/L (ref 0–44)
AST: 32 U/L (ref 15–41)
Albumin: 4 g/dL (ref 3.5–5.0)
Alkaline Phosphatase: 55 U/L (ref 38–126)
Anion gap: 9 (ref 5–15)
BUN: 19 mg/dL (ref 6–20)
CO2: 25 mmol/L (ref 22–32)
Calcium: 9.3 mg/dL (ref 8.9–10.3)
Chloride: 105 mmol/L (ref 98–111)
Creatinine, Ser: 0.89 mg/dL (ref 0.44–1.00)
GFR, Estimated: >60 mL/min (ref >60)
Glucose, Bld: 85 mg/dL (ref 70–99)
Potassium: 3.9 mmol/L (ref 3.5–5.1)
Sodium: 139 mmol/L (ref 135–145)
Total Bilirubin: 2.2 mg/dL — ABNORMAL HIGH (ref 0.3–1.2)
Total Protein: 7 g/dL (ref 6.5–8.1)

## 2020-03-22 LAB — PHOSPHORUS: Phosphorus: 4.5 mg/dL (ref 2.5–4.6)

## 2020-03-22 LAB — RETICULOCYTES
Immature Retic Fract: 39.3 % — ABNORMAL HIGH (ref 2.3–15.9)
RBC.: 2.76 MIL/uL — ABNORMAL LOW (ref 3.87–5.11)
Retic Count, Absolute: 231 10*3/uL — ABNORMAL HIGH (ref 19.0–186.0)
Retic Ct Pct: 8.4 % — ABNORMAL HIGH (ref 0.4–3.1)

## 2020-03-22 LAB — D-DIMER, QUANTITATIVE: D-Dimer, Quant: 1.17 ug/mL-FEU — ABNORMAL HIGH (ref 0.00–0.50)

## 2020-03-22 LAB — C-REACTIVE PROTEIN: CRP: 3.5 mg/dL — ABNORMAL HIGH (ref <1.0)

## 2020-03-22 LAB — MAGNESIUM: Magnesium: 2.5 mg/dL — ABNORMAL HIGH (ref 1.7–2.4)

## 2020-03-22 LAB — LACTATE DEHYDROGENASE: LDH: 752 U/L — ABNORMAL HIGH (ref 98–192)

## 2020-03-22 LAB — FERRITIN: Ferritin: 923 ng/mL — ABNORMAL HIGH (ref 11–307)

## 2020-03-22 MED ORDER — TAB-A-VITE/IRON PO TABS
1.0000 | ORAL_TABLET | Freq: Every day | ORAL | Status: DC
  Administered 2020-03-22 – 2020-03-23 (×2): 1 via ORAL
  Filled 2020-03-22 (×2): qty 1

## 2020-03-22 NOTE — Progress Notes (Addendum)
   Subjective/Chief Complaint: Feels good No n/v Sitting up in bed, breakfast just arrived No abd pain Reports liquids going well Walking in room frequently   Objective: Vital signs in last 24 hours: Temp:  [98 F (36.7 C)-98.1 F (36.7 C)] 98 F (36.7 C) (01/28 0503) Pulse Rate:  [67-88] 67 (01/28 0503) Resp:  [16-18] 18 (01/28 0503) BP: (115-127)/(72-87) 127/82 (01/28 0503) SpO2:  [99 %] 99 % (01/28 0503) Last BM Date: 03/17/20  Intake/Output from previous day: No intake/output data recorded. Intake/Output this shift: No intake/output data recorded.  Alert, nontoxic Smiling Soft, obese, nt  Lab Results:  Recent Labs    03/21/20 0346 03/22/20 0356  WBC 13.4* 10.1  HGB 9.1* 8.7*  HCT 27.8* 26.7*  PLT 328 310   BMET Recent Labs    03/21/20 0346 03/22/20 0356  NA 138 139  K 3.7 3.9  CL 102 105  CO2 24 25  GLUCOSE 88 85  BUN 18 19  CREATININE 0.80 0.89  CALCIUM 9.6 9.3   PT/INR No results for input(s): LABPROT, INR in the last 72 hours. ABG No results for input(s): PHART, HCO3 in the last 72 hours.  Invalid input(s): PCO2, PO2  Studies/Results: No results found.  Anti-infectives: Anti-infectives (From admission, onward)   Start     Dose/Rate Route Frequency Ordered Stop   03/20/20 1000  remdesivir 100 mg in sodium chloride 0.9 % 100 mL IVPB  Status:  Discontinued       "Followed by" Linked Group Details   100 mg 200 mL/hr over 30 Minutes Intravenous Daily 03/19/20 0210 03/19/20 0222   03/19/20 1000  remdesivir 100 mg in sodium chloride 0.9 % 100 mL IVPB       "Followed by" Linked Group Details   100 mg 200 mL/hr over 30 Minutes Intravenous Daily 03/18/20 2324 03/23/20 0959   03/18/20 2330  remdesivir 200 mg in sodium chloride 0.9% 250 mL IVPB  Status:  Discontinued       "Followed by" Linked Group Details   200 mg 580 mL/hr over 30 Minutes Intravenous Once 03/19/20 0210 03/19/20 0222   03/18/20 2330  remdesivir 200 mg in sodium chloride  0.9% 250 mL IVPB       "Followed by" Linked Group Details   200 mg 580 mL/hr over 30 Minutes Intravenous Once 03/18/20 2324 03/19/20 0040      Assessment/Plan: s/p * No surgery found * S/p sleeve gastrectomy covid + Hemolytic anemia  Steroids for HA per hem/onc; hgb drifted down a little today  Tolerating PO  Cont prn miralax for constipation Will add mvi (part of routine post bari sx care) Cont soft proteins (eggs, boiled egg)  and bari FLD, shakes Cont bid lovenox for vte prophylaxis, pt also walking around in room a lot  Doing well from sleeve gastrectomy standpoint  I will not plan on the on-call [facility] team see her this weekend. pls call on-call [facility] surgeon if have any questions/develop fever, new tachycardia Appreciate TRH and hem/onc assist  Cindy Ruff. [name], MD, FACS General, Bariatric, & Minimally Invasive Surgery [facility], [place]   LOS: 4 days    Cindy Gilbert 03/22/2020

## 2020-03-22 NOTE — Progress Notes (Signed)
[facility] PROGRESS NOTE  Patient Care Team: [name], DO as PCP - General (Family Medicine) Berniece Salines, DO as PCP - Cardiology (Cardiology)  CHIEF COMPLAINTS/PURPOSE OF CONSULTATION:  Fatigue  ASSESSMENT & PLAN:  No problem-specific Assessment & Plan notes found for this encounter.  1. Coombs positive autoimmune hemolytic anemia Rare cases of coombs positive AIHA have been reported with COVID 19. She is currently on prednisone, Hb wavering between 8-9 T bili better, LDH better, retic count slightly worse. If hb greater than 10, then will consider DC and will taper steroids and labs outpatient. She understands that prednisone can cause delayed wound healing. No concerns today, patient feels better but disappointed that she cant go home yet.   2. DVT prophylaxis Given COVID 19 acute infection as well as recent gastric sleeve surgery, it is reasonable to continue lovenox for anticoagulation prophylaxis while inpatient. She can be discharged without anticoagulation since she is active.   Please send CBC, CMP, LDH, retic count daily Folic acid 34m po daily  We will continue to monitor her closely  HISTORY OF PRESENTING ILLNESS:  Cindy Gilbert 45y.o. female is here because of AIHA  Interim history  She reports that she continues to feel better but disappointed at the mixed results today She tolerated oral prednisone well, no major issues. She is able to walk around the room without experiencing any dyspnea She denies any bleeding.  She has some pain over her feet, no swelling, no chest pain. No diarrhea, urine is getting lighter. Rest of the pertinent review of systems reviewed and unremarkable.  MEDICAL HISTORY:  Past Medical History:  Diagnosis Date  . Allergy    Seasonal  . Anxiety 03/22/2018  . Controlled substance agreement signed 12/18/2016   For xanax  . COVID-19   . Depression    previously on lexapro, celexa,   . Dyslipidemia   .  Heart murmur 12/27/2019  . Hip pain, chronic    Right  . Insomnia   . Morbid obesity ([place] 10/15/2019  . Multinodular goiter (nontoxic) 03/16/2019   Small nodules- no need for bx or follow up imaging 2020  . Ovarian cyst    left  . Right knee pain   . Vitamin D deficiency     SURGICAL HISTORY: Past Surgical History:  Procedure Laterality Date  . ADENOIDECTOMY    . CESAREAN SECTION  2001  . DILATION AND CURETTAGE OF UTERUS    . LAPAROSCOPIC GASTRIC SLEEVE RESECTION N/A 03/05/2020   Procedure: LAPAROSCOPIC GASTRIC SLEEVE RESECTION;  Surgeon: WGreer Pickerel MD;  Location: [facility];  Service: General;  Laterality: N/A;  . TONSILLECTOMY    . UPPER GI ENDOSCOPY N/A 03/05/2020   Procedure: UPPER GI ENDOSCOPY;  Surgeon: WGreer Pickerel MD;  Location: [facility];  Service: General;  Laterality: N/A;    SOCIAL HISTORY: Social History   Socioeconomic History  . Marital status: Single    Spouse name: Not on file  . Number of children: Not on file  . Years of education: Not on file  . Highest education level: Not on file  Occupational History  . Not on file  Tobacco Use  . Smoking status: Former Smoker    Packs/day: 0.25    Years: 10.00    Pack years: 2.50    Types: Cigarettes    Quit date: 02/23/2014    Years since quitting: 6.0  . Smokeless tobacco: Never Used  Vaping Use  . Vaping Use: Never used  Substance and Sexual Activity  . Alcohol use: Yes    Alcohol/week: 5.0 standard drinks    Types: 5 Glasses of wine per week  . Drug use: No  . Sexual activity: Not Currently    Birth control/protection: None  Other Topics Concern  . Not on file  Social History Narrative  . Not on file   Social Determinants of Health   Financial Resource Strain: Not on file  Food Insecurity: Not on file  Transportation Needs: Not on file  Physical Activity: Not on file  Stress: Not on file  Social Connections: Not on file  Intimate Partner Violence: Not on file    FAMILY HISTORY: Family History   Problem Relation Age of Onset  . Heart disease Father   . Stroke Father   . Heart attack Father   . Hypertension Father   . Atrial fibrillation Maternal Aunt   . Cancer Paternal Aunt        Breast  . Heart murmur Maternal Grandmother   . Asthma Paternal Grandmother   . Hypertension Mother   . Hyperlipidemia Mother     ALLERGIES:  is allergic to [place] [zolpidem tartrate], belviq [lorcaserin], bupropion, and cymbalta [duloxetine hcl].  MEDICATIONS:  Current Facility-Administered Medications  Medication Dose Route Frequency Provider Last Rate Last Admin  . 0.9 %  sodium chloride infusion (Manually program via Guardrails IV Fluids)   Intravenous Once Toy Baker, MD   Held at 03/18/20 1936  . 0.9 %  sodium chloride infusion  250 mL Intravenous PRN Doutova, Anastassia, MD      . acetaminophen (TYLENOL) tablet 650 mg  650 mg Oral Q6H PRN Doutova, Anastassia, MD      . diphenhydrAMINE (BENADRYL) injection 12.5 mg  12.5 mg Intravenous Q2H PRN Allie Bossier, MD   12.5 mg at 03/21/20 1140  . enoxaparin (LOVENOX) injection 30 mg  30 mg Subcutaneous Q12H [name], MD   30 mg at 03/22/20 1018  . folic acid (FOLVITE) tablet 1 mg  1 mg Oral Daily Allie Bossier, MD   1 mg at 03/22/20 1020  . guaiFENesin-dextromethorphan (ROBITUSSIN DM) 100-10 MG/5ML syrup 10 mL  10 mL Oral Q4H PRN Doutova, Anastassia, MD      . HYDROcodone-acetaminophen (NORCO/VICODIN) 5-325 MG per tablet 1-2 tablet  1-2 tablet Oral Q4H PRN Doutova, Anastassia, MD      . melatonin tablet 5 mg  5 mg Oral QHS PRN Hollace Hayward K, NP   5 mg at 03/21/20 2216  . metoprolol tartrate (LOPRESSOR) tablet 12.5 mg  12.5 mg Oral BID Allie Bossier, MD      . multivitamins with iron tablet 1 tablet  1 tablet Oral Daily [name], MD   1 tablet at 03/22/20 1131  . ondansetron (ZOFRAN) tablet 4 mg  4 mg Oral Q6H PRN Doutova, Anastassia, MD       Or  . ondansetron (ZOFRAN) injection 4 mg  4 mg Intravenous Q6H PRN Doutova,  Anastassia, MD   4 mg at 03/20/20 1231  . pantoprazole (PROTONIX) EC tablet 40 mg  40 mg Oral Daily Doutova, Anastassia, MD   40 mg at 03/22/20 1019  . polyethylene glycol (MIRALAX / GLYCOLAX) packet 17 g  17 g Oral Daily PRN [name], MD   17 g at 03/22/20 1131  . predniSONE (DELTASONE) tablet 100 mg  100 mg Oral Q breakfast Yani Coventry, MD   100 mg at 03/22/20 1131  . protein supplement (ENSURE MAX) liquid  11 oz Oral BID [name], MD   11 oz at 03/20/20 1102  . sodium chloride flush (NS) 0.9 % injection 3 mL  3 mL Intravenous Q12H Doutova, Anastassia, MD   3 mL at 03/22/20 1024  . sodium chloride flush (NS) 0.9 % injection 3 mL  3 mL Intravenous Q12H Doutova, Anastassia, MD   3 mL at 03/22/20 1023  . sodium chloride flush (NS) 0.9 % injection 3 mL  3 mL Intravenous PRN Doutova, Anastassia, MD      . traMADol (ULTRAM) tablet 50 mg  50 mg Oral Q6H PRN Doutova, Anastassia, MD         PHYSICAL EXAMINATION: ECOG PERFORMANCE STATUS: 0 - Asymptomatic  Vitals:   03/22/20 0503 03/22/20 1456  BP: 127/82 125/73  Pulse: 67 99  Resp: 18 18  Temp: 98 F (36.7 C) 98.6 F (37 C)  SpO2: 99% 99%   Filed Weights   03/18/20 1607  Weight: 232 lb (105.2 kg)    GENERAL:alert, no distress and comfortable SKIN: No major concerns EYES: normal, conjunctiva are pink and non-injected, sclera clear OROPHARYNX:no exudate, no erythema and lips, buccal mucosa, and tongue normal  LYMPH:  no palpable lymphadenopathy in the cervical, axillary or inguinal LUNGS: clear to auscultation and percussion with normal breathing effort HEART: regular rate & rhythm and no murmurs and no lower extremity edema ABDOMEN:abdomen soft, non-tender and normal bowel sounds Musculoskeletal:no cyanosis of digits and no clubbing  PSYCH: alert & oriented x 3 with fluent speech NEURO: no focal motor/sensory deficits  LABORATORY DATA:  I have reviewed the data as listed Lab Results  Component Value Date   WBC 10.1  03/22/2020   HGB 8.7 (L) 03/22/2020   HCT 26.7 (L) 03/22/2020   MCV 96.4 03/22/2020   PLT 310 03/22/2020   @LASTCHEM @  RADIOGRAPHIC STUDIES: I have personally reviewed the radiological images as listed and agreed with the findings in the report. CT Abdomen Pelvis Wo Contrast  Result Date: 03/18/2020 CLINICAL DATA:  Postop gastric sleeve fever EXAM: CT ABDOMEN AND PELVIS WITHOUT CONTRAST TECHNIQUE: Multidetector CT imaging of the abdomen and pelvis was performed following the standard protocol without IV contrast. COMPARISON:  None. FINDINGS: Lower chest: Lung bases demonstrate no acute consolidation or effusion. Differential cardiac blood pool suggesting anemia. Hepatobiliary: No focal liver abnormality is seen. No gallstones, gallbladder wall thickening, or biliary dilatation. Pancreas: Unremarkable. No pancreatic ductal dilatation or surrounding inflammatory changes. Spleen: Borderline to mildly enlarged. Adrenals/Urinary Tract: Adrenal glands are unremarkable. Kidneys are normal, without renal calculi, focal lesion, or hydronephrosis. Bladder is unremarkable. Stomach/Bowel: Postsurgical changes of the stomach consistent with history of gastric sleeve surgery. No dilated small or large bowel. Negative appendix. Vascular/Lymphatic: No significant vascular findings are present. No enlarged abdominal or pelvic lymph nodes. Reproductive: Uterus and bilateral adnexa are unremarkable. Other: Negative for free air or free fluid. Musculoskeletal: No acute or significant osseous findings. IMPRESSION: 1. Negative for acute intra-abdominal or pelvic abnormality. 2. Postsurgical changes of the stomach consistent with history of gastric sleeve surgery. 3. Borderline splenomegaly. Electronically Signed   By: Donavan Foil M.D.   On: 03/18/2020 22:17   DG CHEST PORT 1 VIEW  Result Date: 03/18/2020 CLINICAL DATA:  Recent bariatric surgery, fever, tachycardia, weakness, COVID-19 positive EXAM: PORTABLE CHEST 1 VIEW  COMPARISON:  03/18/2020, 12/07/2019 FINDINGS: Single frontal view of the chest demonstrates an unremarkable cardiac silhouette. No airspace disease, effusion, or pneumothorax. No acute bony abnormalities. IMPRESSION: 1. No acute intrathoracic process. Electronically  Signed   By: Randa Ngo M.D.   On: 03/18/2020 23:34   I reviewed her labs from today,  [place] Laurette Villescas M.D.

## 2020-03-22 NOTE — Progress Notes (Signed)
PROGRESS NOTE  Cindy Gilbert BHA:[id number] DOB: 02/20/1976 DOA: 03/18/2020 PCP: [name], DO   LOS: 4 days   Brief narrative:  Cindy Gilbert a 45 y.o female with past medical history of anxiety, depression, obesity,  s/p recent laparoscopic sleeve gastrectomy, hyperlipidemia, vitamin D deficiency, multinodular goiter, diabetes mellitus presented to hospital with tachycardia, weakness. On January 12/2020,  patient undergone laparoscopic sleeve gastrectomy.  Patient did well with her diet and was advised was discharged home on 13th January. On 18th January, patient started having cough and fever and was tested positive for Covid.  Patient was seen in clinic by Dr. [name] and was sent to emergency department for further evaluation.  Patient was then admitted to hospital for significant anemia with Covid infection.  Assessment/Plan:  Active Problems:   Vitamin D deficiency   Controlled substance agreement signed   Anxiety   Multinodular goiter (nontoxic)   Depression   Prediabetes   S/P laparoscopic sleeve gastrectomy   Symptomatic anemia   Elevated bilirubin   COVID-19 virus infection  Symptomatic anemia secondary to acute coombs positive hemolytic anemia Thought to be secondary to Covid infection.  Status post 3 units of packed RBC and IV steroids.  Been changed to oral prednisone from today.  Hematology on board, recommending hemoglobin more than 10 and stability prior to discharge with tapering of steroids.  Will discontinue chemistry lab draws for conserving blood.  CBC Latest Ref Rng & Units 03/22/2020 03/21/2020 03/20/2020  WBC 4.0 - 10.5 K/uL 10.1 13.4(H) 11.5(H)  Hemoglobin 12.0 - 15.0 g/dL 8.7(L) 9.1(L) 9.8(L)  Hematocrit 36.0 - 46.0 % 26.7(L) 27.8(L) 31.4(L)  Platelets 150 - 400 K/uL 310 328 322    COVID-19 Labs  Recent Labs    03/20/20 0036 03/20/20 1115 03/20/20 1816 03/21/20 0346 03/22/20 0356  DDIMER 3.09*  --   --  1.93* 1.17*  FERRITIN 893*   --   --  1,074* 923*  LDH 738*   < > 1,059* 892* 752*  CRP 4.3*  --   --  4.2* 3.5*   < > = values in this interval not displayed.    Lab Results  Component Value Date   [place] (A) 03/18/2020   [place] NEGATIVE 03/01/2020   [place] Not Detected 09/15/2018    Sinus tachycardia -Proved at this time.  On metoprolol 12.5 twice daily.  Thought to be secondary to multifactorial to include dehydration, symptomatic anemia, COVID infection.   Dehydration Improved with hydration.  Encourage oral.  Received packed RBC.  Morbid obesity Status post laparoscopic sleeve gastrectomy .  Will need to follow-up with general surgery as outpatient.  Dietary instructions as per surgery.  DVT prophylaxis: enoxaparin (LOVENOX) injection 30 mg Start: 03/20/20 2238 Place and maintain sequential compression device Start: 03/19/20 1110   Code Status: Full code  Family Communication: None  Status is: Inpatient  Remains inpatient appropriate because:IV treatments appropriate due to intensity of illness or inability to take PO, Inpatient level of care appropriate due to severity of illness and Hemolytic anemia, follow surgical and hematology/oncology recommendation  Dispo: The patient is from: Home              Anticipated d/c is to: Home              Anticipated d/c date is: 2 days or so when hemoglobin is more than 10.              Patient currently is not medically stable to d/c.  Difficult to place patient No  Consultants:  Hematooncology  General surgery  Procedures:  PRBC transfusion  Anti-infectives:  . Remdesivir 1/24 completed 1/28  Subjective: Today, patient was seen and examined at bedside.  Patient denies any nausea vomiting abdominal pain.  Wishes to eat more.  Denies any bleeding.  Constipation.  Objective: Vitals:   03/21/20 2028 03/22/20 0503  BP: 115/72 127/82  Pulse: 73 67  Resp: 16 18  Temp: 98 F (36.7 C) 98 F (36.7 C)  SpO2: 99% 99%    No intake or output data in the 24 hours ending 03/22/20 1019 Filed Weights   03/18/20 1607  Weight: 105.2 kg   Body mass index is 41.1 kg/m.   Physical Exam: General: Morbidly obese built, not in obvious distress HENT:   No scleral pallor or icterus noted. Oral mucosa is moist.  Chest:  Clear breath sounds.  Diminished breath sounds bilaterally. No crackles or wheezes.  CVS: S1 &S2 heard. No murmur.  Regular rate and rhythm. Abdomen: Soft, nontender, nondistended.  Bowel sounds are heard.  Laparoscopic scar healthy. Extremities: No cyanosis, clubbing or edema.  Peripheral pulses are palpable. Psych: Alert, awake and oriented, normal mood CNS:  No cranial nerve deficits.  Power equal in all extremities.   Skin: Warm and dry.  No rashes noted.   Data Review: I have personally reviewed the following laboratory data and studies,  CBC: Recent Labs  Lab 03/20/20 0036 03/20/20 1115 03/20/20 1816 03/21/20 0346 03/22/20 0356  WBC 12.5* 12.8* 11.5* 13.4* 10.1  NEUTROABS 7.5 7.3 8.1* 8.0* 5.7  HGB 8.2* 8.9* 9.8* 9.1* 8.7*  HCT 24.2* 26.3* 31.4* 27.8* 26.7*  MCV 90.3 91.3 99.1 93.0 96.4  PLT 282 275 322 328 003   Basic Metabolic Panel: Recent Labs  Lab 03/18/20 1621 03/19/20 0518 03/19/20 0822 03/19/20 1839 03/20/20 0036 03/20/20 1115 03/20/20 1816 03/21/20 0346 03/22/20 0356  NA 133* 137  --    < > 138 139 138 138 139  K 4.1 3.8  --    < > 3.6 3.6 4.4 3.7 3.9  CL 98 105  --    < > 105 103 105 102 105  CO2 23 23  --    < > 23 24 23 24 25   GLUCOSE 112* 99  --    < > 106* 97 149* 88 85  BUN 16 13  --    < > 14 17 18 18 19   CREATININE 0.78 0.66  --    < > 0.66 0.78 0.75 0.80 0.89  CALCIUM 9.4 8.4*  --    < > 8.9 8.9 9.2 9.6 9.3  MG 2.4 2.2  --   --  2.3  --   --  2.5* 2.5*  PHOS  --   --  2.8  --  3.4  --   --  4.5 4.5   < > = values in this interval not displayed.   Liver Function Tests: Recent Labs  Lab 03/20/20 0036 03/20/20 1115 03/20/20 1816 03/21/20 0346  03/22/20 0356  AST 32 36 46* 37 32  ALT 20 22 30 29 31   ALKPHOS 50 55 60 56 55  BILITOT 2.7* 3.6* 2.5* 3.0* 2.2*  PROT 6.1* 6.7 7.7 7.1 7.0  ALBUMIN 3.4* 3.9 4.3 4.0 4.0   No results for input(s): LIPASE, AMYLASE in the last 168 hours. No results for input(s): AMMONIA in the last 168 hours. Cardiac Enzymes: No results for input(s): CKTOTAL, CKMB, CKMBINDEX, TROPONINI  in the last 168 hours. BNP (last 3 results) Recent Labs    03/19/20 0518  BNP 28.3    ProBNP (last 3 results) No results for input(s): PROBNP in the last 8760 hours.  CBG: No results for input(s): GLUCAP in the last 168 hours. Recent Results (from the past 240 hour(s))  Culture, blood (Routine X 2) w Reflex to ID Panel     Status: None (Preliminary result)   Collection Time: 03/18/20  4:10 PM   Specimen: BLOOD  Result Value Ref Range Status   Specimen Description   Final    BLOOD LEFT ANTECUBITAL Performed at [facility] [address], [place], [place] [address]    Special Requests   Final    BOTTLES DRAWN AEROBIC AND ANAEROBIC Blood Culture results may not be optimal due to an inadequate volume of blood received in culture bottles Performed at [facility] [address], [place], [place] [address]    Culture   Final    NO GROWTH 3 DAYS Performed at [facility], [place] [address] [place], [place] [address]    Report Status PENDING  Incomplete  Culture, blood (Routine X 2) w Reflex to ID Panel     Status: None (Preliminary result)   Collection Time: 03/18/20  4:15 PM   Specimen: BLOOD  Result Value Ref Range Status   Specimen Description   Final    BLOOD BLOOD RIGHT HAND Performed at [facility] [address], [place], [place] [address]    Special Requests   Final    BOTTLES DRAWN AEROBIC AND ANAEROBIC Blood Culture results may not be optimal due to an inadequate volume of blood received in culture bottles Performed at [facility] [address] [place], [place] [address]    Culture   Final    NO GROWTH 3 DAYS Performed at [facility], [place] [address] [place], [place] [address]    Report Status PENDING  Incomplete  SARS CORONAVIRUS 2 (TAT 6-24 HRS) Nasopharyngeal Nasopharyngeal Swab     Status: Abnormal   Collection Time: 03/18/20  5:22 PM   Specimen: Nasopharyngeal Swab  Result Value Ref Range Status   SARS Coronavirus 2 POSITIVE (A) NEGATIVE Final    Comment: (NOTE) SARS-CoV-2 target nucleic acids are DETECTED.  The SARS-CoV-2 RNA is generally detectable in upper and lower respiratory specimens during the acute phase of infection. Positive results are indicative of the presence of SARS-CoV-2 RNA. Clinical correlation with patient history and other diagnostic information is  necessary to determine patient infection status. Positive results do not rule out bacterial infection or co-infection with other viruses.  The expected result is Negative.  Fact Sheet for Patients: SugarRoll.be  Fact Sheet for Healthcare Providers: https://www.woods-mathews.com/  This test is not yet approved or cleared by the [place] FDA and  has been authorized for detection and/or diagnosis of SARS-CoV-2 by FDA under an Emergency Use Authorization (EUA). This EUA will remain  in effect (meaning this test can be used) for the duration of the COVID-19 declaration under Section 564(b)(1) of the Act, 21 U. S.C. section 360bbb-3(b)(1), unless the authorization is terminated or revoked sooner.   Performed at [facility], [place] [address], [place], [place] [address]   Urine culture     Status: None   Collection Time: 03/18/20  8:40 PM   Specimen: Urine, Random  Result Value Ref Range Status   Specimen Description  Final    URINE, RANDOM Performed at [facility], [place] [address], [place], [place] [address]    Special Requests    Final    NONE Performed at [facility], [place] [address] [place], [place] [address]    Culture   Final    NO GROWTH Performed at [facility], [place] [address], [place], [place] [address]    Report Status 03/20/2020 FINAL  Final     Studies: No results found.    Flora Lipps, MD  [facility] 03/22/2020  If 7PM-7AM, please contact night-coverage

## 2020-03-23 ENCOUNTER — Other Ambulatory Visit: Payer: Self-pay | Admitting: Hematology and Oncology

## 2020-03-23 DIAGNOSIS — D591 Autoimmune hemolytic anemia, unspecified: Secondary | ICD-10-CM

## 2020-03-23 LAB — CBC WITH DIFFERENTIAL/PLATELET
Abs Immature Granulocytes: 0.28 10*3/uL — ABNORMAL HIGH (ref 0.00–0.07)
Basophils Absolute: 0 10*3/uL (ref 0.0–0.1)
Basophils Relative: 0 %
Eosinophils Absolute: 0 10*3/uL (ref 0.0–0.5)
Eosinophils Relative: 0 %
HCT: 26.8 % — ABNORMAL LOW (ref 36.0–46.0)
Hemoglobin: 8.6 g/dL — ABNORMAL LOW (ref 12.0–15.0)
Immature Granulocytes: 3 %
Lymphocytes Relative: 30 %
Lymphs Abs: 2.7 10*3/uL (ref 0.7–4.0)
MCH: 31 pg (ref 26.0–34.0)
MCHC: 32.1 g/dL (ref 30.0–36.0)
MCV: 96.8 fL (ref 80.0–100.0)
Monocytes Absolute: 0.6 10*3/uL (ref 0.1–1.0)
Monocytes Relative: 6 %
Neutro Abs: 5.6 10*3/uL (ref 1.7–7.7)
Neutrophils Relative %: 61 %
Platelets: 339 10*3/uL (ref 150–400)
RBC: 2.77 MIL/uL — ABNORMAL LOW (ref 3.87–5.11)
RDW: 17 % — ABNORMAL HIGH (ref 11.5–15.5)
WBC: 9.2 10*3/uL (ref 4.0–10.5)
nRBC: 0.4 % — ABNORMAL HIGH (ref 0.0–0.2)

## 2020-03-23 LAB — RETICULOCYTES
Immature Retic Fract: 33.2 % — ABNORMAL HIGH (ref 2.3–15.9)
RBC.: 2.8 MIL/uL — ABNORMAL LOW (ref 3.87–5.11)
Retic Count, Absolute: 273.8 10*3/uL — ABNORMAL HIGH (ref 19.0–186.0)
Retic Ct Pct: 9.8 % — ABNORMAL HIGH (ref 0.4–3.1)

## 2020-03-23 LAB — COMPREHENSIVE METABOLIC PANEL
ALT: 31 U/L (ref 0–44)
AST: 32 U/L (ref 15–41)
Albumin: 4 g/dL (ref 3.5–5.0)
Alkaline Phosphatase: 54 U/L (ref 38–126)
Anion gap: 12 (ref 5–15)
BUN: 20 mg/dL (ref 6–20)
CO2: 22 mmol/L (ref 22–32)
Calcium: 9.3 mg/dL (ref 8.9–10.3)
Chloride: 108 mmol/L (ref 98–111)
Creatinine, Ser: 0.8 mg/dL (ref 0.44–1.00)
GFR, Estimated: >60 mL/min (ref >60)
Glucose, Bld: 73 mg/dL (ref 70–99)
Potassium: 3.9 mmol/L (ref 3.5–5.1)
Sodium: 142 mmol/L (ref 135–145)
Total Bilirubin: 1.5 mg/dL — ABNORMAL HIGH (ref 0.3–1.2)
Total Protein: 7 g/dL (ref 6.5–8.1)

## 2020-03-23 LAB — LACTATE DEHYDROGENASE: LDH: 670 U/L — ABNORMAL HIGH (ref 98–192)

## 2020-03-23 MED ORDER — PREDNISONE 50 MG PO TABS: 100.0000 mg | ORAL_TABLET | Freq: Every day | ORAL | 0 refills | Status: DC

## 2020-03-23 MED ORDER — FOLIC ACID 1 MG PO TABS: 1.0000 mg | ORAL_TABLET | Freq: Every day | ORAL | 1 refills | Status: DC

## 2020-03-23 MED ORDER — POLYETHYLENE GLYCOL 3350 17 G PO PACK: 17.0000 g | PACK | Freq: Every day | ORAL | 0 refills | Status: DC | PRN

## 2020-03-23 NOTE — Progress Notes (Signed)
[facility] PROGRESS NOTE  Patient Care Team: [name], DO as PCP - General (Family Medicine) Berniece Salines, DO as PCP - Cardiology (Cardiology)  CHIEF COMPLAINTS/PURPOSE OF CONSULTATION:  Fatigue  ASSESSMENT & PLAN:  No problem-specific Assessment & Plan notes found for this encounter.  1. Coombs positive autoimmune hemolytic anemia Rare cases of coombs positive AIHA have been reported with COVID 19. She is currently on prednisone, Hb wavering between 8-9 LDH better, retic count slightly worse. Since she is clinically stable and able to tolerate oral prednisone, can consider DC on Prednisone 100 mg po daily with PPI prophylaxis, we will monitor her closely in the clinic. She understands that prednisone can cause delayed wound healing. If no response which is defined as Hb less than 10 in 2/3 weeks, will consider weekly ritux for 4 weeks  2. DVT prophylaxis Given COVID 19 acute infection as well as recent gastric sleeve surgery, it is reasonable to continue lovenox for anticoagulation prophylaxis while inpatient. She can be discharged without anticoagulation since she is active.   Please send CBC, CMP, LDH, retic count daily Folic acid 38m po daily  We will continue to monitor her closely  HISTORY OF PRESENTING ILLNESS:  Cindy Gilbert 45y.o. female is here because of AIHA  Interim history  She is feeling well. She would like to go home. She is able to walk to the bathroom without any dizziness. Able to eat. Passing gas, NO BM yet. Urine is slightly dark.  MEDICAL HISTORY:  Past Medical History:  Diagnosis Date   Allergy    Seasonal   Anxiety 03/22/2018   Controlled substance agreement signed 12/18/2016   For xanax   COVID-19    Depression    previously on lexapro, celexa,    Dyslipidemia    Heart murmur 12/27/2019   Hip pain, chronic    Right   Insomnia    [place]bid [place] 10/15/[place]   Multinodular goiter (nontoxic) 03/16/2019   Small  nodules- no need for bx or follow up imaging 2020   Ovarian cyst    left   Right knee pain    Vitamin D deficiency     SURGICAL HISTORY: Past Surgical History:  Procedure Laterality Date   ADENOIDECTOMY     CESAREAN SECTION  2001   DILATION AND CURETTAGE OF UTERUS     LAPAROSCOPIC GASTRIC SLEEVE RESECTION N/A 03/05/2020   Procedure: LAPAROSCOPIC GASTRIC SLEEVE RESECTION;  Surgeon: WGreer Pickerel MD;  Location: [facility];  Service: General;  Laterality: N/A;   TONSILLECTOMY     UPPER GI ENDOSCOPY N/A 03/05/2020   Procedure: UPPER GI ENDOSCOPY;  Surgeon: WGreer Pickerel MD;  Location: [facility];  Service: General;  Laterality: N/A;    SOCIAL HISTORY: Social History   Socioeconomic History   Marital status: Single    Spouse name: Not on file   Number of children: Not on file   Years of education: Not on file   Highest education level: Not on file  Occupational History   Not on file  Tobacco Use   Smoking status: Former Smoker    Packs/day: 0.25    Years: 10.00    Pack years: 2.50    Types: Cigarettes    Quit date: 02/23/2014    Years since quitting: 6.0   Smokeless tobacco: Never Used  Vaping Use   Vaping Use: Never used  Substance and Sexual Activity   Alcohol use: Yes    Alcohol/week: 5.0 standard drinks  Types: 5 Glasses of wine per week   Drug use: No   Sexual activity: Not Currently    Birth control/protection: None  Other Topics Concern   Not on file  Social History Narrative   Not on file   Social Determinants of Health   Financial Resource Strain: Not on file  Food Insecurity: Not on file  Transportation Needs: Not on file  Physical Activity: Not on file  Stress: Not on file  Social Connections: Not on file  Intimate Partner Violence: Not on file    FAMILY HISTORY: Family History  Problem Relation Age of Onset   Heart disease Father    Stroke Father    Heart attack Father    Hypertension Father    Atrial fibrillation Maternal Aunt    Cancer Paternal  Aunt        Breast   Heart murmur Maternal Grandmother    Asthma Paternal Grandmother    Hypertension Mother    Hyperlipidemia Mother     ALLERGIES:  is allergic to [place] [zolpidem tartrate], belviq [lorcaserin], bupropion, and cymbalta [duloxetine hcl].  MEDICATIONS:  Current Facility-Administered Medications  Medication Dose Route Frequency Provider Last Rate Last Admin   0.9 %  sodium chloride infusion (Manually program via Guardrails IV Fluids)   Intravenous Once Toy Baker, MD   Held at 03/18/20 1936   0.9 %  sodium chloride infusion  250 mL Intravenous PRN Toy Baker, MD       acetaminophen (TYLENOL) tablet 650 mg  650 mg Oral Q6H PRN Doutova, Anastassia, MD       diphenhydrAMINE (BENADRYL) injection 12.5 mg  12.5 mg Intravenous Q2H PRN Allie Bossier, MD   12.5 mg at 03/21/20 1140   enoxaparin (LOVENOX) injection 30 mg  30 mg Subcutaneous Q12H [name], MD   30 mg at 89/21/19 4174   folic acid (FOLVITE) tablet 1 mg  1 mg Oral Daily Allie Bossier, MD   1 mg at 03/22/20 1020   guaiFENesin-dextromethorphan (ROBITUSSIN DM) 100-10 MG/5ML syrup 10 mL  10 mL Oral Q4H PRN Toy Baker, MD       HYDROcodone-acetaminophen (NORCO/VICODIN) 5-325 MG per tablet 1-2 tablet  1-2 tablet Oral Q4H PRN Doutova, Anastassia, MD       melatonin tablet 5 mg  5 mg Oral QHS PRN Hollace Hayward K, NP   5 mg at 03/22/20 2146   metoprolol tartrate (LOPRESSOR) tablet 12.5 mg  12.5 mg Oral BID Allie Bossier, MD       multivitamins with iron tablet 1 tablet  1 tablet Oral Daily [name], MD   1 tablet at 03/22/20 1131   ondansetron (ZOFRAN) tablet 4 mg  4 mg Oral Q6H PRN Toy Baker, MD       Or   ondansetron (ZOFRAN) injection 4 mg  4 mg Intravenous Q6H PRN Doutova, Anastassia, MD   4 mg at 03/20/20 1231   pantoprazole (PROTONIX) EC tablet 40 mg  40 mg Oral Daily Doutova, Anastassia, MD   40 mg at 03/22/20 1019   polyethylene glycol (MIRALAX / GLYCOLAX) packet 17 g   17 g Oral Daily PRN [name], MD   17 g at 03/22/20 1131   predniSONE (DELTASONE) tablet 100 mg  100 mg Oral Q breakfast Bennett Vanscyoc, MD   100 mg at 03/22/20 1131   protein supplement (ENSURE MAX) liquid  11 oz Oral BID [name], MD   11 oz at 03/20/20 1102   sodium chloride  flush (NS) 0.9 % injection 3 mL  3 mL Intravenous Q12H Doutova, Anastassia, MD   3 mL at 03/22/20 1024   sodium chloride flush (NS) 0.9 % injection 3 mL  3 mL Intravenous Q12H Doutova, Anastassia, MD   3 mL at 03/22/20 2148   sodium chloride flush (NS) 0.9 % injection 3 mL  3 mL Intravenous PRN Doutova, Nyoka Lint, MD       traMADol (ULTRAM) tablet 50 mg  50 mg Oral Q6H PRN Toy Baker, MD         PHYSICAL EXAMINATION: ECOG PERFORMANCE STATUS: 0 - Asymptomatic  Vitals:   03/22/20 2028 03/23/20 0441  BP: (!) 144/91 130/85  Pulse: 76 77  Resp: 15 16  Temp: 99 F (37.2 C) 98.6 F (37 C)  SpO2: 100% 100%   Filed Weights   03/18/20 1607  Weight: 232 lb (105.2 kg)   GENERAL:alert, no distress and comfortable SKIN: Mild icterus No BLE edema.  LABORATORY DATA:  I have reviewed the data as listed Lab Results  Component Value Date   WBC 9.2 03/23/2020   HGB 8.6 (L) 03/23/2020   HCT 26.8 (L) 03/23/2020   MCV 96.8 03/23/2020   PLT 339 03/23/2020   @LASTCHEM @  RADIOGRAPHIC STUDIES: I have personally reviewed the radiological images as listed and agreed with the findings in the report. CT Abdomen Pelvis Wo Contrast  Result Date: 03/18/2020 CLINICAL DATA:  Postop gastric sleeve fever EXAM: CT ABDOMEN AND PELVIS WITHOUT CONTRAST TECHNIQUE: Multidetector CT imaging of the abdomen and pelvis was performed following the standard protocol without IV contrast. COMPARISON:  None. FINDINGS: Lower chest: Lung bases demonstrate no acute consolidation or effusion. Differential cardiac blood pool suggesting anemia. Hepatobiliary: No focal liver abnormality is seen. No gallstones, gallbladder wall  thickening, or biliary dilatation. Pancreas: Unremarkable. No pancreatic ductal dilatation or surrounding inflammatory changes. Spleen: Borderline to mildly enlarged. Adrenals/Urinary Tract: Adrenal glands are unremarkable. Kidneys are normal, without renal calculi, focal lesion, or hydronephrosis. Bladder is unremarkable. Stomach/Bowel: Postsurgical changes of the stomach consistent with history of gastric sleeve surgery. No dilated small or large bowel. Negative appendix. Vascular/Lymphatic: No significant vascular findings are present. No enlarged abdominal or pelvic lymph nodes. Reproductive: Uterus and bilateral adnexa are unremarkable. Other: Negative for free air or free fluid. Musculoskeletal: No acute or significant osseous findings. IMPRESSION: 1. Negative for acute intra-abdominal or pelvic abnormality. 2. Postsurgical changes of the stomach consistent with history of gastric sleeve surgery. 3. Borderline splenomegaly. Electronically Signed   By: Donavan Foil M.D.   On: 03/18/2020 22:17   DG CHEST PORT 1 VIEW  Result Date: 03/18/2020 CLINICAL DATA:  Recent bariatric surgery, fever, tachycardia, weakness, COVID-19 positive EXAM: PORTABLE CHEST 1 VIEW COMPARISON:  03/18/2020, 12/07/2019 FINDINGS: Single frontal view of the chest demonstrates an unremarkable cardiac silhouette. No airspace disease, effusion, or pneumothorax. No acute bony abnormalities. IMPRESSION: 1. No acute intrathoracic process. Electronically Signed   By: Randa Ngo M.D.   On: 03/18/2020 23:34   I reviewed her labs from today,  [place] Sterling Mondo M.D.

## 2020-03-23 NOTE — Discharge Summary (Signed)
Physician Discharge Summary  Cindy Gilbert WEX:[id number] DOB: [birth year] DOA: 03/18/2020  PCP: [name], DO  Admit date: 03/18/2020 Discharge date: 03/23/2020  Admitted From: Home  Discharge disposition: Home   Recommendations for Outpatient Follow-Up:   . Follow up with your primary care provider in one week . Check CBC, BMP, magnesium in the next visit . Follow-up with hemato-oncology as scheduled by the oncology clinic.  Discharge Diagnosis:   Active Problems:   Vitamin D deficiency   Controlled substance agreement signed   Anxiety   Multinodular goiter (nontoxic)   Depression   Prediabetes   S/P laparoscopic sleeve gastrectomy   Symptomatic anemia   Elevated bilirubin   COVID-19 virus infection   Discharge Condition: Improved.  Diet recommendation:  Bariatric diet  Wound care: None.  Code status: Full.   History of Present Illness:  Cindy Gilbert a 45 y.o female with past medical history of anxiety, depression, obesity, s/p recent laparoscopic sleeve gastrectomy, hyperlipidemia, vitamin D deficiency, multinodular goiter, diabetes mellitus presented to hospital with tachycardia, weakness.On January 12/2020,  patient undergone laparoscopic sleeve gastrectomy.  Patient did well with her diet and was advised was discharged home on 13th January. On 18th January, patient started having cough and fever and was tested positive for Covid.  Patient was seen in clinic by Dr. [name] and was sent to emergency department for further evaluation.  Patient was then admitted to hospital for significant anemia with Covid infection.  Hospital Course:   Following conditions were addressed during hospitalization as listed below,  Symptomatic anemia secondary to acute coombs positive hemolytic anemia Thought to be secondary to Covid infection. Status post 3 units of packed RBC during hospitalization. Patient was started on high dose oral prednisone as per  hematooncology. Patient will be followed by hematology as outpatient.  Sinus tachycardia   Resolved.  Dehydration Resolved  Morbid obesity Status post laparoscopic sleeve gastrectomy .  Will need to follow-up with general surgery as outpatient.  Dietary instructions as per surgery.  Disposition.  Patient is stable for disposition home with outpatient general surgery, PCP and pathology follow-up  Medical Consultants:    Hematooncology  General surgery  Procedures:     PRBC transfusion 3 units  Subjective:   Today, patient was seen and examined at bedside.  Patient denies any nausea, vomiting, fever, chills or rigor.  Discharge Exam:   Vitals:   03/23/20 0441 03/23/20 1256  BP: 130/85 129/84  Pulse: 77 74  Resp: 16 16  Temp: 98.6 F (37 C) 97.7 F (36.5 C)  SpO2: 100% 100%   Vitals:   03/22/20 1456 03/22/20 2028 03/23/20 0441 03/23/20 1256  BP: 125/73 (!) 144/91 130/85 129/84  Pulse: 99 76 77 74  Resp: 18 15 16 16   Temp: 98.6 F (37 C) 99 F (37.2 C) 98.6 F (37 C) 97.7 F (36.5 C)  TempSrc: Oral Oral Oral   SpO2: 99% 100% 100% 100%  Weight:      Height:       General: Alert awake, not in obvious distress morbidly obese HENT: pupils equally reacting to light,  No scleral pallor or icterus noted. Oral mucosa is moist.  Chest:  Clear breath sounds.  Diminished breath sounds bilaterally. No crackles or wheezes.  CVS: S1 &S2 heard. No murmur.  Regular rate and rhythm. Abdomen: Soft, nontender, nondistended.  Bowel sounds are heard.  Laparoscopy scar healthy Extremities: No cyanosis, clubbing or edema.  Peripheral pulses are palpable. Psych: Alert, awake and oriented,  normal mood CNS:  No cranial nerve deficits.  Power equal in all extremities.   Skin: Warm and dry.  No rashes noted.  The results of significant diagnostics from this hospitalization (including imaging, microbiology, ancillary and laboratory) are listed below for reference.      Diagnostic Studies:   CT Abdomen Pelvis Wo Contrast  Result Date: 03/18/2020 CLINICAL DATA:  Postop gastric sleeve fever EXAM: CT ABDOMEN AND PELVIS WITHOUT CONTRAST TECHNIQUE: Multidetector CT imaging of the abdomen and pelvis was performed following the standard protocol without IV contrast. COMPARISON:  None. FINDINGS: Lower chest: Lung bases demonstrate no acute consolidation or effusion. Differential cardiac blood pool suggesting anemia. Hepatobiliary: No focal liver abnormality is seen. No gallstones, gallbladder wall thickening, or biliary dilatation. Pancreas: Unremarkable. No pancreatic ductal dilatation or surrounding inflammatory changes. Spleen: Borderline to mildly enlarged. Adrenals/Urinary Tract: Adrenal glands are unremarkable. Kidneys are normal, without renal calculi, focal lesion, or hydronephrosis. Bladder is unremarkable. Stomach/Bowel: Postsurgical changes of the stomach consistent with history of gastric sleeve surgery. No dilated small or large bowel. Negative appendix. Vascular/Lymphatic: No significant vascular findings are present. No enlarged abdominal or pelvic lymph nodes. Reproductive: Uterus and bilateral adnexa are unremarkable. Other: Negative for free air or free fluid. Musculoskeletal: No acute or significant osseous findings. IMPRESSION: 1. Negative for acute intra-abdominal or pelvic abnormality. 2. Postsurgical changes of the stomach consistent with history of gastric sleeve surgery. 3. Borderline splenomegaly. Electronically Signed   By: Donavan Foil M.D.   On: 03/18/2020 22:17   DG CHEST PORT 1 VIEW  Result Date: 03/18/2020 CLINICAL DATA:  Recent bariatric surgery, fever, tachycardia, weakness, COVID-19 positive EXAM: PORTABLE CHEST 1 VIEW COMPARISON:  03/18/2020, 12/07/2019 FINDINGS: Single frontal view of the chest demonstrates an unremarkable cardiac silhouette. No airspace disease, effusion, or pneumothorax. No acute bony abnormalities. IMPRESSION: 1. No  acute intrathoracic process. Electronically Signed   By: Randa Ngo M.D.   On: 03/18/2020 23:34     Labs:   Basic Metabolic Panel: Recent Labs  Lab 03/18/20 1621 03/19/20 0518 03/19/20 4403 03/19/20 1839 03/20/20 0036 03/20/20 1115 03/20/20 1816 03/21/20 0346 03/22/20 0356 03/23/20 0344  NA 133* 137  --    < > 138 139 138 138 139 142  K 4.1 3.8  --    < > 3.6 3.6 4.4 3.7 3.9 3.9  CL 98 105  --    < > 105 103 105 102 105 108  CO2 23 23  --    < > 23 24 23 24 25 22   GLUCOSE 112* 99  --    < > 106* 97 149* 88 85 73  BUN 16 13  --    < > 14 17 18 18 19 20   CREATININE 0.78 0.66  --    < > 0.66 0.78 0.75 0.80 0.89 0.80  CALCIUM 9.4 8.4*  --    < > 8.9 8.9 9.2 9.6 9.3 9.3  MG 2.4 2.2  --   --  2.3  --   --  2.5* 2.5*  --   PHOS  --   --  2.8  --  3.4  --   --  4.5 4.5  --    < > = values in this interval not displayed.   GFR Estimated Creatinine Clearance: 104.1 mL/min (by C-G formula based on SCr of 0.8 mg/dL). Liver Function Tests: Recent Labs  Lab 03/20/20 1115 03/20/20 1816 03/21/20 0346 03/22/20 0356 03/23/20 0344  AST 36 46* 37 32  32  ALT 22 30 29 31 31   ALKPHOS 55 60 56 55 54  BILITOT 3.6* 2.5* 3.0* 2.2* 1.5*  PROT 6.7 7.7 7.1 7.0 7.0  ALBUMIN 3.9 4.3 4.0 4.0 4.0   No results for input(s): LIPASE, AMYLASE in the last 168 hours. No results for input(s): AMMONIA in the last 168 hours. Coagulation profile Recent Labs  Lab 03/18/20 1621  INR 1.2    CBC: Recent Labs  Lab 03/20/20 1115 03/20/20 1816 03/21/20 0346 03/22/20 0356 03/23/20 0344  WBC 12.8* 11.5* 13.4* 10.1 9.2  NEUTROABS 7.3 8.1* 8.0* 5.7 5.6  HGB 8.9* 9.8* 9.1* 8.7* 8.6*  HCT 26.3* 31.4* 27.8* 26.7* 26.8*  MCV 91.3 99.1 93.0 96.4 96.8  PLT 275 322 328 310 339   Cardiac Enzymes: No results for input(s): CKTOTAL, CKMB, CKMBINDEX, TROPONINI in the last 168 hours. BNP: Invalid input(s): POCBNP CBG: No results for input(s): GLUCAP in the last 168 hours. D-Dimer Recent Labs     03/21/20 0346 03/22/20 0356  DDIMER 1.93* 1.17*   Hgb A1c No results for input(s): HGBA1C in the last 72 hours. Lipid Profile No results for input(s): CHOL, HDL, LDLCALC, TRIG, CHOLHDL, LDLDIRECT in the last 72 hours. Thyroid function studies No results for input(s): TSH, T4TOTAL, T3FREE, THYROIDAB in the last 72 hours.  Invalid input(s): FREET3 Anemia work up Recent Labs    03/21/20 0346 03/22/20 0356 03/23/20 0344  FERRITIN 1,074* 923*  --   RETICCTPCT 6.7* 8.4* 9.8*   Microbiology Recent Results (from the past 240 hour(s))  Culture, blood (Routine X 2) w Reflex to ID Panel     Status: None (Preliminary result)   Collection Time: 03/18/20  4:10 PM   Specimen: BLOOD  Result Value Ref Range Status   Specimen Description   Final    BLOOD LEFT ANTECUBITAL Performed at [facility], [place] [address], [place], [place] [address]    Special Requests   Final    BOTTLES DRAWN AEROBIC AND ANAEROBIC Blood Culture results may not be optimal due to an inadequate volume of blood received in culture bottles Performed at [facility] [address] [place], [place] [address]    Culture   Final    NO GROWTH 3 DAYS Performed at [facility], [place] [address], [place], [place] [address]    Report Status PENDING  Incomplete  Culture, blood (Routine X 2) w Reflex to ID Panel     Status: None (Preliminary result)   Collection Time: 03/18/20  4:15 PM   Specimen: BLOOD  Result Value Ref Range Status   Specimen Description   Final    BLOOD BLOOD RIGHT HAND Performed at [facility] [address] [place], [place] [address]    Special Requests   Final    BOTTLES DRAWN AEROBIC AND ANAEROBIC Blood Culture results may not be optimal due to an inadequate volume of blood received in culture bottles Performed at [facility] [address] [place], [place] [address]    Culture   Final    NO GROWTH 3  DAYS Performed at [facility], [place] [address] [place],  [address]    Report Status PENDING  Incomplete  SARS CORONAVIRUS 2 (TAT 6-24 HRS) Nasopharyngeal Nasopharyngeal Swab     Status: Abnormal   Collection Time: 03/18/20  5:22 PM   Specimen: Nasopharyngeal Swab  Result Value Ref Range Status   SARS Coronavirus 2 POSITIVE (A) NEGATIVE Final  Comment: (NOTE) SARS-CoV-2 target nucleic acids are DETECTED.  The SARS-CoV-2 RNA is generally detectable in upper and lower respiratory specimens during the acute phase of infection. Positive results are indicative of the presence of SARS-CoV-2 RNA. Clinical correlation with patient history and other diagnostic information is  necessary to determine patient infection status. Positive results do not rule out bacterial infection or co-infection with other viruses.  The expected result is Negative.  Fact Sheet for Patients: SugarRoll.be  Fact Sheet for Healthcare Providers: https://www.woods-mathews.com/  This test is not yet approved or cleared by the [place] FDA and  has been authorized for detection and/or diagnosis of SARS-CoV-2 by FDA under an Emergency Use Authorization (EUA). This EUA will remain  in effect (meaning this test can be used) for the duration of the COVID-19 declaration under Section 564(b)(1) of the Act, 21 U. S.C. section 360bbb-3(b)(1), unless the authorization is terminated or revoked sooner.   Performed at [facility], [place] [address], [place], [place] [address]   Urine culture     Status: None   Collection Time: 03/18/20  8:40 PM   Specimen: Urine, Random  Result Value Ref Range Status   Specimen Description   Final    URINE, RANDOM Performed at [facility] [address], [place], [place] [address]    Special Requests   Final    NONE Performed at [facility], [place] [address], [place],   [address]    Culture   Final    NO GROWTH Performed at [facility], [place] [address], [place],  [address]    Report Status 03/20/2020 FINAL  Final     Discharge Instructions:   Discharge Instructions    Call MD for:  difficulty breathing, headache or visual disturbances   Complete by: As directed    Call MD for:  persistant nausea and vomiting   Complete by: As directed    Call MD for:  severe uncontrolled pain   Complete by: As directed    Call MD for:  temperature >100.4   Complete by: As directed    Discharge instructions   Complete by: As directed    Follow-up with hematooncology as scheduled by the clinic.  Follow-up with your primary care physician as scheduled by you.  Continue prednisone as prescribed.  Follow-up with general surgery for your surgery follow up.   Increase activity slowly   Complete by: As directed    No wound care   Complete by: As directed      Allergies as of 03/23/2020      Reactions   Ambien [zolpidem Tartrate] Itching   Belviq [lorcaserin] Itching   Bupropion Itching   Cymbalta [duloxetine Hcl] Other (See Comments)   Headache      Medication List    TAKE these medications   ALPRAZolam 0.5 MG tablet Commonly known as: XANAX Take 0.5-2 tablets (0.25-1 mg total) by mouth 2 (two) times daily as needed for anxiety.   fluticasone 50 MCG/ACT nasal spray Commonly known as: FLONASE Place 2 sprays into both nostrils daily. What changed:   when to take this  reasons to take this   folic acid 1 MG tablet Commonly known as: FOLVITE Take 1 tablet (1 mg total) by mouth daily. Start taking on: March 24, 2020   multivitamin with minerals tablet Take 1 tablet by mouth daily.   ondansetron 4 MG disintegrating tablet Commonly known as: ZOFRAN-ODT Take 1 tablet (4 mg  total) by mouth every 6 (six) hours as needed for nausea or vomiting.   pantoprazole 40 MG tablet Commonly known as: PROTONIX Take 1 tablet (40 mg total) by mouth  daily.   polyethylene glycol 17 g packet Commonly known as: MIRALAX / GLYCOLAX Take 17 g by mouth daily as needed for mild constipation.   predniSONE 50 MG tablet Commonly known as: DELTASONE Take 2 tablets (100 mg total) by mouth daily with breakfast for 10 days. Start taking on: March 24, 2020   Vitamin D (Ergocalciferol) 1.25 MG (50000 UNIT) Caps capsule Commonly known as: DRISDOL TAKE 1 CAPSULE (50,000 UNITS TOTAL) BY MOUTH EVERY 7 (SEVEN) DAYS.         Follow-up Information    Park Liter P, DO. Schedule an appointment as soon as possible for a visit in 1 week(s).   Specialty: Family Medicine Why: regular checkup Contact information: [place] [address] [phone]        Berniece Salines, DO .   Specialty: Cardiology Contact information: [address] [place]  [address] [phone]        Benay Pike, MD. Go to.   Specialty: Hematology and Oncology Why: when called by office Contact information: [place] [address] [phone]                Time coordinating discharge: 39 minutes  Signed:  Shantelle Alles  [facility] 03/23/2020, 1:15 PM

## 2020-03-23 NOTE — Progress Notes (Signed)
Patient will be discharging home today with belongings. Education on medications will be provided.

## 2020-03-23 NOTE — Progress Notes (Signed)
Standing orders placed for lab High priority message sent to scheduling team for lab and FU appointments.

## 2020-03-24 LAB — CULTURE, BLOOD (ROUTINE X 2)
Culture: NO GROWTH
Culture: NO GROWTH

## 2020-03-25 ENCOUNTER — Other Ambulatory Visit: Payer: 59

## 2020-03-25 ENCOUNTER — Telehealth: Payer: Self-pay | Admitting: Hematology and Oncology

## 2020-03-25 NOTE — Telephone Encounter (Signed)
Scheduled apt per 1/29 sch msg - pt is aware of appt date and time

## 2020-03-26 ENCOUNTER — Other Ambulatory Visit: Payer: Self-pay | Admitting: Hematology and Oncology

## 2020-03-26 ENCOUNTER — Encounter: Payer: Self-pay | Admitting: Hematology and Oncology

## 2020-03-26 ENCOUNTER — Encounter: Payer: 59 | Attending: General Surgery | Admitting: Skilled Nursing Facility1

## 2020-03-26 DIAGNOSIS — E669 Obesity, unspecified: Secondary | ICD-10-CM | POA: Insufficient documentation

## 2020-03-26 NOTE — Progress Notes (Signed)
Bariatric Nutrition Follow-Up Visit Medical Nutrition Therapy   NUTRITION ASSESSMENT    Mycahrt visit due to covid19: pt identified via name and DOB pt understands the limitations for this visit type  Anthropometrics  Surgery date: 03/05/2020 Surgery type: sleeve Start weight at [facility]: 242 Weight today:    Clinical  Medical hx: prediabetes Medications: steroid Labs: RBC: 2.77, hemoglobin 8.6, HCT 26.8   Lifestyle & Dietary Hx  Pt states she was in the H for 1 week due to anemia from covid19 after surgery. Pt states she has taken a leave of absence from work from lasting effects of shortness of breath and fatigue. Pt states she will have her follow up this week. Pt states she takes her multivitamin first thing in the morning and has not eaten leading to pains in her stomach.  Pt states she feels she is not tolerating eggs very well.  Pt states her focus is to be healthy which for her means increasing fruits and vegetables so she can properly heal. Pt states she has been constipated so she took cenna tea which resulted in diarrhea all day long and painful cramping. Pt states she is doing good with getting in her protein. Pt state she really does not want the chemical of protein shakes in her life.  Pt states she has no issue with her fluids other than sometimes forgetting. Pt states she feels really good at listening to her body now.   Estimated daily fluid intake: 40-60 water alone oz Estimated daily protein intake: 60+ g Supplements: folic acid Current average weekly physical activity: ADl's  24-Hr Dietary Recall First Meal: 1 soft scramble egg + 1/4 avacado  Snack:  Second Meal: protein shake (30g) Snack:  Third Meal: 6 shrimp + mushrooms + carrots  Snack:  Beverages: water, protein shakes, herbal tea  Post-Op Goals/ Signs/ Symptoms Using straws: no Drinking while eating: no Chewing/swallowing difficulties: no Changes in vision: no Changes to mood/headaches: no Hair  loss/changes to skin/nails: no Difficulty focusing/concentrating: no Sweating: no Dizziness/lightheadedness: no Palpitations: no  Carbonated/caffeinated beverages: no N/V/D/C/Gas: no Abdominal pain: no Dumping syndrome: no    NUTRITION DIAGNOSIS  Overweight/obesity ([place]-3.3) related to past poor dietary habits and physical inactivity as evidenced by completed bariatric surgery and following dietary guidelines for continued weight loss and healthy nutrition status.     NUTRITION INTERVENTION Nutrition counseling (C-1) and education (E-2) to facilitate bariatric surgery goals, including: . The importance of consuming adequate calories as well as certain nutrients daily due to the body's need for essential vitamins, minerals, and fats . The importance of daily physical activity and to reach a goal of at least 150 minutes of moderate to vigorous physical activity weekly (or as directed by their physician) due to benefits such as increased musculature and improved lab values . The importance of intuitive eating specifically learning hunger-satiety cues and understanding the importance of learning a new body: The importance of mindful eating to avoid grazing behaviors   Handouts Provided Include   Phase 3  Phase 4  Learning Style & Readiness for Change Teaching method utilized: Visual & Auditory  Demonstrated degree of understanding via: Teach Back  Readiness Level: Action Barriers to learning/adherence to lifestyle change: none identified  RD's Notes for Next Visit . Assess adherence to pt chosen goals   MONITORING & EVALUATION Dietary intake, weekly physical activity, body weight  Next Steps Patient is to follow-up in 6 weeks

## 2020-03-26 NOTE — Progress Notes (Signed)
I tried calling the patient since I dont see any labs. She cancelled the lab appointment yesterday and the lab appointment tomorrow. She is now scheduled to see [place] on 2/3. I very clearly mentioned to her the importance of doing labs thrice a week and FU with me once a week. She expressed understanding, I am not sure what led to the cancellation. Left a voicemail to call us back.  Cindy Gilbert

## 2020-03-27 ENCOUNTER — Encounter: Payer: Self-pay | Admitting: Hematology and Oncology

## 2020-03-27 ENCOUNTER — Ambulatory Visit: Payer: 59 | Admitting: Hematology and Oncology

## 2020-03-27 ENCOUNTER — Other Ambulatory Visit: Payer: 59

## 2020-03-27 ENCOUNTER — Inpatient Hospital Stay: Payer: 59

## 2020-03-27 ENCOUNTER — Telehealth: Payer: Self-pay | Admitting: Hematology and Oncology

## 2020-03-27 ENCOUNTER — Inpatient Hospital Stay: Payer: 59 | Attending: Hematology and Oncology | Admitting: Hematology and Oncology

## 2020-03-27 ENCOUNTER — Other Ambulatory Visit: Payer: Self-pay

## 2020-03-27 VITALS — BP 141/96 | HR 89 | Temp 98.0°F | Resp 18 | Ht 63.0 in | Wt 226.8 lb

## 2020-03-27 DIAGNOSIS — Z87891 Personal history of nicotine dependence: Secondary | ICD-10-CM | POA: Insufficient documentation

## 2020-03-27 DIAGNOSIS — Z9884 Bariatric surgery status: Secondary | ICD-10-CM | POA: Diagnosis not present

## 2020-03-27 DIAGNOSIS — E559 Vitamin D deficiency, unspecified: Secondary | ICD-10-CM | POA: Insufficient documentation

## 2020-03-27 DIAGNOSIS — Z8616 Personal history of COVID-19: Secondary | ICD-10-CM | POA: Diagnosis not present

## 2020-03-27 DIAGNOSIS — D5919 Other autoimmune hemolytic anemia: Secondary | ICD-10-CM | POA: Insufficient documentation

## 2020-03-27 DIAGNOSIS — D591 Autoimmune hemolytic anemia, unspecified: Secondary | ICD-10-CM

## 2020-03-27 LAB — CBC WITH DIFFERENTIAL/PLATELET
Abs Immature Granulocytes: 0.07 10*3/uL (ref 0.00–0.07)
Basophils Absolute: 0 10*3/uL (ref 0.0–0.1)
Basophils Relative: 0 %
Eosinophils Absolute: 0 10*3/uL (ref 0.0–0.5)
Eosinophils Relative: 0 %
HCT: 31.5 % — ABNORMAL LOW (ref 36.0–46.0)
Hemoglobin: 10.4 g/dL — ABNORMAL LOW (ref 12.0–15.0)
Immature Granulocytes: 1 %
Lymphocytes Relative: 23 %
Lymphs Abs: 2.4 10*3/uL (ref 0.7–4.0)
MCH: 30.4 pg (ref 26.0–34.0)
MCHC: 33 g/dL (ref 30.0–36.0)
MCV: 92.1 fL (ref 80.0–100.0)
Monocytes Absolute: 0.8 10*3/uL (ref 0.1–1.0)
Monocytes Relative: 7 %
Neutro Abs: 7.1 10*3/uL (ref 1.7–7.7)
Neutrophils Relative %: 69 %
Platelets: 381 10*3/uL (ref 150–400)
RBC: 3.42 MIL/uL — ABNORMAL LOW (ref 3.87–5.11)
RDW: 15.7 % — ABNORMAL HIGH (ref 11.5–15.5)
WBC: 10.4 10*3/uL (ref 4.0–10.5)
nRBC: 0.4 % — ABNORMAL HIGH (ref 0.0–0.2)

## 2020-03-27 LAB — RETICULOCYTES
Immature Retic Fract: 25.7 % — ABNORMAL HIGH (ref 2.3–15.9)
RBC.: 3.37 MIL/uL — ABNORMAL LOW (ref 3.87–5.11)
Retic Count, Absolute: 230.2 10*3/uL — ABNORMAL HIGH (ref 19.0–186.0)
Retic Ct Pct: 6.8 % — ABNORMAL HIGH (ref 0.4–3.1)

## 2020-03-27 LAB — LACTATE DEHYDROGENASE: LDH: 590 U/L — ABNORMAL HIGH (ref 98–192)

## 2020-03-27 LAB — CMP (CANCER CENTER ONLY)
ALT: 28 U/L (ref 0–44)
AST: 19 U/L (ref 15–41)
Albumin: 4.2 g/dL (ref 3.5–5.0)
Alkaline Phosphatase: 70 U/L (ref 38–126)
Anion gap: 9 (ref 5–15)
BUN: 17 mg/dL (ref 6–20)
CO2: 25 mmol/L (ref 22–32)
Calcium: 9.5 mg/dL (ref 8.9–10.3)
Chloride: 105 mmol/L (ref 98–111)
Creatinine: 0.83 mg/dL (ref 0.44–1.00)
GFR, Estimated: >60 mL/min (ref >60)
Glucose, Bld: 89 mg/dL (ref 70–99)
Potassium: 3.7 mmol/L (ref 3.5–5.1)
Sodium: 139 mmol/L (ref 135–145)
Total Bilirubin: 1.2 mg/dL (ref 0.3–1.2)
Total Protein: 7.2 g/dL (ref 6.5–8.1)

## 2020-03-27 MED ORDER — PREDNISONE 20 MG PO TABS: 80.0000 mg | ORAL_TABLET | Freq: Every day | ORAL | 0 refills | Status: DC

## 2020-03-27 MED ORDER — PREDNISONE 20 MG PO TABS: 80.0000 mg | ORAL_TABLET | Freq: Every day | ORAL | Status: DC

## 2020-03-27 NOTE — Progress Notes (Signed)
[facility] PROGRESS NOTE  Patient Care Team: [name], DO as PCP - General (Family Medicine) Berniece Salines, DO as PCP - Cardiology (Cardiology)  CHIEF COMPLAINTS/PURPOSE OF CONSULTATION:  Fatigue  ASSESSMENT & PLAN:   No problem-specific Assessment & Plan notes found for this encounter.  1. Coombs positive autoimmune hemolytic anemia Rare cases of coombs positive AIHA have been reported with COVID 19. She is currently on prednisone 100 mg po daily CBC today showed improving hemoglobin 10.4 today, Retic count improving, rest of the labs are pending Will taper prednisone to 80 mg daily for a week, FU with me in one week, labs on Monday. Standing labs in chart. Folic acid 80m po daily  We will continue to monitor her closely  HISTORY OF PRESENTING ILLNESS:  Cindy Gilbert 45y.o. female who is admitted with chief complaint of fatigue and SOB. She was found to have coombs positive AIHA, responded to prednisone. Given recent COVID 19 infection, she didn't get rituximab. She is establishing outpatient with [place] hospital discharge. She is doing really well, fatigue almost resolved, 80% of her baseline. She denies any other complaints. Eating well, had to take laxative, had good BM  MEDICAL HISTORY:  Past Medical History:  Diagnosis Date  . Allergy    Seasonal  . Anxiety 03/22/2018  . Controlled substance agreement signed 12/18/2016   For xanax  . COVID-19   . Depression    previously on lexapro, celexa,   . Dyslipidemia   . Heart murmur 12/27/2019  . Hip pain, chronic    Right  . Insomnia   . [place]  . Multinodular goiter (nontoxic) 03/16/2019   Small nodules- no need for bx or follow up imaging 2020  . Ovarian cyst    left  . Right knee pain   . Vitamin D deficiency     SURGICAL HISTORY: Past Surgical History:  Procedure Laterality Date  . ADENOIDECTOMY    . CESAREAN SECTION  2001  . DILATION AND CURETTAGE OF  UTERUS    . LAPAROSCOPIC GASTRIC SLEEVE RESECTION N/A 03/05/2020   Procedure: LAPAROSCOPIC GASTRIC SLEEVE RESECTION;  Surgeon: WGreer Pickerel MD;  Location: [facility];  Service: General;  Laterality: N/A;  . TONSILLECTOMY    . UPPER GI ENDOSCOPY N/A 03/05/2020   Procedure: UPPER GI ENDOSCOPY;  Surgeon: WGreer Pickerel MD;  Location: [facility];  Service: General;  Laterality: N/A;    SOCIAL HISTORY: Social History   Socioeconomic History  . Marital status: Single    Spouse name: Not on file  . Number of children: Not on file  . Years of education: Not on file  . Highest education level: Not on file  Occupational History  . Not on file  Tobacco Use  . Smoking status: Former Smoker    Packs/day: 0.25    Years: 10.00    Pack years: 2.50    Types: Cigarettes    Quit date: 02/23/2014    Years since quitting: 6.0  . Smokeless tobacco: Never Used  Vaping Use  . Vaping Use: Never used  Substance and Sexual Activity  . Alcohol use: Yes    Alcohol/week: 5.0 standard drinks    Types: 5 Glasses of wine per week  . Drug use: No  . Sexual activity: Not Currently    Birth control/protection: None  Other Topics Concern  . Not on file  Social History Narrative  . Not on file   Social Determinants of Health  Financial Resource Strain: Not on file  Food Insecurity: Not on file  Transportation Needs: Not on file  Physical Activity: Not on file  Stress: Not on file  Social Connections: Not on file  Intimate Partner Violence: Not on file    FAMILY HISTORY: Family History  Problem Relation Age of Onset  . Heart disease Father   . Stroke Father   . Heart attack Father   . Hypertension Father   . Atrial fibrillation Maternal Aunt   . Cancer Paternal Aunt        Breast  . Heart murmur Maternal Grandmother   . Asthma Paternal Grandmother   . Hypertension Mother   . Hyperlipidemia Mother     ALLERGIES:  is allergic to [place] [zolpidem tartrate], belviq [lorcaserin], bupropion, and  cymbalta [duloxetine hcl].  MEDICATIONS:  Current Outpatient Medications  Medication Sig Dispense Refill  . ALPRAZolam (XANAX) 0.5 MG tablet Take 0.5-2 tablets (0.25-1 mg total) by mouth 2 (two) times daily as needed for anxiety. 60 tablet 1  . fluticasone (FLONASE) 50 MCG/ACT nasal spray Place 2 sprays into both nostrils daily. (Patient taking differently: Place 2 sprays into both nostrils daily as needed for allergies.) 16 g 12  . folic acid (FOLVITE) 1 MG tablet Take 1 tablet (1 mg total) by mouth daily. 30 tablet 1  . Multiple Vitamins-Minerals (MULTIVITAMIN WITH MINERALS) tablet Take 1 tablet by mouth daily.    . ondansetron (ZOFRAN-ODT) 4 MG disintegrating tablet Take 1 tablet (4 mg total) by mouth every 6 (six) hours as needed for nausea or vomiting. 20 tablet 0  . pantoprazole (PROTONIX) 40 MG tablet Take 1 tablet (40 mg total) by mouth daily. 90 tablet 0  . polyethylene glycol (MIRALAX / GLYCOLAX) 17 g packet Take 17 g by mouth daily as needed for mild constipation. 14 each 0  . Vitamin D, Ergocalciferol, (DRISDOL) 1.25 MG (50000 UNIT) CAPS capsule TAKE 1 CAPSULE (50,000 UNITS TOTAL) BY MOUTH EVERY 7 (SEVEN) DAYS. 12 capsule 2   Current Facility-Administered Medications  Medication Dose Route Frequency Provider Last Rate Last Admin  . [START ON 03/28/2020] predniSONE (DELTASONE) tablet 80 mg  80 mg Oral Q breakfast Halana Deisher, MD         PHYSICAL EXAMINATION: ECOG PERFORMANCE STATUS: 0 - Asymptomatic  Vitals:   03/27/20 0900  BP: (!) 141/96  Pulse: 89  Resp: 18  Temp: 98 F (36.7 C)  SpO2: 96%   Filed Weights   03/27/20 0900  Weight: 226 lb 12.8 oz (102.9 kg)    GENERAL:alert, no distress and comfortable SKIN: No major concerns EYES: normal, conjunctiva are pink and non-injected, sclera clear OROPHARYNX:no exudate, no erythema and lips, buccal mucosa, and tongue normal  LYMPH:  no palpable lymphadenopathy in the cervical, axillary or inguinal LUNGS: clear to  auscultation and percussion with normal breathing effort HEART: regular rate & rhythm and no murmurs and no lower extremity edema ABDOMEN:abdomen soft, non-tender and normal bowel sounds Musculoskeletal:no cyanosis of digits and no clubbing  PSYCH: alert & oriented x 3 with fluent speech NEURO: no focal motor/sensory deficits  LABORATORY DATA:  I have reviewed the data as listed Lab Results  Component Value Date   WBC 10.4 03/27/2020   HGB 10.4 (L) 03/27/2020   HCT 31.5 (L) 03/27/2020   MCV 92.1 03/27/2020   PLT 381 03/27/2020   @LASTCHEM @  RADIOGRAPHIC STUDIES: I have personally reviewed the radiological images as listed and agreed with the findings in the report. CT Abdomen  Pelvis Wo Contrast  Result Date: 03/18/2020 CLINICAL DATA:  Postop gastric sleeve fever EXAM: CT ABDOMEN AND PELVIS WITHOUT CONTRAST TECHNIQUE: Multidetector CT imaging of the abdomen and pelvis was performed following the standard protocol without IV contrast. COMPARISON:  None. FINDINGS: Lower chest: Lung bases demonstrate no acute consolidation or effusion. Differential cardiac blood pool suggesting anemia. Hepatobiliary: No focal liver abnormality is seen. No gallstones, gallbladder wall thickening, or biliary dilatation. Pancreas: Unremarkable. No pancreatic ductal dilatation or surrounding inflammatory changes. Spleen: Borderline to mildly enlarged. Adrenals/Urinary Tract: Adrenal glands are unremarkable. Kidneys are normal, without renal calculi, focal lesion, or hydronephrosis. Bladder is unremarkable. Stomach/Bowel: Postsurgical changes of the stomach consistent with history of gastric sleeve surgery. No dilated small or large bowel. Negative appendix. Vascular/Lymphatic: No significant vascular findings are present. No enlarged abdominal or pelvic lymph nodes. Reproductive: Uterus and bilateral adnexa are unremarkable. Other: Negative for free air or free fluid. Musculoskeletal: No acute or significant osseous  findings. IMPRESSION: 1. Negative for acute intra-abdominal or pelvic abnormality. 2. Postsurgical changes of the stomach consistent with history of gastric sleeve surgery. 3. Borderline splenomegaly. Electronically Signed   By: Donavan Foil M.D.   On: 03/18/2020 22:17   DG CHEST PORT 1 VIEW  Result Date: 03/18/2020 CLINICAL DATA:  Recent bariatric surgery, fever, tachycardia, weakness, COVID-19 positive EXAM: PORTABLE CHEST 1 VIEW COMPARISON:  03/18/2020, 12/07/2019 FINDINGS: Single frontal view of the chest demonstrates an unremarkable cardiac silhouette. No airspace disease, effusion, or pneumothorax. No acute bony abnormalities. IMPRESSION: 1. No acute intrathoracic process. Electronically Signed   By: Randa Ngo M.D.   On: 03/18/2020 23:34   I reviewed her labs from today,  [place] Jaice Digioia M.D.

## 2020-03-27 NOTE — Telephone Encounter (Signed)
Scheduled appointments per 2/2 los. Spoke to patient who is aware of appointments dates and times.

## 2020-03-27 NOTE — Addendum Note (Signed)
Addended by: Adaline Sill on: 03/27/2020 01:27 PM   Modules accepted: Orders

## 2020-03-28 ENCOUNTER — Other Ambulatory Visit: Payer: 59

## 2020-03-28 ENCOUNTER — Ambulatory Visit: Payer: 59 | Admitting: Hematology and Oncology

## 2020-03-29 ENCOUNTER — Other Ambulatory Visit: Payer: 59

## 2020-04-01 ENCOUNTER — Telehealth: Payer: Self-pay | Admitting: Skilled Nursing Facility1

## 2020-04-01 ENCOUNTER — Inpatient Hospital Stay: Payer: 59

## 2020-04-01 ENCOUNTER — Other Ambulatory Visit: Payer: Self-pay

## 2020-04-01 DIAGNOSIS — D5919 Other autoimmune hemolytic anemia: Secondary | ICD-10-CM | POA: Diagnosis not present

## 2020-04-01 DIAGNOSIS — D591 Autoimmune hemolytic anemia, unspecified: Secondary | ICD-10-CM

## 2020-04-01 LAB — RETICULOCYTES
Immature Retic Fract: 26.7 % — ABNORMAL HIGH (ref 2.3–15.9)
RBC.: 3.39 MIL/uL — ABNORMAL LOW (ref 3.87–5.11)
Retic Count, Absolute: 152.9 10*3/uL (ref 19.0–186.0)
Retic Ct Pct: 4.5 % — ABNORMAL HIGH (ref 0.4–3.1)

## 2020-04-01 LAB — CBC WITH DIFFERENTIAL/PLATELET
Abs Immature Granulocytes: 0.06 10*3/uL (ref 0.00–0.07)
Basophils Absolute: 0 10*3/uL (ref 0.0–0.1)
Basophils Relative: 0 %
Eosinophils Absolute: 0 10*3/uL (ref 0.0–0.5)
Eosinophils Relative: 0 %
HCT: 31 % — ABNORMAL LOW (ref 36.0–46.0)
Hemoglobin: 10.4 g/dL — ABNORMAL LOW (ref 12.0–15.0)
Immature Granulocytes: 1 %
Lymphocytes Relative: 30 %
Lymphs Abs: 2.7 10*3/uL (ref 0.7–4.0)
MCH: 31 pg (ref 26.0–34.0)
MCHC: 33.5 g/dL (ref 30.0–36.0)
MCV: 92.3 fL (ref 80.0–100.0)
Monocytes Absolute: 0.6 10*3/uL (ref 0.1–1.0)
Monocytes Relative: 6 %
Neutro Abs: 5.9 10*3/uL (ref 1.7–7.7)
Neutrophils Relative %: 63 %
Platelets: 284 10*3/uL (ref 150–400)
RBC: 3.36 MIL/uL — ABNORMAL LOW (ref 3.87–5.11)
RDW: 15 % (ref 11.5–15.5)
WBC: 9.3 10*3/uL (ref 4.0–10.5)
nRBC: 0 % (ref 0.0–0.2)

## 2020-04-01 LAB — CMP (CANCER CENTER ONLY)
ALT: 30 U/L (ref 0–44)
AST: 19 U/L (ref 15–41)
Albumin: 4.1 g/dL (ref 3.5–5.0)
Alkaline Phosphatase: 63 U/L (ref 38–126)
Anion gap: 11 (ref 5–15)
BUN: 19 mg/dL (ref 6–20)
CO2: 25 mmol/L (ref 22–32)
Calcium: 9.2 mg/dL (ref 8.9–10.3)
Chloride: 106 mmol/L (ref 98–111)
Creatinine: 0.83 mg/dL (ref 0.44–1.00)
GFR, Estimated: >60 mL/min (ref >60)
Glucose, Bld: 84 mg/dL (ref 70–99)
Potassium: 3.7 mmol/L (ref 3.5–5.1)
Sodium: 142 mmol/L (ref 135–145)
Total Bilirubin: 0.9 mg/dL (ref 0.3–1.2)
Total Protein: 7 g/dL (ref 6.5–8.1)

## 2020-04-01 LAB — LACTATE DEHYDROGENASE: LDH: 370 U/L — ABNORMAL HIGH (ref 98–192)

## 2020-04-01 NOTE — Telephone Encounter (Signed)
Called to check in on pts progress.  LVM

## 2020-04-02 LAB — ANTINUCLEAR ANTIBODIES, IFA: ANA Ab, IFA: NEGATIVE

## 2020-04-03 ENCOUNTER — Other Ambulatory Visit: Payer: Self-pay

## 2020-04-03 ENCOUNTER — Inpatient Hospital Stay (HOSPITAL_BASED_OUTPATIENT_CLINIC_OR_DEPARTMENT_OTHER): Payer: 59 | Admitting: Hematology and Oncology

## 2020-04-03 ENCOUNTER — Encounter: Payer: Self-pay | Admitting: Hematology and Oncology

## 2020-04-03 ENCOUNTER — Telehealth: Payer: Self-pay | Admitting: Hematology and Oncology

## 2020-04-03 VITALS — BP 132/86 | HR 72 | Temp 96.9°F | Resp 18 | Ht 63.0 in | Wt 225.7 lb

## 2020-04-03 DIAGNOSIS — D591 Autoimmune hemolytic anemia, unspecified: Secondary | ICD-10-CM

## 2020-04-03 DIAGNOSIS — D5919 Other autoimmune hemolytic anemia: Secondary | ICD-10-CM | POA: Diagnosis not present

## 2020-04-03 MED ORDER — PREDNISONE 20 MG PO TABS: 60.0000 mg | ORAL_TABLET | Freq: Every day | ORAL | 0 refills | Status: AC

## 2020-04-03 NOTE — Progress Notes (Signed)
[facility] PROGRESS NOTE  Patient Care Team: [name], DO as PCP - General (Family Medicine) Berniece Salines, DO as PCP - Cardiology (Cardiology)  CHIEF COMPLAINTS/PURPOSE OF CONSULTATION:  Fatigue  ASSESSMENT & PLAN:   No problem-specific Assessment & Plan notes found for this encounter.  1. Coombs positive autoimmune hemolytic anemia Rare cases of coombs positive AIHA have been reported with COVID 19. She is currently on prednisone 80 mg po daily CBC from 2/7 with resolving hemolysis. Will continue to taper prednisone. She will start prednisone 60 mg po daily for the next 7 days. Standing labs in chart once weekly Folic acid 61m po daily  We will continue to monitor her closely  HISTORY OF PRESENTING ILLNESS:  Cindy Gilbert 45y.o. female who is admitted with chief complaint of fatigue and SOB. She was found to have coombs positive AIHA, responded to prednisone. Given recent COVID 19 infection, she didn't get rituximab.  She is doing really well, fatigue almost resolved,  She denies any other complaints. Eating well, has noticed some heartburn, using protonix.  MEDICAL HISTORY:  Past Medical History:  Diagnosis Date  . Allergy    Seasonal  . Anxiety 03/22/2018  . Controlled substance agreement signed 12/18/2016   For xanax  . COVID-19   . Depression    previously on lexapro, celexa,   . Dyslipidemia   . Heart murmur 12/27/2019  . Hip pain, chronic    Right  . Insomnia   . [place] 10[place]  . Multinodular goiter (nontoxic) 03/16/2019   Small nodules- no need for bx or follow up imaging 2020  . Ovarian cyst    left  . Right knee pain   . Vitamin D deficiency     SURGICAL HISTORY: Past Surgical History:  Procedure Laterality Date  . ADENOIDECTOMY    . CESAREAN SECTION  2001  . DILATION AND CURETTAGE OF UTERUS    . LAPAROSCOPIC GASTRIC SLEEVE RESECTION N/A 03/05/2020   Procedure: LAPAROSCOPIC GASTRIC SLEEVE RESECTION;   Surgeon: WGreer Pickerel MD;  Location: [facility];  Service: General;  Laterality: N/A;  . TONSILLECTOMY    . UPPER GI ENDOSCOPY N/A 03/05/2020   Procedure: UPPER GI ENDOSCOPY;  Surgeon: WGreer Pickerel MD;  Location: [facility];  Service: General;  Laterality: N/A;    SOCIAL HISTORY: Social History   Socioeconomic History  . Marital status: Single    Spouse name: Not on file  . Number of children: Not on file  . Years of education: Not on file  . Highest education level: Not on file  Occupational History  . Not on file  Tobacco Use  . Smoking status: Former Smoker    Packs/day: 0.25    Years: 10.00    Pack years: 2.50    Types: Cigarettes    Quit date: 02/23/2014    Years since quitting: 6.1  . Smokeless tobacco: Never Used  Vaping Use  . Vaping Use: Never used  Substance and Sexual Activity  . Alcohol use: Yes    Alcohol/week: 5.0 standard drinks    Types: 5 Glasses of wine per week  . Drug use: No  . Sexual activity: Not Currently    Birth control/protection: None  Other Topics Concern  . Not on file  Social History Narrative  . Not on file   Social Determinants of Health   Financial Resource Strain: Not on file  Food Insecurity: Not on file  Transportation Needs: Not on file  Physical  Activity: Not on file  Stress: Not on file  Social Connections: Not on file  Intimate Partner Violence: Not on file    FAMILY HISTORY: Family History  Problem Relation Age of Onset  . Heart disease Father   . Stroke Father   . Heart attack Father   . Hypertension Father   . Atrial fibrillation Maternal Aunt   . Cancer Paternal Aunt        Breast  . Heart murmur Maternal Grandmother   . Asthma Paternal Grandmother   . Hypertension Mother   . Hyperlipidemia Mother     ALLERGIES:  is allergic to [place] [zolpidem tartrate], belviq [lorcaserin], bupropion, and cymbalta [duloxetine hcl].  MEDICATIONS:  Current Outpatient Medications  Medication Sig Dispense Refill  . ALPRAZolam  (XANAX) 0.5 MG tablet Take 0.5-2 tablets (0.25-1 mg total) by mouth 2 (two) times daily as needed for anxiety. 60 tablet 1  . fluticasone (FLONASE) 50 MCG/ACT nasal spray Place 2 sprays into both nostrils daily. (Patient taking differently: Place 2 sprays into both nostrils daily as needed for allergies.) 16 g 12  . folic acid (FOLVITE) 1 MG tablet Take 1 tablet (1 mg total) by mouth daily. 30 tablet 1  . Multiple Vitamins-Minerals (MULTIVITAMIN WITH MINERALS) tablet Take 1 tablet by mouth daily.    . ondansetron (ZOFRAN-ODT) 4 MG disintegrating tablet Take 1 tablet (4 mg total) by mouth every 6 (six) hours as needed for nausea or vomiting. 20 tablet 0  . pantoprazole (PROTONIX) 40 MG tablet Take 1 tablet (40 mg total) by mouth daily. 90 tablet 0  . polyethylene glycol (MIRALAX / GLYCOLAX) 17 g packet Take 17 g by mouth daily as needed for mild constipation. 14 each 0  . predniSONE (DELTASONE) 20 MG tablet Take 3 tablets (60 mg total) by mouth daily with breakfast for 8 days. 35 tablet 0  . Vitamin D, Ergocalciferol, (DRISDOL) 1.25 MG (50000 UNIT) CAPS capsule TAKE 1 CAPSULE (50,000 UNITS TOTAL) BY MOUTH EVERY 7 (SEVEN) DAYS. 12 capsule 2   No current facility-administered medications for this visit.     PHYSICAL EXAMINATION: ECOG PERFORMANCE STATUS: 0 - Asymptomatic  Vitals:   04/03/20 1201  BP: 132/86  Pulse: 72  Resp: 18  Temp: (!) 96.9 F (36.1 C)  SpO2: 100%   Filed Weights   04/03/20 1201  Weight: 225 lb 11.2 oz (102.4 kg)    GENERAL:alert, no distress and comfortable SKIN: No major concerns EYES: normal, conjunctiva are pink and non-injected, sclera clear OROPHARYNX:no exudate, no erythema and lips, buccal mucosa, and tongue normal  LYMPH:  no palpable lymphadenopathy in the cervical, axillary or inguinal LUNGS: clear to auscultation and percussion with normal breathing effort HEART: regular rate & rhythm and no murmurs and no lower extremity edema ABDOMEN:abdomen soft,  non-tender and normal bowel sounds Musculoskeletal:no cyanosis of digits and no clubbing  PSYCH: alert & oriented x 3 with fluent speech NEURO: no focal motor/sensory deficits  LABORATORY DATA:  I have reviewed the data as listed Lab Results  Component Value Date   WBC 9.3 04/01/2020   HGB 10.4 (L) 04/01/2020   HCT 31.0 (L) 04/01/2020   MCV 92.3 04/01/2020   PLT 284 04/01/2020   @LASTCHEM @  RADIOGRAPHIC STUDIES: I have personally reviewed the radiological images as listed and agreed with the findings in the report. CT Abdomen Pelvis Wo Contrast  Result Date: 03/18/2020 CLINICAL DATA:  Postop gastric sleeve fever EXAM: CT ABDOMEN AND PELVIS WITHOUT CONTRAST TECHNIQUE: Multidetector CT imaging  of the abdomen and pelvis was performed following the standard protocol without IV contrast. COMPARISON:  None. FINDINGS: Lower chest: Lung bases demonstrate no acute consolidation or effusion. Differential cardiac blood pool suggesting anemia. Hepatobiliary: No focal liver abnormality is seen. No gallstones, gallbladder wall thickening, or biliary dilatation. Pancreas: Unremarkable. No pancreatic ductal dilatation or surrounding inflammatory changes. Spleen: Borderline to mildly enlarged. Adrenals/Urinary Tract: Adrenal glands are unremarkable. Kidneys are normal, without renal calculi, focal lesion, or hydronephrosis. Bladder is unremarkable. Stomach/Bowel: Postsurgical changes of the stomach consistent with history of gastric sleeve surgery. No dilated small or large bowel. Negative appendix. Vascular/Lymphatic: No significant vascular findings are present. No enlarged abdominal or pelvic lymph nodes. Reproductive: Uterus and bilateral adnexa are unremarkable. Other: Negative for free air or free fluid. Musculoskeletal: No acute or significant osseous findings. IMPRESSION: 1. Negative for acute intra-abdominal or pelvic abnormality. 2. Postsurgical changes of the stomach consistent with history of gastric  sleeve surgery. 3. Borderline splenomegaly. Electronically Signed   By: Donavan Foil M.D.   On: 03/18/2020 22:17   DG CHEST PORT 1 VIEW  Result Date: 03/18/2020 CLINICAL DATA:  Recent bariatric surgery, fever, tachycardia, weakness, COVID-19 positive EXAM: PORTABLE CHEST 1 VIEW COMPARISON:  03/18/2020, 12/07/2019 FINDINGS: Single frontal view of the chest demonstrates an unremarkable cardiac silhouette. No airspace disease, effusion, or pneumothorax. No acute bony abnormalities. IMPRESSION: 1. No acute intrathoracic process. Electronically Signed   By: Randa Ngo M.D.   On: 03/18/2020 23:34   I reviewed her labs from 2/7  Time I spent 20 minutes in the care of this patient including H and P, review of records, counseling and coordination of care.  Rahil Passey M.D.

## 2020-04-03 NOTE — Telephone Encounter (Signed)
Scheduled appointments per 2/9 los. Spoke to patient who is aware of appointments dates and times.

## 2020-04-09 ENCOUNTER — Inpatient Hospital Stay: Payer: 59

## 2020-04-09 ENCOUNTER — Other Ambulatory Visit: Payer: Self-pay

## 2020-04-09 ENCOUNTER — Encounter: Payer: Self-pay | Admitting: Family Medicine

## 2020-04-09 ENCOUNTER — Ambulatory Visit (INDEPENDENT_AMBULATORY_CARE_PROVIDER_SITE_OTHER): Payer: 59 | Admitting: Family Medicine

## 2020-04-09 DIAGNOSIS — U071 COVID-19: Secondary | ICD-10-CM | POA: Diagnosis not present

## 2020-04-09 DIAGNOSIS — B977 Papillomavirus as the cause of diseases classified elsewhere: Secondary | ICD-10-CM

## 2020-04-09 DIAGNOSIS — Z9884 Bariatric surgery status: Secondary | ICD-10-CM | POA: Diagnosis not present

## 2020-04-09 DIAGNOSIS — D591 Autoimmune hemolytic anemia, unspecified: Secondary | ICD-10-CM

## 2020-04-09 DIAGNOSIS — D5919 Other autoimmune hemolytic anemia: Secondary | ICD-10-CM | POA: Diagnosis not present

## 2020-04-09 HISTORY — DX: Papillomavirus as the cause of diseases classified elsewhere: B97.7

## 2020-04-09 LAB — CBC WITH DIFFERENTIAL/PLATELET
Abs Immature Granulocytes: 0.03 10*3/uL (ref 0.00–0.07)
Basophils Absolute: 0 10*3/uL (ref 0.0–0.1)
Basophils Relative: 0 %
Eosinophils Absolute: 0.1 10*3/uL (ref 0.0–0.5)
Eosinophils Relative: 1 %
HCT: 35.1 % — ABNORMAL LOW (ref 36.0–46.0)
Hemoglobin: 11.6 g/dL — ABNORMAL LOW (ref 12.0–15.0)
Immature Granulocytes: 0 %
Lymphocytes Relative: 35 %
Lymphs Abs: 2.6 10*3/uL (ref 0.7–4.0)
MCH: 30.8 pg (ref 26.0–34.0)
MCHC: 33 g/dL (ref 30.0–36.0)
MCV: 93.1 fL (ref 80.0–100.0)
Monocytes Absolute: 0.4 10*3/uL (ref 0.1–1.0)
Monocytes Relative: 6 %
Neutro Abs: 4.3 10*3/uL (ref 1.7–7.7)
Neutrophils Relative %: 58 %
Platelets: 151 10*3/uL (ref 150–400)
RBC: 3.77 MIL/uL — ABNORMAL LOW (ref 3.87–5.11)
RDW: 14.2 % (ref 11.5–15.5)
WBC: 7.5 10*3/uL (ref 4.0–10.5)
nRBC: 0 % (ref 0.0–0.2)

## 2020-04-09 LAB — CMP (CANCER CENTER ONLY)
ALT: 25 U/L (ref 0–44)
AST: 15 U/L (ref 15–41)
Albumin: 3.7 g/dL (ref 3.5–5.0)
Alkaline Phosphatase: 58 U/L (ref 38–126)
Anion gap: 10 (ref 5–15)
BUN: 20 mg/dL (ref 6–20)
CO2: 25 mmol/L (ref 22–32)
Calcium: 8.9 mg/dL (ref 8.9–10.3)
Chloride: 106 mmol/L (ref 98–111)
Creatinine: 0.88 mg/dL (ref 0.44–1.00)
GFR, Estimated: >60 mL/min (ref >60)
Glucose, Bld: 87 mg/dL (ref 70–99)
Potassium: 3.7 mmol/L (ref 3.5–5.1)
Sodium: 141 mmol/L (ref 135–145)
Total Bilirubin: 1 mg/dL (ref 0.3–1.2)
Total Protein: 6.3 g/dL — ABNORMAL LOW (ref 6.5–8.1)

## 2020-04-09 LAB — RETICULOCYTES
Immature Retic Fract: 22.9 % — ABNORMAL HIGH (ref 2.3–15.9)
RBC.: 3.76 MIL/uL — ABNORMAL LOW (ref 3.87–5.11)
Retic Count, Absolute: 107.9 10*3/uL (ref 19.0–186.0)
Retic Ct Pct: 2.9 % (ref 0.4–3.1)

## 2020-04-09 LAB — LACTATE DEHYDROGENASE: LDH: 255 U/L — ABNORMAL HIGH (ref 98–192)

## 2020-04-09 NOTE — Progress Notes (Signed)
There were no vitals taken for this visit.   Subjective:    Patient ID: Cindy Gilbert, female    DOB: 1975-06-21, 45 y.o.   MRN: [id number]  HPI: Cindy Gilbert is a 45 y.o. female  Chief Complaint  Patient presents with   Anemia    Pt states it has much improved since hospitalization. Pt denies taking any over the counter Iron supplements.    Transition of Care Hospital Follow up.   Hospital/Facility: [facility] Physician: Dr. Louanne Belton D/C Date: 2/29/22  Records Requested: 04/09/20 Records Received:  04/09/20 Records Reviewed:  04/09/20  Diagnoses on Discharge: Autoimmune hemolytic anemia, COVID  Date of interactive Contact within 48 hours of discharge: not done  Date of 7 day or 14 day face-to-face visit:    Not within time frame  Outpatient Encounter Medications as of 04/09/2020  Medication Sig Note   ALPRAZolam (XANAX) 0.5 MG tablet Take 0.5-2 tablets (0.25-1 mg total) by mouth 2 (two) times daily as needed for anxiety.    fluticasone (FLONASE) 50 MCG/ACT nasal spray Place 2 sprays into both nostrils daily. (Patient taking differently: Place 2 sprays into both nostrils daily as needed for allergies.) 03/18/2020: Uses Seasonally    folic acid (FOLVITE) 1 MG tablet Take 1 tablet (1 mg total) by mouth daily.    Multiple Vitamins-Minerals (MULTIVITAMIN WITH MINERALS) tablet Take 1 tablet by mouth daily.    ondansetron (ZOFRAN-ODT) 4 MG disintegrating tablet Take 1 tablet (4 mg total) by mouth every 6 (six) hours as needed for nausea or vomiting. 03/18/2020: Have for PRN   pantoprazole (PROTONIX) 40 MG tablet Take 1 tablet (40 mg total) by mouth daily.    polyethylene glycol (MIRALAX / GLYCOLAX) 17 g packet Take 17 g by mouth daily as needed for mild constipation.    predniSONE (DELTASONE) 20 MG tablet Take 3 tablets (60 mg total) by mouth daily with breakfast for 8 days.    Vitamin D, Ergocalciferol, (DRISDOL) 1.25 MG (50000 UNIT) CAPS capsule TAKE 1 CAPSULE  (50,000 UNITS TOTAL) BY MOUTH EVERY 7 (SEVEN) DAYS.    No facility-administered encounter medications on file as of 04/09/2020.  Per hospitalist: "Cindy Gilbert a 45 y.o female with past medical history of anxiety, depression, obesity, s/p recent laparoscopic sleeve gastrectomy, hyperlipidemia, vitamin D deficiency, multinodular goiter, diabetes mellitus presented to hospital with tachycardia, weakness.On January 12/2020, patient undergone laparoscopic sleeve gastrectomy. Patient did well with her diet and was advised was discharged home on 13th January. On 18th January, patient started having cough and fever and was tested positive for Covid. Patient was seen in clinic by Dr. [name] and was sent to emergency department for further evaluation. Patient was then admitted to hospital for significant anemia with Covid infection."  Diagnostic Tests Reviewed:   Disposition: Home  Consults:  Hematology  Discharge Instructions: follow up with hematology and here  Disease/illness Education: discussed today  Home Health/Community Services Discussions/Referrals: N/A  Establishment or re-establishment of referral orders for community resources: N/A  Discussion with other health care providers: none  Assessment and Support of treatment regimen adherence:  excellent  Appointments Coordinated with: Patient  Education for self-management, independent living, and ADLs: discussed today  Since her hospitalization, she has not been feeling like herself. She is feeling foggy and not like herself. She is better than she was when she first got sick. Muscle fatigue, no SOB, + fatigue. She has been following with her hematologist and taking her prednisone. Things are getting better. She does  note that it has been very frustrating as she is incredibly hungry and cannot eat because of the gastric sleeve. No other concerns at this time.   Relevant past medical, surgical, family and social history  reviewed and updated as indicated. Interim medical history since our last visit reviewed. Allergies and medications reviewed and updated.  Review of Systems  Constitutional: Positive for fatigue. Negative for activity change, appetite change, chills, diaphoresis, fever and unexpected weight change.  Respiratory: Negative.   Cardiovascular: Negative.   Gastrointestinal: Negative.   Musculoskeletal: Negative.   Neurological: Negative.   Psychiatric/Behavioral: Negative.     Per HPI unless specifically indicated above     Objective:    There were no vitals taken for this visit.  Wt Readings from Last 3 Encounters:  04/03/20 225 lb 11.2 oz (102.4 kg)  03/27/20 226 lb 12.8 oz (102.9 kg)  03/18/20 232 lb (105.2 kg)    Physical Exam Vitals and nursing note reviewed.  Pulmonary:     Effort: Pulmonary effort is normal. No respiratory distress.     Comments: Speaking in full sentences Neurological:     Mental Status: She is alert.  Psychiatric:        Mood and Affect: Mood normal.        Behavior: Behavior normal.        Thought Content: Thought content normal.        Judgment: Judgment normal.     Results for orders placed or performed in visit on 04/09/20  Reticulocytes  Result Value Ref Range   Retic Ct Pct 2.9 0.4 - 3.1 %   RBC. 3.76 (L) 3.87 - 5.11 MIL/uL   Retic Count, Absolute 107.9 19.0 - 186.0 K/uL   Immature Retic Fract 22.9 (H) 2.3 - 15.9 %  Lactate dehydrogenase  Result Value Ref Range   LDH 255 (H) 98 - 192 U/L  CMP (Cancer Center only)  Result Value Ref Range   Sodium 141 135 - 145 mmol/L   Potassium 3.7 3.5 - 5.1 mmol/L   Chloride 106 98 - 111 mmol/L   CO2 25 22 - 32 mmol/L   Glucose, Bld 87 70 - 99 mg/dL   BUN 20 6 - 20 mg/dL   Creatinine 0.88 0.44 - 1.00 mg/dL   Calcium 8.9 8.9 - 10.3 mg/dL   Total Protein 6.3 (L) 6.5 - 8.1 g/dL   Albumin 3.7 3.5 - 5.0 g/dL   AST 15 15 - 41 U/L   ALT 25 0 - 44 U/L   Alkaline Phosphatase 58 38 - 126 U/L   Total  Bilirubin 1.0 0.3 - 1.2 mg/dL   GFR, Estimated >60 >60 mL/min   Anion gap 10 5 - 15  CBC with Differential/Platelet  Result Value Ref Range   WBC 7.5 4.0 - 10.5 K/uL   RBC 3.77 (L) 3.87 - 5.11 MIL/uL   Hemoglobin 11.6 (L) 12.0 - 15.0 g/dL   HCT 35.1 (L) 36.0 - 46.0 %   MCV 93.1 80.0 - 100.0 fL   MCH 30.8 26.0 - 34.0 pg   MCHC 33.0 30.0 - 36.0 g/dL   RDW 14.2 11.5 - 15.5 %   Platelets 151 150 - 400 K/uL   nRBC 0.0 0.0 - 0.2 %   Neutrophils Relative % 58 %   Neutro Abs 4.3 1.7 - 7.7 K/uL   Lymphocytes Relative 35 %   Lymphs Abs 2.6 0.7 - 4.0 K/uL   Monocytes Relative 6 %   Monocytes Absolute 0.4  0.1 - 1.0 K/uL   Eosinophils Relative 1 %   Eosinophils Absolute 0.1 0.0 - 0.5 K/uL   Basophils Relative 0 %   Basophils Absolute 0.0 0.0 - 0.1 K/uL   Immature Granulocytes 0 %   Abs Immature Granulocytes 0.03 0.00 - 0.07 K/uL      Assessment & Plan:   Problem List Items Addressed This Visit      Other   S/P laparoscopic sleeve gastrectomy    Doing OK. Keeping to bariatric diet. To follow up with her surgeon in another couple of weeks. Continue to monitor.       Autoimmune hemolytic anemia (HCC) - Primary    On prednisone. Following with hematology. Labs are improving. Continue to monitor. Call with any concerns.        Other Visit Diagnoses    COVID-19       Resolved. Continue to monitor. Call with any concerns.        Follow up plan: Return if symptoms worsen or fail to improve.     This visit was completed via telephone due to the restrictions of the COVID-19 pandemic. All issues as above were discussed and addressed but no physical exam was performed. If it was felt that the patient should be evaluated in the office, they were directed there. The patient verbally consented to this visit. Patient was unable to complete an audio/visual visit due to Lack of equipment. Due to the catastrophic nature of the COVID-19 pandemic, this visit was done through audio contact  only.  Location of the patient: home  Location of the provider: work  Those involved with this call:   Provider: Park Liter, DO  CMA: Louanna Raw, [place] Desk/Registration: Jill Side   Time spent on call: 25 minutes on the phone discussing health concerns. 40 minutes total spent in review of patient's record and preparation of their chart.

## 2020-04-10 ENCOUNTER — Telehealth: Payer: Self-pay | Admitting: Hematology and Oncology

## 2020-04-10 ENCOUNTER — Inpatient Hospital Stay (HOSPITAL_BASED_OUTPATIENT_CLINIC_OR_DEPARTMENT_OTHER): Payer: 59 | Admitting: Hematology and Oncology

## 2020-04-10 ENCOUNTER — Other Ambulatory Visit: Payer: Self-pay

## 2020-04-10 ENCOUNTER — Encounter: Payer: Self-pay | Admitting: Family Medicine

## 2020-04-10 ENCOUNTER — Encounter: Payer: Self-pay | Admitting: Hematology and Oncology

## 2020-04-10 VITALS — BP 145/91 | HR 64 | Temp 96.9°F | Resp 18 | Ht 63.0 in | Wt 222.7 lb

## 2020-04-10 DIAGNOSIS — D591 Autoimmune hemolytic anemia, unspecified: Secondary | ICD-10-CM | POA: Insufficient documentation

## 2020-04-10 DIAGNOSIS — Z760 Encounter for issue of repeat prescription: Secondary | ICD-10-CM | POA: Diagnosis not present

## 2020-04-10 DIAGNOSIS — D5919 Other autoimmune hemolytic anemia: Secondary | ICD-10-CM | POA: Diagnosis not present

## 2020-04-10 HISTORY — DX: Autoimmune hemolytic anemia, unspecified: D59.10

## 2020-04-10 MED ORDER — PREDNISONE 20 MG PO TABS: 20.0000 mg | ORAL_TABLET | Freq: Every day | ORAL | 0 refills | Status: DC

## 2020-04-10 NOTE — Progress Notes (Signed)
[facility] PROGRESS NOTE  Patient Care Team: [name], DO as PCP - General (Family Medicine) Berniece Salines, DO as PCP - Cardiology (Cardiology)  CHIEF COMPLAINTS/PURPOSE OF CONSULTATION:  Fatigue  ASSESSMENT & PLAN:   No problem-specific Assessment & Plan notes found for this encounter.  1. Coombs positive autoimmune hemolytic anemia Rare cases of coombs positive AIHA have been reported with COVID 19. She is currently on prednisone 60 mg po daily CBC from 2/15 with resolving hemolysis.  Will continue to taper prednisone. She will start prednisone 40 mg po daily for the next 7 days followed by 20 mg po daily for the 7 days. Prescription refilled. Standing labs in chart once weekly Folic acid 72m po daily  Continue protonix daily for GI prophylaxis. We will continue to monitor her closely  RTC for labs in one week, standing orders in place FU with me in 2 weeks.  HISTORY OF PRESENTING ILLNESS:  Cindy Gilbert 45y.o. female who is admitted with chief complaint of fatigue and SOB. She was found to have coombs positive AIHA, responded to prednisone. Given recent COVID 19 infection, she didn't get rituximab.  She is doing really well,no B symptoms. She is eating , losing weight as expected may be a bit sluggish. She denies any other complaints with prednisone. Appetite is ravenous at time and completely gone at some times. Will resume protonix again. No change in bowel habits No change in urinary habits  MEDICAL HISTORY:  Past Medical History:  Diagnosis Date  . Allergy    Seasonal  . Anxiety 03/22/2018  . Controlled substance agreement signed 12/18/2016   For xanax  . COVID-19   . Depression    previously on lexapro, celexa,   . Dyslipidemia   . Heart murmur 12/27/2019  . Hip pain, chronic    Right  . Insomnia   . [place]  . Multinodular goiter (nontoxic) 03/16/2019   Small nodules- no need for bx or follow up  imaging 2020  . Ovarian cyst    left  . Right knee pain   . Vitamin D deficiency     SURGICAL HISTORY: Past Surgical History:  Procedure Laterality Date  . ADENOIDECTOMY    . CESAREAN SECTION  2001  . DILATION AND CURETTAGE OF UTERUS    . LAPAROSCOPIC GASTRIC SLEEVE RESECTION N/A 03/05/2020   Procedure: LAPAROSCOPIC GASTRIC SLEEVE RESECTION;  Surgeon: WGreer Pickerel MD;  Location: [facility];  Service: General;  Laterality: N/A;  . TONSILLECTOMY    . UPPER GI ENDOSCOPY N/A 03/05/2020   Procedure: UPPER GI ENDOSCOPY;  Surgeon: WGreer Pickerel MD;  Location: [facility];  Service: General;  Laterality: N/A;    SOCIAL HISTORY: Social History   Socioeconomic History  . Marital status: Single    Spouse name: Not on file  . Number of children: Not on file  . Years of education: Not on file  . Highest education level: Not on file  Occupational History  . Not on file  Tobacco Use  . Smoking status: Former Smoker    Packs/day: 0.25    Years: 10.00    Pack years: 2.50    Types: Cigarettes    Quit date: 02/23/2014    Years since quitting: 6.1  . Smokeless tobacco: Never Used  Vaping Use  . Vaping Use: Never used  Substance and Sexual Activity  . Alcohol use: Yes    Alcohol/week: 5.0 standard drinks    Types: 5 Glasses of  wine per week  . Drug use: No  . Sexual activity: Not Currently    Birth control/protection: None  Other Topics Concern  . Not on file  Social History Narrative  . Not on file   Social Determinants of Health   Financial Resource Strain: Not on file  Food Insecurity: Not on file  Transportation Needs: Not on file  Physical Activity: Not on file  Stress: Not on file  Social Connections: Not on file  Intimate Partner Violence: Not on file    FAMILY HISTORY: Family History  Problem Relation Age of Onset  . Heart disease Father   . Stroke Father   . Heart attack Father   . Hypertension Father   . Atrial fibrillation Maternal Aunt   . Cancer Paternal Aunt         Breast  . Heart murmur Maternal Grandmother   . Asthma Paternal Grandmother   . Hypertension Mother   . Hyperlipidemia Mother     ALLERGIES:  is allergic to [place] [zolpidem tartrate], belviq [lorcaserin], bupropion, and cymbalta [duloxetine hcl].  MEDICATIONS:  Current Outpatient Medications  Medication Sig Dispense Refill  . predniSONE (DELTASONE) 20 MG tablet Take 1 tablet (20 mg total) by mouth daily with breakfast. 20 tablet 0  . ALPRAZolam (XANAX) 0.5 MG tablet Take 0.5-2 tablets (0.25-1 mg total) by mouth 2 (two) times daily as needed for anxiety. 60 tablet 1  . fluticasone (FLONASE) 50 MCG/ACT nasal spray Place 2 sprays into both nostrils daily. (Patient taking differently: Place 2 sprays into both nostrils daily as needed for allergies.) 16 g 12  . folic acid (FOLVITE) 1 MG tablet Take 1 tablet (1 mg total) by mouth daily. 30 tablet 1  . Multiple Vitamins-Minerals (MULTIVITAMIN WITH MINERALS) tablet Take 1 tablet by mouth daily.    . ondansetron (ZOFRAN-ODT) 4 MG disintegrating tablet Take 1 tablet (4 mg total) by mouth every 6 (six) hours as needed for nausea or vomiting. 20 tablet 0  . pantoprazole (PROTONIX) 40 MG tablet Take 1 tablet (40 mg total) by mouth daily. 90 tablet 0  . polyethylene glycol (MIRALAX / GLYCOLAX) 17 g packet Take 17 g by mouth daily as needed for mild constipation. 14 each 0  . predniSONE (DELTASONE) 20 MG tablet Take 3 tablets (60 mg total) by mouth daily with breakfast for 8 days. 35 tablet 0  . Vitamin D, Ergocalciferol, (DRISDOL) 1.25 MG (50000 UNIT) CAPS capsule TAKE 1 CAPSULE (50,000 UNITS TOTAL) BY MOUTH EVERY 7 (SEVEN) DAYS. 12 capsule 2   No current facility-administered medications for this visit.     PHYSICAL EXAMINATION: ECOG PERFORMANCE STATUS: 0 - Asymptomatic  Vitals:   04/10/20 1306  BP: (!) 145/91  Pulse: 64  Resp: 18  Temp: (!) 96.9 F (36.1 C)  SpO2: 100%   Filed Weights   04/10/20 1306  Weight: 222 lb 11.2 oz (101 kg)     GENERAL:alert, no distress and comfortable SKIN: No major concerns EYES: normal, conjunctiva are pink and non-injected, sclera clear OROPHARYNX:no exudate, no erythema and lips, buccal mucosa, and tongue normal  LYMPH:  no palpable lymphadenopathy in the cervical, axillary or inguinal LUNGS: clear to auscultation and percussion with normal breathing effort HEART: regular rate & rhythm and no murmurs and no lower extremity edema ABDOMEN:abdomen soft, non-tender and normal bowel sounds Musculoskeletal:no cyanosis of digits and no clubbing  PSYCH: alert & oriented x 3 with fluent speech NEURO: no focal motor/sensory deficits  LABORATORY DATA:  I have  reviewed the data as listed Lab Results  Component Value Date   WBC 7.5 04/09/2020   HGB 11.6 (L) 04/09/2020   HCT 35.1 (L) 04/09/2020   MCV 93.1 04/09/2020   PLT 151 04/09/2020   CMP reviewed no elevated bilirubin LDH mildly elevated Retic count normal.  RADIOGRAPHIC STUDIES: I have personally reviewed the radiological images as listed and agreed with the findings in the report. CT Abdomen Pelvis Wo Contrast  Result Date: 03/18/2020 CLINICAL DATA:  Postop gastric sleeve fever EXAM: CT ABDOMEN AND PELVIS WITHOUT CONTRAST TECHNIQUE: Multidetector CT imaging of the abdomen and pelvis was performed following the standard protocol without IV contrast. COMPARISON:  None. FINDINGS: Lower chest: Lung bases demonstrate no acute consolidation or effusion. Differential cardiac blood pool suggesting anemia. Hepatobiliary: No focal liver abnormality is seen. No gallstones, gallbladder wall thickening, or biliary dilatation. Pancreas: Unremarkable. No pancreatic ductal dilatation or surrounding inflammatory changes. Spleen: Borderline to mildly enlarged. Adrenals/Urinary Tract: Adrenal glands are unremarkable. Kidneys are normal, without renal calculi, focal lesion, or hydronephrosis. Bladder is unremarkable. Stomach/Bowel: Postsurgical changes of  the stomach consistent with history of gastric sleeve surgery. No dilated small or large bowel. Negative appendix. Vascular/Lymphatic: No significant vascular findings are present. No enlarged abdominal or pelvic lymph nodes. Reproductive: Uterus and bilateral adnexa are unremarkable. Other: Negative for free air or free fluid. Musculoskeletal: No acute or significant osseous findings. IMPRESSION: 1. Negative for acute intra-abdominal or pelvic abnormality. 2. Postsurgical changes of the stomach consistent with history of gastric sleeve surgery. 3. Borderline splenomegaly. Electronically Signed   By: Donavan Foil M.D.   On: 03/18/2020 22:17   DG CHEST PORT 1 VIEW  Result Date: 03/18/2020 CLINICAL DATA:  Recent bariatric surgery, fever, tachycardia, weakness, COVID-19 positive EXAM: PORTABLE CHEST 1 VIEW COMPARISON:  03/18/2020, 12/07/2019 FINDINGS: Single frontal view of the chest demonstrates an unremarkable cardiac silhouette. No airspace disease, effusion, or pneumothorax. No acute bony abnormalities. IMPRESSION: 1. No acute intrathoracic process. Electronically Signed   By: Randa Ngo M.D.   On: 03/18/2020 23:34   I reviewed her labs from 2/15  Time  NA  Cammie Faulstich M.D.

## 2020-04-10 NOTE — Assessment & Plan Note (Signed)
Doing OK. Keeping to bariatric diet. To follow up with her surgeon in another couple of weeks. Continue to monitor.

## 2020-04-10 NOTE — Assessment & Plan Note (Signed)
On prednisone. Following with hematology. Labs are improving. Continue to monitor. Call with any concerns.

## 2020-04-10 NOTE — Telephone Encounter (Signed)
Scheduled follow-up appointments per 2/16 los. Patient is aware.

## 2020-04-16 ENCOUNTER — Other Ambulatory Visit: Payer: Self-pay

## 2020-04-16 ENCOUNTER — Inpatient Hospital Stay: Payer: 59

## 2020-04-16 DIAGNOSIS — D591 Autoimmune hemolytic anemia, unspecified: Secondary | ICD-10-CM

## 2020-04-16 DIAGNOSIS — D5919 Other autoimmune hemolytic anemia: Secondary | ICD-10-CM | POA: Diagnosis not present

## 2020-04-16 LAB — CBC WITH DIFFERENTIAL/PLATELET
Abs Immature Granulocytes: 0.05 10*3/uL (ref 0.00–0.07)
Basophils Absolute: 0 10*3/uL (ref 0.0–0.1)
Basophils Relative: 0 %
Eosinophils Absolute: 0 10*3/uL (ref 0.0–0.5)
Eosinophils Relative: 0 %
HCT: 34.7 % — ABNORMAL LOW (ref 36.0–46.0)
Hemoglobin: 11.8 g/dL — ABNORMAL LOW (ref 12.0–15.0)
Immature Granulocytes: 1 %
Lymphocytes Relative: 29 %
Lymphs Abs: 2.8 10*3/uL (ref 0.7–4.0)
MCH: 30.6 pg (ref 26.0–34.0)
MCHC: 34 g/dL (ref 30.0–36.0)
MCV: 89.9 fL (ref 80.0–100.0)
Monocytes Absolute: 0.5 10*3/uL (ref 0.1–1.0)
Monocytes Relative: 5 %
Neutro Abs: 6.2 10*3/uL (ref 1.7–7.7)
Neutrophils Relative %: 65 %
Platelets: 201 10*3/uL (ref 150–400)
RBC: 3.86 MIL/uL — ABNORMAL LOW (ref 3.87–5.11)
RDW: 13.6 % (ref 11.5–15.5)
WBC: 9.7 10*3/uL (ref 4.0–10.5)
nRBC: 0 % (ref 0.0–0.2)

## 2020-04-16 LAB — CMP (CANCER CENTER ONLY)
ALT: 27 U/L (ref 0–44)
AST: 16 U/L (ref 15–41)
Albumin: 4 g/dL (ref 3.5–5.0)
Alkaline Phosphatase: 58 U/L (ref 38–126)
Anion gap: 11 (ref 5–15)
BUN: 15 mg/dL (ref 6–20)
CO2: 24 mmol/L (ref 22–32)
Calcium: 9.3 mg/dL (ref 8.9–10.3)
Chloride: 108 mmol/L (ref 98–111)
Creatinine: 0.8 mg/dL (ref 0.44–1.00)
GFR, Estimated: >60 mL/min (ref >60)
Glucose, Bld: 92 mg/dL (ref 70–99)
Potassium: 3.3 mmol/L — ABNORMAL LOW (ref 3.5–5.1)
Sodium: 143 mmol/L (ref 135–145)
Total Bilirubin: 0.7 mg/dL (ref 0.3–1.2)
Total Protein: 6.8 g/dL (ref 6.5–8.1)

## 2020-04-16 LAB — RETICULOCYTES
Immature Retic Fract: 26.4 % — ABNORMAL HIGH (ref 2.3–15.9)
RBC.: 3.89 MIL/uL (ref 3.87–5.11)
Retic Count, Absolute: 101.5 10*3/uL (ref 19.0–186.0)
Retic Ct Pct: 2.6 % (ref 0.4–3.1)

## 2020-04-16 LAB — LACTATE DEHYDROGENASE: LDH: 254 U/L — ABNORMAL HIGH (ref 98–192)

## 2020-04-25 ENCOUNTER — Other Ambulatory Visit: Payer: Self-pay

## 2020-04-25 ENCOUNTER — Encounter: Payer: Self-pay | Admitting: Hematology and Oncology

## 2020-04-25 ENCOUNTER — Inpatient Hospital Stay: Payer: 59 | Attending: Hematology and Oncology | Admitting: Hematology and Oncology

## 2020-04-25 ENCOUNTER — Telehealth: Payer: Self-pay | Admitting: Hematology and Oncology

## 2020-04-25 ENCOUNTER — Inpatient Hospital Stay: Payer: 59

## 2020-04-25 VITALS — BP 126/89 | HR 79 | Temp 96.4°F | Resp 12 | Ht 63.0 in | Wt 220.3 lb

## 2020-04-25 DIAGNOSIS — Z8616 Personal history of COVID-19: Secondary | ICD-10-CM | POA: Insufficient documentation

## 2020-04-25 DIAGNOSIS — Z87891 Personal history of nicotine dependence: Secondary | ICD-10-CM | POA: Insufficient documentation

## 2020-04-25 DIAGNOSIS — D591 Autoimmune hemolytic anemia, unspecified: Secondary | ICD-10-CM

## 2020-04-25 DIAGNOSIS — Z7952 Long term (current) use of systemic steroids: Secondary | ICD-10-CM | POA: Diagnosis not present

## 2020-04-25 DIAGNOSIS — D5919 Other autoimmune hemolytic anemia: Secondary | ICD-10-CM | POA: Diagnosis present

## 2020-04-25 LAB — CMP (CANCER CENTER ONLY)
ALT: 42 U/L (ref 0–44)
AST: 34 U/L (ref 15–41)
Albumin: 3.8 g/dL (ref 3.5–5.0)
Alkaline Phosphatase: 53 U/L (ref 38–126)
Anion gap: 8 (ref 5–15)
BUN: 10 mg/dL (ref 6–20)
CO2: 25 mmol/L (ref 22–32)
Calcium: 9.3 mg/dL (ref 8.9–10.3)
Chloride: 107 mmol/L (ref 98–111)
Creatinine: 0.78 mg/dL (ref 0.44–1.00)
GFR, Estimated: >60 mL/min (ref >60)
Glucose, Bld: 88 mg/dL (ref 70–99)
Potassium: 3.5 mmol/L (ref 3.5–5.1)
Sodium: 140 mmol/L (ref 135–145)
Total Bilirubin: 0.8 mg/dL (ref 0.3–1.2)
Total Protein: 6.5 g/dL (ref 6.5–8.1)

## 2020-04-25 LAB — CBC WITH DIFFERENTIAL/PLATELET
Abs Immature Granulocytes: 0.02 10*3/uL (ref 0.00–0.07)
Basophils Absolute: 0 10*3/uL (ref 0.0–0.1)
Basophils Relative: 0 %
Eosinophils Absolute: 0.1 10*3/uL (ref 0.0–0.5)
Eosinophils Relative: 1 %
HCT: 38.5 % (ref 36.0–46.0)
Hemoglobin: 12.7 g/dL (ref 12.0–15.0)
Immature Granulocytes: 0 %
Lymphocytes Relative: 37 %
Lymphs Abs: 2.8 10*3/uL (ref 0.7–4.0)
MCH: 29.8 pg (ref 26.0–34.0)
MCHC: 33 g/dL (ref 30.0–36.0)
MCV: 90.4 fL (ref 80.0–100.0)
Monocytes Absolute: 0.4 10*3/uL (ref 0.1–1.0)
Monocytes Relative: 5 %
Neutro Abs: 4.2 10*3/uL (ref 1.7–7.7)
Neutrophils Relative %: 57 %
Platelets: 217 10*3/uL (ref 150–400)
RBC: 4.26 MIL/uL (ref 3.87–5.11)
RDW: 12.9 % (ref 11.5–15.5)
WBC: 7.5 10*3/uL (ref 4.0–10.5)
nRBC: 0 % (ref 0.0–0.2)

## 2020-04-25 LAB — RETICULOCYTES
Immature Retic Fract: 19.4 % — ABNORMAL HIGH (ref 2.3–15.9)
RBC.: 4.08 MIL/uL (ref 3.87–5.11)
Retic Count, Absolute: 90.6 10*3/uL (ref 19.0–186.0)
Retic Ct Pct: 2.2 % (ref 0.4–3.1)

## 2020-04-25 LAB — LACTATE DEHYDROGENASE: LDH: 270 U/L — ABNORMAL HIGH (ref 98–192)

## 2020-04-25 NOTE — Progress Notes (Signed)
[place] PROGRESS NOTE  Patient Care Team: [name], DO as PCP - General (Family Medicine) Berniece Salines, DO as PCP - Cardiology (Cardiology)  CHIEF COMPLAINTS/PURPOSE OF CONSULTATION:  Fatigue  ASSESSMENT & PLAN:   No problem-specific Assessment & Plan notes found for this encounter.  1. Coombs positive autoimmune hemolytic anemia Rare cases of coombs positive AIHA have been reported with COVID 19. She is currently on prednisone 20 mg daily, will taper it to 10 mg daily for a week, 5 mg daily for the following week and discontinue CBC today is unremarkable and no concern for underlying hemolysis. Folic acid 73m po daily  Continue protonix daily for GI prophylaxis as long as she is on prednisone. We will continue to monitor her once a month at this point. She understands to call [facility] if needed.  FU with me in 4 weeks with repeat labs. Discussed lab results from today.  HISTORY OF PRESENTING ILLNESS:   Cindy Gilbert 45y.o. female who is admitted with chief complaint of fatigue and SOB. She was found to have coombs positive AIHA, responded to prednisone. Given recent COVID 19 infection, she didn't get rituximab.  She is doing really well,no B symptoms. She is eating well, weight has kind of stalled. She denies any other complaints with prednisone. No change in bowel habits except for intermittent constipation. No change in urinary habits She is understandably stressed out about her mom who has Alzheimer's disease. Rest of the pertinent 10 point ROS reviewed and negative.  MEDICAL HISTORY:  Past Medical History:  Diagnosis Date  . Allergy    Seasonal  . Anxiety 03/22/2018  . Controlled substance agreement signed 12/18/2016   For xanax  . COVID-19   . Depression    previously on lexapro, celexa,   . Dyslipidemia   . Heart murmur 12/27/2019  . Hip pain, chronic    Right  . Insomnia   . [place]bid [place]15[place]  . Multinodular  goiter (nontoxic) 03/16/2019   Small nodules- no need for bx or follow up imaging 2020  . Ovarian cyst    left  . Right knee pain   . Vitamin D deficiency     SURGICAL HISTORY: Past Surgical History:  Procedure Laterality Date  . ADENOIDECTOMY    . CESAREAN SECTION  2001  . DILATION AND CURETTAGE OF UTERUS    . LAPAROSCOPIC GASTRIC SLEEVE RESECTION N/A 03/05/2020   Procedure: LAPAROSCOPIC GASTRIC SLEEVE RESECTION;  Surgeon: WGreer Pickerel MD;  Location: [facility];  Service: General;  Laterality: N/A;  . TONSILLECTOMY    . UPPER GI ENDOSCOPY N/A 03/05/2020   Procedure: UPPER GI ENDOSCOPY;  Surgeon: WGreer Pickerel MD;  Location: [facility];  Service: General;  Laterality: N/A;    SOCIAL HISTORY: Social History   Socioeconomic History  . Marital status: Single    Spouse name: Not on file  . Number of children: Not on file  . Years of education: Not on file  . Highest education level: Not on file  Occupational History  . Not on file  Tobacco Use  . Smoking status: Former Smoker    Packs/day: 0.25    Years: 10.00    Pack years: 2.50    Types: Cigarettes    Quit date: 02/23/2014    Years since quitting: 6.1  . Smokeless tobacco: Never Used  Vaping Use  . Vaping Use: Never used  Substance and Sexual Activity  . Alcohol use: Yes  Alcohol/week: 5.0 standard drinks    Types: 5 Glasses of wine per week  . Drug use: No  . Sexual activity: Not Currently    Birth control/protection: None  Other Topics Concern  . Not on file  Social History Narrative  . Not on file   Social Determinants of Health   Financial Resource Strain: Not on file  Food Insecurity: Not on file  Transportation Needs: Not on file  Physical Activity: Not on file  Stress: Not on file  Social Connections: Not on file  Intimate Partner Violence: Not on file    FAMILY HISTORY: Family History  Problem Relation Age of Onset  . Heart disease Father   . Stroke Father   . Heart attack Father   . Hypertension  Father   . Atrial fibrillation Maternal Aunt   . Cancer Paternal Aunt        Breast  . Heart murmur Maternal Grandmother   . Asthma Paternal Grandmother   . Hypertension Mother   . Hyperlipidemia Mother     ALLERGIES:  is allergic to [place] [zolpidem tartrate], belviq [lorcaserin], bupropion, and cymbalta [duloxetine hcl].  MEDICATIONS:  Current Outpatient Medications  Medication Sig Dispense Refill  . ALPRAZolam (XANAX) 0.5 MG tablet Take 0.5-2 tablets (0.25-1 mg total) by mouth 2 (two) times daily as needed for anxiety. 60 tablet 1  . fluticasone (FLONASE) 50 MCG/ACT nasal spray Place 2 sprays into both nostrils daily. (Patient taking differently: Place 2 sprays into both nostrils daily as needed for allergies.) 16 g 12  . folic acid (FOLVITE) 1 MG tablet Take 1 tablet (1 mg total) by mouth daily. 30 tablet 1  . Multiple Vitamins-Minerals (MULTIVITAMIN WITH MINERALS) tablet Take 1 tablet by mouth daily.    . ondansetron (ZOFRAN-ODT) 4 MG disintegrating tablet Take 1 tablet (4 mg total) by mouth every 6 (six) hours as needed for nausea or vomiting. 20 tablet 0  . pantoprazole (PROTONIX) 40 MG tablet Take 1 tablet (40 mg total) by mouth daily. 90 tablet 0  . polyethylene glycol (MIRALAX / GLYCOLAX) 17 g packet Take 17 g by mouth daily as needed for mild constipation. 14 each 0  . predniSONE (DELTASONE) 20 MG tablet Take 1 tablet (20 mg total) by mouth daily with breakfast. 20 tablet 0  . Vitamin D, Ergocalciferol, (DRISDOL) 1.25 MG (50000 UNIT) CAPS capsule TAKE 1 CAPSULE (50,000 UNITS TOTAL) BY MOUTH EVERY 7 (SEVEN) DAYS. 12 capsule 2   No current facility-administered medications for this visit.     PHYSICAL EXAMINATION: ECOG PERFORMANCE STATUS: 0 - Asymptomatic  Vitals:   04/25/20 1011  BP: 126/89  Pulse: 79  Resp: 12  Temp: (!) 96.4 F (35.8 C)  SpO2: 100%   Filed Weights   04/25/20 1011  Weight: 220 lb 4.8 oz (99.9 kg)    GENERAL:alert, no distress and  comfortable SKIN: No major concerns EYES: normal, conjunctiva are pink and non-injected, sclera clear OROPHARYNX:no exudate, no erythema and lips, buccal mucosa, and tongue normal  LYMPH:  no palpable lymphadenopathy in the cervical, axillary or inguinal LUNGS: clear to auscultation and percussion with normal breathing effort HEART: regular rate & rhythm and no murmurs and no lower extremity edema ABDOMEN:abdomen soft, non-tender and normal bowel sounds Musculoskeletal:no cyanosis of digits and no clubbing  PSYCH: alert & oriented x 3 with fluent speech NEURO: no focal motor/sensory deficits  LABORATORY DATA:  I have reviewed the data as listed Lab Results  Component Value Date   WBC  9.7 04/16/2020   HGB 11.8 (L) 04/16/2020   HCT 34.7 (L) 04/16/2020   MCV 89.9 04/16/2020   PLT 201 04/16/2020   CMP reviewed no elevated bilirubin LDH mildly elevated Retic count normal. CMP unremarkable. Retic count normal.  RADIOGRAPHIC STUDIES: I have personally reviewed the radiological images as listed and agreed with the findings in the report.  No results found. Reviewed labs from today.  Time  I spent 30 minutes in the care of this patient including H and P, review of labs, counseling and coordination of care.  Lejuan Botto M.D.

## 2020-04-25 NOTE — Telephone Encounter (Signed)
Scheduled follow-up appointments per 3/3 los. Patient is aware.

## 2020-04-29 ENCOUNTER — Other Ambulatory Visit: Payer: Self-pay

## 2020-04-29 ENCOUNTER — Encounter: Payer: 59 | Attending: General Surgery | Admitting: Skilled Nursing Facility1

## 2020-04-29 NOTE — Progress Notes (Signed)
Bariatric Nutrition Follow-Up Visit Medical Nutrition Therapy   NUTRITION ASSESSMENT    Anthropometrics  Surgery date: 03/05/2020 Surgery type: sleeve Start weight at [facility]: 242 Weight today: pt declined   Clinical  Medical hx: prediabetes Medications:  Labs: RBC: 2.77, hemoglobin 8.6, HCT 26.8   Lifestyle & Dietary Hx  Pt states she has had some old habits such as emotional eating has been popping up and is aware of this. Pt states she logs about 70% of her foods/beverages.  Pt state she has been able to connect her emotional eating with how she was feeling. Pt states knowing when to stop eating has been knew and she is working on it.  Pt states whey protein makes her mouth itch.  Pt state she is feeling really good about moving her body and getting.  Pt states she is not allowed to take an iron supplement due to an issue with iron she has.   Estimated daily fluid intake: 40-60 water alone oz Estimated daily protein intake: 60+ g Supplements: multi, vitamin D (once a week), vitamin Q98, folic acid, calcium Current average weekly physical activity: trainer and spin: 3 days a  Week, and some yoga  24-Hr Dietary Recall First Meal: 1 soft scramble egg + 1/4 avacado + cheese Snack:  Second Meal: short ribs + scalloped potatoes Snack:  Third Meal: 6 shrimp + mushrooms + carrots or salmon + carrots + peppers Snack:  Beverages: water, protein shakes, herbal tea  Post-Op Goals/ Signs/ Symptoms Using straws: no Drinking while eating: no Chewing/swallowing difficulties: no Changes in vision: no Changes to mood/headaches: no Hair loss/changes to skin/nails: no Difficulty focusing/concentrating: no Sweating: no Dizziness/lightheadedness: no Palpitations: no  Carbonated/caffeinated beverages: no N/V/D/C/Gas: no Abdominal pain: no Dumping syndrome: no    NUTRITION DIAGNOSIS  Overweight/obesity ([place]-3.3) related to past poor dietary habits and physical inactivity as evidenced  by completed bariatric surgery and following dietary guidelines for continued weight loss and healthy nutrition status.     NUTRITION INTERVENTION continued  Nutrition counseling (C-1) and education (E-2) to facilitate bariatric surgery goals, including: . The importance of consuming adequate calories as well as certain nutrients daily due to the body's need for essential vitamins, minerals, and fats . The importance of daily physical activity and to reach a goal of at least 150 minutes of moderate to vigorous physical activity weekly (or as directed by their physician) due to benefits such as increased musculature and improved lab values . The importance of intuitive eating specifically learning hunger-satiety cues and understanding the importance of learning a new body: The importance of mindful eating to avoid grazing behaviors   Goals: Identify 3 emotions/needs and 3 tools to approprietly deal with it  Handouts Previously Provided Include   Phase 3  Phase 4  Needs list  Feelings List  Things to do  Learning Style & Readiness for Change Teaching method utilized: Visual & Auditory  Demonstrated degree of understanding via: Teach Back  Readiness Level: Action Barriers to learning/adherence to lifestyle change: none identified  RD's Notes for Next Visit . Assess adherence to pt chosen goals   MONITORING & EVALUATION Dietary intake, weekly physical activity, body weight  Next Steps Patient is to follow-up in 4-6 weeks

## 2020-05-10 ENCOUNTER — Other Ambulatory Visit: Payer: Self-pay

## 2020-05-10 DIAGNOSIS — G8929 Other chronic pain: Secondary | ICD-10-CM | POA: Insufficient documentation

## 2020-05-10 DIAGNOSIS — E785 Hyperlipidemia, unspecified: Secondary | ICD-10-CM | POA: Insufficient documentation

## 2020-05-10 DIAGNOSIS — M25561 Pain in right knee: Secondary | ICD-10-CM | POA: Insufficient documentation

## 2020-05-10 DIAGNOSIS — U071 COVID-19: Secondary | ICD-10-CM | POA: Insufficient documentation

## 2020-05-13 ENCOUNTER — Ambulatory Visit (INDEPENDENT_AMBULATORY_CARE_PROVIDER_SITE_OTHER): Payer: 59 | Admitting: Cardiology

## 2020-05-13 ENCOUNTER — Other Ambulatory Visit: Payer: Self-pay

## 2020-05-13 ENCOUNTER — Encounter: Payer: Self-pay | Admitting: Cardiology

## 2020-05-13 VITALS — BP 124/78 | HR 84 | Ht 63.0 in | Wt 216.1 lb

## 2020-05-13 DIAGNOSIS — E785 Hyperlipidemia, unspecified: Secondary | ICD-10-CM | POA: Diagnosis not present

## 2020-05-13 DIAGNOSIS — Q208 Other congenital malformations of cardiac chambers and connections: Secondary | ICD-10-CM

## 2020-05-13 NOTE — Patient Instructions (Signed)
Medication Instructions:  No medication changes. *If you need a refill on your cardiac medications before your next appointment, please call your pharmacy*   Lab Work: None ordered If you have labs (blood work) drawn today and your tests are completely normal, you will receive your results only by: Marland Kitchen MyChart Message (if you have MyChart) OR . A paper copy in the mail If you have any lab test that is abnormal or we need to change your treatment, we will call you to review the results.   Testing/Procedures: None ordered   Follow-Up: At [facility], you and your health needs are our priority.  As part of our continuing mission to provide you with exceptional heart care, we have created designated Provider Care Teams.  These Care Teams include your primary Cardiologist (physician) and Advanced Practice Providers (APPs -  Physician Assistants and Nurse Practitioners) who all work together to provide you with the care you need, when you need it.  We recommend signing up for the patient portal called "MyChart".  Sign up information is provided on this After Visit Summary.  MyChart is used to connect with patients for Virtual Visits (Telemedicine).  Patients are able to view lab/test results, encounter notes, upcoming appointments, etc.  Non-urgent messages can be sent to your provider as well.   To learn more about what you can do with MyChart, go to [email or web address].    Your next appointment:   As needed   The format for your next appointment:   In Person  Provider:   Berniece Salines, DO   Other Instructions NA

## 2020-05-13 NOTE — Progress Notes (Signed)
Cardiology Office Note:    Date:  05/13/2020   ID:  Cindy Gilbert, DOB 1975/04/02, MRN [id number]  PCP:  [name], DO  Cardiologist:  Berniece Salines, DO  Electrophysiologist:  None   Referring MD: [name], DO   I am doing fine  History of Present Illness:    Cindy Gilbert is a 45 y.o. female with a hx of hyperlipidemia, morbid obesity is here today for her postoperative cardiovascular visit.  Last saw the patient in November 2021 at that time she was planning surgery.  Initially she had a stress echocardiogram which was reported to be positive I sent her for coronary CTA which showed no evidence of any artery disease but she does have a LV diverticulum.  I discussed with the patient what that resolved meant and cleared the patient for her surgery.  She is postop she is doing well.  She is happy with her care.  Past Medical History:  Diagnosis Date  . Allergy    Seasonal  . Anxiety 03/22/2018  . Autoimmune hemolytic anemia ([place]) 04/10/2020  . Chronic right hip pain 03/05/2020  . Controlled substance agreement signed 12/18/2016   For xanax  . COVID-19   . Depression    previously on lexapro, celexa,   . Dyslipidemia   . Heart murmur 12/27/2019  . Hip pain, chronic    Right  . 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/09/2020  . Insomnia   . Mixed hyperlipidemia 12/06/2019  . Morbid obesity ([place]) 10/15/2019  . Multinodular goiter (nontoxic) 03/16/2019   Small nodules- no need for bx or follow up imaging 2020  . Ovarian cyst    left  . Prediabetes 03/05/2020  . Right knee pain   . S/P laparoscopic sleeve gastrectomy 03/05/2020  . Severe obesity (BMI >= 40) ([place]) 10/15/2019  . Vitamin D deficiency     Past Surgical History:  Procedure Laterality Date  . ADENOIDECTOMY    . CESAREAN SECTION  2001  . DILATION AND CURETTAGE OF UTERUS    . LAPAROSCOPIC GASTRIC SLEEVE RESECTION N/A 03/05/2020   Procedure: LAPAROSCOPIC GASTRIC SLEEVE RESECTION;  Surgeon: [name], MD;  Location: [facility];  Service: General;  Laterality: N/A;  . TONSILLECTOMY    . UPPER GI ENDOSCOPY N/A 03/05/2020   Procedure: UPPER GI ENDOSCOPY;  Surgeon: [name], MD;  Location: [facility];  Service: General;  Laterality: N/A;    Current Medications: Current Meds  Medication Sig  . ALPRAZolam (XANAX) 0.5 MG tablet Take 0.5-2 tablets (0.25-1 mg total) by mouth 2 (two) times daily as needed for anxiety.  . fluticasone (FLONASE) 50 MCG/ACT nasal spray Place 2 sprays into both nostrils daily. (Patient taking differently: Place 2 sprays into both nostrils daily as needed for allergies.)  . folic acid (FOLVITE) 1 MG tablet Take 1 tablet (1 mg total) by mouth daily.  . Multiple Vitamins-Minerals (MULTIVITAMIN WITH MINERALS) tablet Take 1 tablet by mouth daily.  . ondansetron (ZOFRAN-ODT) 4 MG disintegrating tablet Take 1 tablet (4 mg total) by mouth every 6 (six) hours as needed for nausea or vomiting.  . pantoprazole (PROTONIX) 40 MG tablet Take 1 tablet (40 mg total) by mouth daily.  . polyethylene glycol (MIRALAX / GLYCOLAX) 17 g packet Take 17 g by mouth daily as needed for mild constipation.  . predniSONE (DELTASONE) 20 MG tablet Take 1 tablet (20 mg total) by mouth daily with breakfast.  . Vitamin D, Ergocalciferol, (DRISDOL) 1.25 MG (50000 UNIT) CAPS capsule TAKE 1 CAPSULE (  50,000 UNITS TOTAL) BY MOUTH EVERY 7 (SEVEN) DAYS.     Allergies:   Ambien [zolpidem tartrate], Belviq [lorcaserin], Bupropion, and Cymbalta [duloxetine hcl]   Social History   Socioeconomic History  . Marital status: Single    Spouse name: Not on file  . Number of children: Not on file  . Years of education: Not on file  . Highest education level: Not on file  Occupational History  . Not on file  Tobacco Use  . Smoking status: Former Smoker    Packs/day: 0.25    Years: 10.00    Pack years: 2.50    Types: Cigarettes    Quit date: 02/23/2014    Years since quitting: 6.2  . Smokeless tobacco: Never Used  Vaping Use  .  Vaping Use: Never used  Substance and Sexual Activity  . Alcohol use: Yes    Alcohol/week: 5.0 standard drinks    Types: 5 Glasses of wine per week  . Drug use: No  . Sexual activity: Not Currently    Birth control/protection: None  Other Topics Concern  . Not on file  Social History Narrative  . Not on file   Social Determinants of Health   Financial Resource Strain: Not on file  Food Insecurity: Not on file  Transportation Needs: Not on file  Physical Activity: Not on file  Stress: Not on file  Social Connections: Not on file     Family History: The patient's family history includes Asthma in her paternal grandmother; Atrial fibrillation in her maternal aunt; Cancer in her paternal aunt; Heart attack in her father; Heart disease in her father; Heart murmur in her maternal grandmother; Hyperlipidemia in her mother; Hypertension in her father and mother; Stroke in her father.  ROS:   Review of Systems  Constitution: Negative for decreased appetite, fever and weight gain.  HENT: Negative for congestion, ear discharge, hoarse voice and sore throat.   Eyes: Negative for discharge, redness, vision loss in right eye and visual halos.  Cardiovascular: Negative for chest pain, dyspnea on exertion, leg swelling, orthopnea and palpitations.  Respiratory: Negative for cough, hemoptysis, shortness of breath and snoring.   Endocrine: Negative for heat intolerance and polyphagia.  Hematologic/Lymphatic: Negative for bleeding problem. Does not bruise/bleed easily.  Skin: Negative for flushing, nail changes, rash and suspicious lesions.  Musculoskeletal: Negative for arthritis, joint pain, muscle cramps, myalgias, neck pain and stiffness.  Gastrointestinal: Negative for abdominal pain, bowel incontinence, diarrhea and excessive appetite.  Genitourinary: Negative for decreased libido, genital sores and incomplete emptying.  Neurological: Negative for brief paralysis, focal weakness, headaches  and loss of balance.  Psychiatric/Behavioral: Negative for altered mental status, depression and suicidal ideas.  Allergic/Immunologic: Negative for HIV exposure and persistent infections.    EKGs/Labs/Other Studies Reviewed:    The following studies were reviewed today:   EKG: None today   Coronary CTA done on January 08, 2020. FINDINGS: Coronary calcium score: The patient's coronary artery calcium score is 0, which places the patient in the 0 percentile.  Coronary arteries: Normal coronary origins.  Right dominance.  Right Coronary Artery: Normal caliber vessel, gives rise to PDA. No significant plaque or stenosis.  Left Main Coronary Artery: Normal caliber vessel. No significant plaque or stenosis. Small ramus without significant plaque or stenosis  Left Anterior Descending Coronary Artery: Normal caliber vessel. No significant plaque or stenosis. There is artifact that precludes full evaluation of the mid LAD, and the distal LAD is small caliber. No clear pathology seen  but cannot be fully described on this study.  Left Circumflex Artery: Large caliber vessel. No significant plaque or stenosis.  Aorta: Normal size, 29 mm at the mid ascending aorta (level of the PA bifurcation) measured double oblique. No calcifications. No dissection.  Aortic Valve: No calcifications. Trileaflet.  Other findings:  Normal pulmonary vein drainage into the left atrium.  Normal left atrial appendage without a thrombus.  Normal size of the pulmonary artery.  There is a diverticulum in the mid LV septum near the inferior wall. No contrast seen in the RV to suggest VSD.  IMPRESSION: 1. No evidence of CAD, CADRADS = 0.  2. Coronary calcium score of 0. This was 0 percentile for age and sex matched control.  3. Normal coronary origin with right dominance.  4. There is a diverticulum in the mid LV septum near the inferior wall. No contrast seen in the RV to  suggest VSD.   Recent Labs: 03/19/2020: B Natriuretic Peptide 28.3; TSH 1.338 03/22/2020: Magnesium 2.5 04/25/2020: ALT 42; BUN 10; Creatinine 0.78; Hemoglobin 12.7; Platelets 217; Potassium 3.5; Sodium 140  Recent Lipid Panel    Component Value Date/Time   CHOL 170 03/19/2020 0518   CHOL 220 (H) 01/23/2019 1453   TRIG 78 03/19/2020 0518   HDL 25 (L) 03/19/2020 0518   HDL 73 01/23/2019 1453   CHOLHDL 6.8 03/19/2020 0518   VLDL 16 03/19/2020 0518   LDLCALC 129 (H) 03/19/2020 0518   LDLCALC 136 (H) 01/23/2019 1453    Physical Exam:    VS:  BP 124/78   Pulse 84   Ht 5' 3"  (1.6 m)   Wt 216 lb 1.6 oz (98 kg)   SpO2 98%   BMI 38.28 kg/m     Wt Readings from Last 3 Encounters:  05/13/20 216 lb 1.6 oz (98 kg)  04/25/20 220 lb 4.8 oz (99.9 kg)  04/10/20 222 lb 11.2 oz (101 kg)     GEN: Well nourished, well developed in no acute distress HEENT: Normal NECK: No JVD; No carotid bruits LYMPHATICS: No lymphadenopathy CARDIAC: S1S2 noted,RRR, no murmurs, rubs, gallops RESPIRATORY:  Clear to auscultation without rales, wheezing or rhonchi  ABDOMEN: Soft, non-tender, non-distended, +bowel sounds, no guarding. EXTREMITIES: No edema, No cyanosis, no clubbing MUSCULOSKELETAL:  No deformity  SKIN: Warm and dry NEUROLOGIC:  Alert and oriented x 3, non-focal PSYCHIATRIC:  Normal affect, good insight  ASSESSMENT:    1. Dyslipidemia   2. Morbid obesity ([place])   3. Left ventricular diverticulum    PLAN:     Still well from a cardiovascular standpoint.  Postoperatively she is happy she has lost some weight and she is looking forward to her weight loss journey.  I am happy for the patient.  No questions at this time.  The patient is in agreement with the above plan. The patient left the office in stable condition.  The patient will follow up as needed.   Medication Adjustments/Labs and Tests Ordered: Current medicines are reviewed at length with the patient today.  Concerns regarding  medicines are outlined above.  No orders of the defined types were placed in this encounter.  No orders of the defined types were placed in this encounter.   Patient Instructions  Medication Instructions:  No medication changes. *If you need a refill on your cardiac medications before your next appointment, please call your pharmacy*   Lab Work: None ordered If you have labs (blood work) drawn today and your tests are completely normal,  you will receive your results only by: Marland Kitchen MyChart Message (if you have MyChart) OR . A paper copy in the mail If you have any lab test that is abnormal or we need to change your treatment, we will call you to review the results.   Testing/Procedures: None ordered   Follow-Up: At [facility], you and your health needs are our priority.  As part of our continuing mission to provide you with exceptional heart care, we have created designated Provider Care Teams.  These Care Teams include your primary Cardiologist (physician) and Advanced Practice Providers (APPs -  Physician Assistants and Nurse Practitioners) who all work together to provide you with the care you need, when you need it.  We recommend signing up for the patient portal called "MyChart".  Sign up information is provided on this After Visit Summary.  MyChart is used to connect with patients for Virtual Visits (Telemedicine).  Patients are able to view lab/test results, encounter notes, upcoming appointments, etc.  Non-urgent messages can be sent to your provider as well.   To learn more about what you can do with MyChart, go to [email or web address].    Your next appointment:   As needed   The format for your next appointment:   In Person  Provider:   Berniece Salines, DO   Other Instructions NA     Adopting a Healthy Lifestyle.  Know what a healthy weight is for you (roughly BMI <25) and aim to maintain this   Aim for 7+ servings of fruits and vegetables daily   65-80+  fluid ounces of water or unsweet tea for healthy kidneys   Limit to max 1 drink of alcohol per day; avoid smoking/tobacco   Limit animal fats in diet for cholesterol and heart health - choose grass fed whenever available   Avoid highly processed foods, and foods high in saturated/trans fats   Aim for low stress - take time to unwind and care for your mental health   Aim for 150 min of moderate intensity exercise weekly for heart health, and weights twice weekly for bone health   Aim for 7-9 hours of sleep daily   When it comes to diets, agreement about the perfect plan isnt easy to find, even among the experts. Experts at the [place] developed an idea known as the Healthy Eating Plate. Just imagine a plate divided into logical, healthy portions.   The emphasis is on diet quality:   Load up on vegetables and fruits - one-half of your plate: Aim for color and variety, and remember that potatoes dont count.   Go for whole grains - one-quarter of your plate: Whole wheat, barley, wheat berries, quinoa, oats, brown rice, and foods made with them. If you want pasta, go with whole wheat pasta.   Protein power - one-quarter of your plate: Fish, chicken, beans, and nuts are all healthy, versatile protein sources. Limit red meat.   The diet, however, does go beyond the plate, offering a few other suggestions.   Use healthy plant oils, such as olive, canola, soy, corn, sunflower and peanut. Check the labels, and avoid partially hydrogenated oil, which have unhealthy trans fats.   If youre thirsty, drink water. Coffee and tea are good in moderation, but skip sugary drinks and limit milk and dairy products to one or two daily servings.   The type of carbohydrate in the diet is more important than the amount. Some sources of carbohydrates, such as vegetables,  fruits, whole grains, and beans-are healthier than others.   Finally, stay active  Signed, Berniece Salines, DO   05/13/2020 3:37 PM    [facility]

## 2020-05-21 ENCOUNTER — Telehealth: Payer: Self-pay | Admitting: Hematology and Oncology

## 2020-05-21 NOTE — Telephone Encounter (Signed)
Scheduled appt per 3/29 sch msg. Pt aware.

## 2020-05-22 ENCOUNTER — Other Ambulatory Visit: Payer: Self-pay

## 2020-05-22 ENCOUNTER — Inpatient Hospital Stay: Payer: 59

## 2020-05-22 DIAGNOSIS — D591 Autoimmune hemolytic anemia, unspecified: Secondary | ICD-10-CM

## 2020-05-22 DIAGNOSIS — D5919 Other autoimmune hemolytic anemia: Secondary | ICD-10-CM | POA: Diagnosis not present

## 2020-05-22 LAB — CBC WITH DIFFERENTIAL/PLATELET
Abs Immature Granulocytes: 0.02 10*3/uL (ref 0.00–0.07)
Basophils Absolute: 0 10*3/uL (ref 0.0–0.1)
Basophils Relative: 1 %
Eosinophils Absolute: 0.1 10*3/uL (ref 0.0–0.5)
Eosinophils Relative: 1 %
HCT: 40.1 % (ref 36.0–46.0)
Hemoglobin: 13.3 g/dL (ref 12.0–15.0)
Immature Granulocytes: 0 %
Lymphocytes Relative: 38 %
Lymphs Abs: 2.4 10*3/uL (ref 0.7–4.0)
MCH: 29.2 pg (ref 26.0–34.0)
MCHC: 33.2 g/dL (ref 30.0–36.0)
MCV: 87.9 fL (ref 80.0–100.0)
Monocytes Absolute: 0.4 10*3/uL (ref 0.1–1.0)
Monocytes Relative: 6 %
Neutro Abs: 3.4 10*3/uL (ref 1.7–7.7)
Neutrophils Relative %: 54 %
Platelets: 220 10*3/uL (ref 150–400)
RBC: 4.56 MIL/uL (ref 3.87–5.11)
RDW: 12.2 % (ref 11.5–15.5)
WBC: 6.3 10*3/uL (ref 4.0–10.5)
nRBC: 0 % (ref 0.0–0.2)

## 2020-05-22 LAB — CMP (CANCER CENTER ONLY)
ALT: 25 U/L (ref 0–44)
AST: 23 U/L (ref 15–41)
Albumin: 3.8 g/dL (ref 3.5–5.0)
Alkaline Phosphatase: 63 U/L (ref 38–126)
Anion gap: 13 (ref 5–15)
BUN: 11 mg/dL (ref 6–20)
CO2: 24 mmol/L (ref 22–32)
Calcium: 9 mg/dL (ref 8.9–10.3)
Chloride: 104 mmol/L (ref 98–111)
Creatinine: 0.79 mg/dL (ref 0.44–1.00)
GFR, Estimated: >60 mL/min (ref >60)
Glucose, Bld: 102 mg/dL — ABNORMAL HIGH (ref 70–99)
Potassium: 3.8 mmol/L (ref 3.5–5.1)
Sodium: 141 mmol/L (ref 135–145)
Total Bilirubin: 0.5 mg/dL (ref 0.3–1.2)
Total Protein: 6.8 g/dL (ref 6.5–8.1)

## 2020-05-22 LAB — RETICULOCYTES
Immature Retic Fract: 21 % — ABNORMAL HIGH (ref 2.3–15.9)
RBC.: 4.59 MIL/uL (ref 3.87–5.11)
Retic Count, Absolute: 61.5 10*3/uL (ref 19.0–186.0)
Retic Ct Pct: 1.3 % (ref 0.4–3.1)

## 2020-05-22 LAB — LACTATE DEHYDROGENASE: LDH: 222 U/L — ABNORMAL HIGH (ref 98–192)

## 2020-05-23 ENCOUNTER — Other Ambulatory Visit: Payer: Self-pay

## 2020-05-23 ENCOUNTER — Encounter: Payer: Self-pay | Admitting: Hematology and Oncology

## 2020-05-23 ENCOUNTER — Inpatient Hospital Stay (HOSPITAL_BASED_OUTPATIENT_CLINIC_OR_DEPARTMENT_OTHER): Payer: 59 | Admitting: Hematology and Oncology

## 2020-05-23 VITALS — BP 138/84 | HR 69 | Temp 96.8°F | Resp 19 | Ht 63.0 in | Wt 215.2 lb

## 2020-05-23 DIAGNOSIS — D591 Autoimmune hemolytic anemia, unspecified: Secondary | ICD-10-CM | POA: Diagnosis not present

## 2020-05-23 DIAGNOSIS — D5919 Other autoimmune hemolytic anemia: Secondary | ICD-10-CM | POA: Diagnosis not present

## 2020-05-23 NOTE — Progress Notes (Signed)
[place] PROGRESS NOTE  Patient Care Team: [name], DO as PCP - General (Family Medicine) Berniece Salines, DO as PCP - Cardiology (Cardiology)  CHIEF COMPLAINTS/PURPOSE OF CONSULTATION:  Fatigue  ASSESSMENT & PLAN:   No problem-specific Assessment & Plan notes found for this encounter.  1. Coombs positive autoimmune hemolytic anemia Rare cases of coombs positive AIHA have been reported with COVID 19. She is now in remission, most recent labs unremarkable except for mildly elevated LDH which may not be of clinical relevance given the other labs. I explained that LDH is an acute phase reactant and can be elevated from other causes of inflammation. PE unremarkable. She will RTC in 3 months with repeat labs She was encouraged to reach out to me with any new concerns.  2. Age appropriate cancer screening, up to date with mammogram Colonoscopy next yr.  FU with me in 3 months with repeat labs. Discussed lab results from today.  HISTORY OF PRESENTING ILLNESS:   Cindy Gilbert 45 y.o. female who is admitted with chief complaint of fatigue and SOB. She was found to have coombs positive AIHA, responded to prednisone. Given ongoing COVID 19 infection at the time of AIHA, she didn't get rituximab.  Interval history  She is doing very well, trying to take care of her priorities. She denies any complaints. NO B symptoms No change in breathing. Bowel habits better. No change in urinary habits. Rest of the pertinent 10 point ROS reviewed and negative.  MEDICAL HISTORY:  Past Medical History:  Diagnosis Date  . Allergy    Seasonal  . Anxiety 03/22/2018  . Autoimmune hemolytic anemia ([place]) 04/10/2020  . Chronic right hip pain 03/05/2020  . Controlled substance agreement signed 12/18/2016   For xanax  . COVID-19   . Depression    previously on lexapro, celexa,   . Dyslipidemia   . Heart murmur 12/27/2019  . Hip pain, chronic    Right  . HPV in female  04/09/2020  . Insomnia   . Mixed hyperlipidemia 12/06/2019  . Morbid obesity ([place]) 10/15/2019  . Multinodular goiter (nontoxic) 03/16/2019   Small nodules- no need for bx or follow up imaging 2020  . Ovarian cyst    left  . Prediabetes 03/05/2020  . Right knee pain   . S/P laparoscopic sleeve gastrectomy 03/05/2020  . Severe obesity (BMI >= 40) ([place]) 10/15/2019  . Vitamin D deficiency     SURGICAL HISTORY: Past Surgical History:  Procedure Laterality Date  . ADENOIDECTOMY    . CESAREAN SECTION  2001  . DILATION AND CURETTAGE OF UTERUS    . LAPAROSCOPIC GASTRIC SLEEVE RESECTION N/A 03/05/2020   Procedure: LAPAROSCOPIC GASTRIC SLEEVE RESECTION;  Surgeon: [name], MD;  Location: [facility];  Service: General;  Laterality: N/A;  . TONSILLECTOMY    . UPPER GI ENDOSCOPY N/A 03/05/2020   Procedure: UPPER GI ENDOSCOPY;  Surgeon: [name], MD;  Location: [facility];  Service: General;  Laterality: N/A;    SOCIAL HISTORY: Social History   Socioeconomic History  . Marital status: Single    Spouse name: Not on file  . Number of children: Not on file  . Years of education: Not on file  . Highest education level: Not on file  Occupational History  . Not on file  Tobacco Use  . Smoking status: Former Smoker    Packs/day: 0.25    Years: 10.00    Pack years: 2.50    Types: Cigarettes  Quit date: 02/23/2014    Years since quitting: 6.2  . Smokeless tobacco: Never Used  Vaping Use  . Vaping Use: Never used  Substance and Sexual Activity  . Alcohol use: Yes    Alcohol/week: 5.0 standard drinks    Types: 5 Glasses of wine per week  . Drug use: No  . Sexual activity: Not Currently    Birth control/protection: None  Other Topics Concern  . Not on file  Social History Narrative  . Not on file   Social Determinants of Health   Financial Resource Strain: Not on file  Food Insecurity: Not on file  Transportation Needs: Not on file  Physical Activity: Not on file  Stress: Not on file   Social Connections: Not on file  Intimate Partner Violence: Not on file    FAMILY HISTORY: Family History  Problem Relation Age of Onset  . Heart disease Father   . Stroke Father   . Heart attack Father   . Hypertension Father   . Atrial fibrillation Maternal Aunt   . Cancer Paternal Aunt        Breast  . Heart murmur Maternal Grandmother   . Asthma Paternal Grandmother   . Hypertension Mother   . Hyperlipidemia Mother     ALLERGIES:  is allergic to [place] [zolpidem tartrate], belviq [lorcaserin], bupropion, and cymbalta [duloxetine hcl].  MEDICATIONS:  Current Outpatient Medications  Medication Sig Dispense Refill  . ALPRAZolam (XANAX) 0.5 MG tablet Take 0.5-2 tablets (0.25-1 mg total) by mouth 2 (two) times daily as needed for anxiety. 60 tablet 1  . fluticasone (FLONASE) 50 MCG/ACT nasal spray Place 2 sprays into both nostrils daily. (Patient taking differently: Place 2 sprays into both nostrils daily as needed for allergies.) 16 g 12  . folic acid (FOLVITE) 1 MG tablet Take 1 tablet (1 mg total) by mouth daily. 30 tablet 1  . Multiple Vitamins-Minerals (MULTIVITAMIN WITH MINERALS) tablet Take 1 tablet by mouth daily.    . polyethylene glycol (MIRALAX / GLYCOLAX) 17 g packet Take 17 g by mouth daily as needed for mild constipation. 14 each 0  . Vitamin D, Ergocalciferol, (DRISDOL) 1.25 MG (50000 UNIT) CAPS capsule TAKE 1 CAPSULE (50,000 UNITS TOTAL) BY MOUTH EVERY 7 (SEVEN) DAYS. 12 capsule 2   No current facility-administered medications for this visit.     PHYSICAL EXAMINATION: ECOG PERFORMANCE STATUS: 0 - Asymptomatic  Vitals:   05/23/20 1426  BP: 138/84  Pulse: 69  Resp: 19  Temp: (!) 96.8 F (36 C)  SpO2: 99%   Filed Weights   05/23/20 1426  Weight: 215 lb 3.2 oz (97.6 kg)    GENERAL:alert, no distress and comfortable SKIN: No major concerns EYES: normal, conjunctiva are pink and non-injected, sclera clear OROPHARYNX:no exudate, no erythema and lips,  buccal mucosa, and tongue normal  LYMPH:  no palpable lymphadenopathy in the cervical, axillary or inguinal LUNGS: clear to auscultation and percussion with normal breathing effort HEART: regular rate & rhythm and no murmurs and no lower extremity edema ABDOMEN:abdomen soft, non-tender and normal bowel sounds Musculoskeletal:no cyanosis of digits and no clubbing  PSYCH: alert & oriented x 3 with fluent speech NEURO: no focal motor/sensory deficits  LABORATORY DATA:  I have reviewed the data as listed Lab Results  Component Value Date   WBC 6.3 05/22/2020   HGB 13.3 05/22/2020   HCT 40.1 05/22/2020   MCV 87.9 05/22/2020   PLT 220 05/22/2020   CMP reviewed no elevated bilirubin LDH mildly  elevated Retic count normal. CMP unremarkable. Retic count normal.  RADIOGRAPHIC STUDIES: I have personally reviewed the radiological images as listed and agreed with the findings in the report.  No results found. Reviewed labs from today.  Time  I spent 20 minutes in the care of this patient including H and P, review of labs, counseling and coordination of care.  Shailen Thielen M.D.

## 2020-05-25 ENCOUNTER — Encounter: Payer: Self-pay | Admitting: Hematology and Oncology

## 2020-06-03 ENCOUNTER — Ambulatory Visit: Payer: 59 | Admitting: Skilled Nursing Facility1

## 2020-06-03 ENCOUNTER — Encounter: Payer: 59 | Attending: General Surgery | Admitting: Skilled Nursing Facility1

## 2020-06-03 NOTE — Progress Notes (Signed)
Bariatric Nutrition Follow-Up Visit Medical Nutrition Therapy   NUTRITION ASSESSMENT   appt conducted via virtually pt verbalized understanding of limitations of this visit type, pt identified via DOB and name   Anthropometrics  Surgery date: 03/05/2020 Surgery type: sleeve Start weight at [facility]: 242 Weight today: virtual appt  Clinical  Medical hx: prediabetes Medications:  Labs:    Lifestyle & Dietary Hx  Pt states her anemia labs are WNL and is on a 3 month follow up plan with her pshycian.  Pt states she has been on a weight stall but sees where her body fat has decreased. Pt states she tried red wine which felt like it burned. Pt states she realized she needs to avoid eating out becasue the portions are so big. Pt states with the allergy to whey protein she has less options.   Dietitian offered some go to easy meals to help with feeling less overwhelmed. Pt state she does want to attend support group.   Estimated daily fluid intake: 48-64 water alone oz Estimated daily protein intake: 60+ g Supplements: multi, vitamin D (once a week), vitamin R00, folic acid, calcium Current average weekly physical activity: 5 days a Week trainer and BELT  24-Hr Dietary Recall First Meal: half caff + protein + collagen  Snack:  Second Meal: short ribs + scalloped potatoes or tuna salad + crackers Snack:  Third Meal: 6 shrimp + mushrooms + carrots or salmon + carrots + peppers + rice or chicken strips from [employer] + fries Snack:  Beverages: water, protein shakes, herbal tea, coffee, wine  Post-Op Goals/ Signs/ Symptoms Using straws: no Drinking while eating: no Chewing/swallowing difficulties: no Changes in vision: no Changes to mood/headaches: no Hair loss/changes to skin/nails: no Difficulty focusing/concentrating: no Sweating: no Dizziness/lightheadedness: no Palpitations: no  Carbonated/caffeinated beverages: no N/V/D/C/Gas: no Abdominal pain: no Dumping syndrome: no     NUTRITION DIAGNOSIS  Overweight/obesity ([place]-3.3) related to past poor dietary habits and physical inactivity as evidenced by completed bariatric surgery and following dietary guidelines for continued weight loss and healthy nutrition status.     NUTRITION INTERVENTION continued  Nutrition counseling (C-1) and education (E-2) to facilitate bariatric surgery goals, including: . The importance of consuming adequate calories as well as certain nutrients daily due to the body's need for essential vitamins, minerals, and fats . The importance of daily physical activity and to reach a goal of at least 150 minutes of moderate to vigorous physical activity weekly (or as directed by their physician) due to benefits such as increased musculature and improved lab values . The importance of intuitive eating specifically learning hunger-satiety cues and understanding the importance of learning a new body: The importance of mindful eating to avoid grazing behaviors   Goals: Follow the formula to put together healthy and nutritious meals  Handouts Previously Provided Include   Phase 3  Phase 4  Needs list  Feelings List  Things to do  Eating Out Options (Emailed)  Learning Style & Readiness for Change Teaching method utilized: Visual & Auditory  Demonstrated degree of understanding via: Teach Back  Readiness Level: Action Barriers to learning/adherence to lifestyle change: none identified  RD's Notes for Next Visit . Assess adherence to pt chosen goals   MONITORING & EVALUATION Dietary intake, weekly physical activity, body weight  Next Steps Patient is to follow-up

## 2020-07-15 ENCOUNTER — Encounter: Payer: Self-pay | Admitting: Family Medicine

## 2020-07-16 ENCOUNTER — Encounter: Payer: Self-pay | Admitting: Family Medicine

## 2020-07-16 ENCOUNTER — Other Ambulatory Visit: Payer: Self-pay

## 2020-07-16 ENCOUNTER — Ambulatory Visit (INDEPENDENT_AMBULATORY_CARE_PROVIDER_SITE_OTHER): Payer: 59 | Admitting: Family Medicine

## 2020-07-16 VITALS — BP 120/87 | HR 60 | Temp 98.2°F | Wt 203.0 lb

## 2020-07-16 DIAGNOSIS — R682 Dry mouth, unspecified: Secondary | ICD-10-CM

## 2020-07-16 DIAGNOSIS — Z9884 Bariatric surgery status: Secondary | ICD-10-CM

## 2020-07-16 LAB — URINALYSIS, ROUTINE W REFLEX MICROSCOPIC
Bilirubin, UA: NEGATIVE
Glucose, UA: NEGATIVE
Leukocytes,UA: NEGATIVE
Nitrite, UA: NEGATIVE
Protein,UA: NEGATIVE
RBC, UA: NEGATIVE
Specific Gravity, UA: 1.02 (ref 1.005–1.030)
Urobilinogen, Ur: 0.2 mg/dL (ref 0.2–1.0)
pH, UA: 6 (ref 5.0–7.5)

## 2020-07-16 NOTE — Progress Notes (Signed)
BP 120/87   Pulse 60   Temp 98.2 F (36.8 C)   Wt 203 lb (92.1 kg)   SpO2 98%   BMI 35.96 kg/m    Subjective:    Patient ID: Cindy Gilbert, female    DOB: December 15, 1975, 45 y.o.   MRN: [id number]  HPI: Cindy Gilbert is a 45 y.o. female  Chief Complaint  Patient presents with  . dry mouth    Patient states for about a week she has noticed her mouth very dry. Also has noticed her eyes looking dark, the white part of her eye. Patient states about 3 weeks ago her skin on her right arm and left leg seemed to hurt to the touch, lasted about a week.    Has been having very dry eyes, skin and mouth for about a week. She has been drinking 64+ oz of water without any benefit. She had a gastric sleeve in January and has not had any blood work done with them since then. She denies any break down of her skin or irritation. No oral ulcers. No red eye. No blurred vision. She notes that her skin was very sensitive for about a week about 3 weeks ago- totally gone now. She is otherwise feeling well with no other concerns or complaints at this time.   Relevant past medical, surgical, family and social history reviewed and updated as indicated. Interim medical history since our last visit reviewed. Allergies and medications reviewed and updated.  Review of Systems  Constitutional: Negative.   Respiratory: Negative.   Cardiovascular: Negative.   Gastrointestinal: Negative.   Musculoskeletal: Positive for arthralgias. Negative for back pain, gait problem, joint swelling, myalgias, neck pain and neck stiffness.  Skin: Negative.   Psychiatric/Behavioral: Negative.     Per HPI unless specifically indicated above     Objective:    BP 120/87   Pulse 60   Temp 98.2 F (36.8 C)   Wt 203 lb (92.1 kg)   SpO2 98%   BMI 35.96 kg/m   Wt Readings from Last 3 Encounters:  07/16/20 203 lb (92.1 kg)  05/23/20 215 lb 3.2 oz (97.6 kg)  05/13/20 216 lb 1.6 oz (98 kg)    Physical Exam Vitals and  nursing note reviewed.  Constitutional:      General: She is not in acute distress.    Appearance: Normal appearance. She is not ill-appearing, toxic-appearing or diaphoretic.  HENT:     Head: Normocephalic and atraumatic.     Right Ear: External ear normal.     Left Ear: External ear normal.     Nose: Nose normal.     Mouth/Throat:     Mouth: Mucous membranes are moist.     Pharynx: Oropharynx is clear.  Eyes:     General: No scleral icterus.       Right eye: No discharge.        Left eye: No discharge.     Extraocular Movements: Extraocular movements intact.     Conjunctiva/sclera: Conjunctivae normal.     Pupils: Pupils are equal, round, and reactive to light.  Cardiovascular:     Rate and Rhythm: Normal rate and regular rhythm.     Pulses: Normal pulses.     Heart sounds: Normal heart sounds. No murmur heard. No friction rub. No gallop.   Pulmonary:     Effort: Pulmonary effort is normal. No respiratory distress.     Breath sounds: Normal breath sounds. No stridor. No wheezing, rhonchi or  rales.  Chest:     Chest wall: No tenderness.  Musculoskeletal:        General: Normal range of motion.     Cervical back: Normal range of motion and neck supple.  Skin:    General: Skin is warm and dry.     Capillary Refill: Capillary refill takes less than 2 seconds.     Coloration: Skin is not jaundiced or pale.     Findings: No bruising, erythema, lesion or rash.  Neurological:     General: No focal deficit present.     Mental Status: She is alert and oriented to person, place, and time. Mental status is at baseline.  Psychiatric:        Mood and Affect: Mood normal.        Behavior: Behavior normal.        Thought Content: Thought content normal.        Judgment: Judgment normal.     Results for orders placed or performed in visit on 05/22/20  CBC with Differential/Platelet  Result Value Ref Range   WBC 6.3 4.0 - 10.5 K/uL   RBC 4.56 3.87 - 5.11 MIL/uL   Hemoglobin 13.3  12.0 - 15.0 g/dL   HCT 40.1 36.0 - 46.0 %   MCV 87.9 80.0 - 100.0 fL   MCH 29.2 26.0 - 34.0 pg   MCHC 33.2 30.0 - 36.0 g/dL   RDW 12.2 11.5 - 15.5 %   Platelets 220 150 - 400 K/uL   nRBC 0.0 0.0 - 0.2 %   Neutrophils Relative % 54 %   Neutro Abs 3.4 1.7 - 7.7 K/uL   Lymphocytes Relative 38 %   Lymphs Abs 2.4 0.7 - 4.0 K/uL   Monocytes Relative 6 %   Monocytes Absolute 0.4 0.1 - 1.0 K/uL   Eosinophils Relative 1 %   Eosinophils Absolute 0.1 0.0 - 0.5 K/uL   Basophils Relative 1 %   Basophils Absolute 0.0 0.0 - 0.1 K/uL   Immature Granulocytes 0 %   Abs Immature Granulocytes 0.02 0.00 - 0.07 K/uL  Reticulocytes  Result Value Ref Range   Retic Ct Pct 1.3 0.4 - 3.1 %   RBC. 4.59 3.87 - 5.11 MIL/uL   Retic Count, Absolute 61.5 19.0 - 186.0 K/uL   Immature Retic Fract 21.0 (H) 2.3 - 15.9 %  Lactate dehydrogenase  Result Value Ref Range   LDH 222 (H) 98 - 192 U/L  CMP (Cancer Center only)  Result Value Ref Range   Sodium 141 135 - 145 mmol/L   Potassium 3.8 3.5 - 5.1 mmol/L   Chloride 104 98 - 111 mmol/L   CO2 24 22 - 32 mmol/L   Glucose, Bld 102 (H) 70 - 99 mg/dL   BUN 11 6 - 20 mg/dL   Creatinine 0.79 0.44 - 1.00 mg/dL   Calcium 9.0 8.9 - 10.3 mg/dL   Total Protein 6.8 6.5 - 8.1 g/dL   Albumin 3.8 3.5 - 5.0 g/dL   AST 23 15 - 41 U/L   ALT 25 0 - 44 U/L   Alkaline Phosphatase 63 38 - 126 U/L   Total Bilirubin 0.5 0.3 - 1.2 mg/dL   GFR, Estimated >60 >60 mL/min   Anion gap 13 5 - 15      Assessment & Plan:   Problem List Items Addressed This Visit      Other   S/P laparoscopic sleeve gastrectomy    Will check her labs- she doesn't  think they've been done since her surgery. Await results. Treat as needed.       Relevant Orders   Comprehensive metabolic panel   VITAMIN D 25 Hydroxy (Vit-D Deficiency, Fractures)   Urinalysis, Routine w reflex microscopic   CBC with Differential/Platelet   Magnesium   Phosphorus   RA Qn+CCP(IgG/A)+SjoSSA+SjoSSB   B12   PTH,  Intact and Calcium   Vitamin K1, Serum   Vitamin B6   TSH    Other Visit Diagnoses    Oral dryness    -  Primary   Of unclear etiology. Recommended lemon drops. Will check labs. On no meds. Continue to monitor.    Relevant Orders   Comprehensive metabolic panel   VITAMIN D 25 Hydroxy (Vit-D Deficiency, Fractures)   Urinalysis, Routine w reflex microscopic   CBC with Differential/Platelet   Magnesium   Phosphorus   RA Qn+CCP(IgG/A)+SjoSSA+SjoSSB   B12   PTH, Intact and Calcium   Vitamin K1, Serum   Vitamin B6   TSH       Follow up plan: Return if symptoms worsen or fail to improve.

## 2020-07-16 NOTE — Patient Instructions (Signed)
Dry Eye  Dry eye, also called keratoconjunctivitis sicca, is dryness of the membranes surrounding the eye. It happens when there are not enough healthy, natural tears in the eyes. The eyes must remain moist at all times. A small amount of tears is constantly produced by the tear glands (lacrimal glands). These glands are located under the outside part of the upper eyelids. Dry eye can happen on its own or be a symptom of several conditions, such as rheumatoid arthritis, lupus, or Sjgren's syndrome. Dry eye may be mild to severe. What are the causes? This condition may be caused by:  Not making enough tears (aqueous tear-deficient dry eyes).  Tears evaporating from the eyes too quickly (evaporative dry eyes). This is when there is an abnormality in the quality of your tears. This abnormality causes your tears to evaporate so quickly that the eyes cannot be kept moist. What increases the risk? You are more likely to develop this condition if you:  Are a woman, especially if you have gone through menopause.  Live in a dry climate.  Live in a dusty or smoky area.  Take certain medicines, such as: ? Anti-allergy medicines (antihistamines). ? Blood pressure medicines (antihypertensives). ? Birth control pills (oral contraceptives). ? Laxatives. ? Tranquilizers.  Have a history of refractive eye surgery, such as LASIK.  Have a history of long-term contact lens use. What are the signs or symptoms? Symptoms of this condition include:  Irritation.  Itchiness.  Redness.  Burning.  Inflammation of the eyelids.  Feeling as though something is stuck in the eye.  Light sensitivity.  Increased sensitivity and discomfort when wearing contact lenses.  Vision that varies throughout the day.  Occasional excessive tearing.   How is this diagnosed? This condition is diagnosed based on your symptoms, your medical history, and an eye exam.  Your health care provider may look at your  eye using a microscope and may put dyes in your eye to check the health of the surface of your eye.  You may have tests, such as a test to evaluate your tear production (Schirmer test). ? During this test, a small strip of special paper is gently pressed into the inner corner of your eye. ? Your tear production is measured by how much of the paper is moistened by your tears during a set amount of time. You may be referred to a health care provider who specializes in eyes and eyesight (ophthalmologist). How is this treated? Treatment for this condition depends on the severity. Mild cases are often treated at home. To help relieve your symptoms, your health care provider may recommend eye drops, which are also called artificial tears.  If your condition is severe, treatment may include: ? Prescription eye drops. ? Over-the-counter or prescription ointments to moisten your eyes. ? Minor surgery to place plugs into the tear ducts. This keep tears from draining so that tears can stay on the surface of the eye longer. ? Medicines to reduce inflammation of the eyelids. ? Taking an omega-3 fatty acid nutritional supplement. Follow these instructions at home:  Take or apply over-the-counter and prescription medicines only as told by your health care provider. This includes eye drops.  If directed, apply a warm compress to your eyes to help reduce inflammation. Place a towel over your eyes and gently press the warm compress over your eyes for about 5 minutes, or as long as told by your health care provider.  Drink plenty of fluids to stay well hydrated.  If possible, avoid dry, drafty environments.  Wear sunglasses when outdoors to protect your eyes from the sun and wind.  Use a humidifier at home to increase moisture in the air.  Remember to blink often when reading or using the computer for long periods.  If you wear contact lenses, remove them regularly to give your eyes a break. Always  remove contacts before sleeping.  Have a yearly eye exam and vision test.  Keep all follow-up visits as told by your health care provider. This is important. Contact a health care provider if:  You have eye pain.  You have pus-like fluid coming from your eye.  Your symptoms get worse or do not improve with treatment. Get help right away if:  Your vision suddenly changes. Summary  Dry eye is dryness of the membranes surrounding the eye.  Dry eye can happen on its own or be a symptom of several conditions, such as rheumatoid arthritis, lupus, or Sjgren's syndrome.  This condition is diagnosed based on your symptoms, your medical history, and an eye exam.  Treatment for this condition depends on the severity. Mild cases are often treated at home. To help relieve your symptoms, your health care provider may recommend eye drops, which are also called artificial tears. This information is not intended to replace advice given to you by your health care provider. Make sure you discuss any questions you have with your health care provider. Document Revised: 08/03/2017 Document Reviewed: 08/03/2017 Elsevier Patient Education  [place].

## 2020-07-16 NOTE — Assessment & Plan Note (Signed)
Will check her labs- she doesn't think they've been done since her surgery. Await results. Treat as needed.

## 2020-07-26 LAB — VITAMIN K1, SERUM: VITAMIN K1: 0.34 ng/mL (ref 0.10–2.20)

## 2020-07-30 LAB — CBC WITH DIFFERENTIAL/PLATELET
Basophils Absolute: 0 10*3/uL (ref 0.0–0.2)
Basos: 1 %
EOS (ABSOLUTE): 0.1 10*3/uL (ref 0.0–0.4)
Eos: 2 %
Hematocrit: 48 % — ABNORMAL HIGH (ref 34.0–46.6)
Hemoglobin: 15.9 g/dL (ref 11.1–15.9)
Immature Grans (Abs): 0 10*3/uL (ref 0.0–0.1)
Immature Granulocytes: 0 %
Lymphocytes Absolute: 2.5 10*3/uL (ref 0.7–3.1)
Lymphs: 53 %
MCH: 28.1 pg (ref 26.6–33.0)
MCHC: 33.1 g/dL (ref 31.5–35.7)
MCV: 85 fL (ref 79–97)
Monocytes Absolute: 0.3 10*3/uL (ref 0.1–0.9)
Monocytes: 7 %
Neutrophils Absolute: 1.8 10*3/uL (ref 1.4–7.0)
Neutrophils: 37 %
Platelets: 220 10*3/uL (ref 150–450)
RBC: 5.66 x10E6/uL — ABNORMAL HIGH (ref 3.77–5.28)
RDW: 13.2 % (ref 11.7–15.4)
WBC: 4.8 10*3/uL (ref 3.4–10.8)

## 2020-07-30 LAB — RA QN+CCP(IGG/A)+SJOSSA+SJOSSB
Cyclic Citrullin Peptide Ab: 1 units (ref 0–19)
ENA SSA (RO) Ab: <0.2 AI (ref 0.0–0.9)
ENA SSB (LA) Ab: <0.2 AI (ref 0.0–0.9)
Rheumatoid fact SerPl-aCnc: 10.2 IU/mL (ref <14.0)

## 2020-07-30 LAB — VITAMIN D 25 HYDROXY (VIT D DEFICIENCY, FRACTURES): Vit D, 25-Hydroxy: 51.1 ng/mL (ref 30.0–100.0)

## 2020-07-30 LAB — VITAMIN B12: Vitamin B-12: 457 pg/mL (ref 232–1245)

## 2020-07-30 LAB — COMPREHENSIVE METABOLIC PANEL
ALT: 17 IU/L (ref 0–32)
AST: 19 IU/L (ref 0–40)
Albumin/Globulin Ratio: 1.7 (ref 1.2–2.2)
Albumin: 4.3 g/dL (ref 3.8–4.8)
Alkaline Phosphatase: 91 IU/L (ref 44–121)
BUN/Creatinine Ratio: 14 (ref 9–23)
BUN: 11 mg/dL (ref 6–24)
Bilirubin Total: 0.7 mg/dL (ref 0.0–1.2)
CO2: 21 mmol/L (ref 20–29)
Calcium: 9.8 mg/dL (ref 8.7–10.2)
Chloride: 98 mmol/L (ref 96–106)
Creatinine, Ser: 0.81 mg/dL (ref 0.57–1.00)
Globulin, Total: 2.5 g/dL (ref 1.5–4.5)
Glucose: 85 mg/dL (ref 65–99)
Potassium: 4 mmol/L (ref 3.5–5.2)
Sodium: 139 mmol/L (ref 134–144)
Total Protein: 6.8 g/dL (ref 6.0–8.5)
eGFR: 92 mL/min/{1.73_m2} (ref >59)

## 2020-07-30 LAB — PTH, INTACT AND CALCIUM: PTH: 30 pg/mL (ref 15–65)

## 2020-07-30 LAB — TSH: TSH: 1.82 u[IU]/mL (ref 0.450–4.500)

## 2020-07-30 LAB — VITAMIN B6: Vitamin B6: 21.1 ug/L (ref 3.4–65.2)

## 2020-07-30 LAB — MAGNESIUM: Magnesium: 1.9 mg/dL (ref 1.6–2.3)

## 2020-07-30 LAB — PHOSPHORUS: Phosphorus: 4.2 mg/dL (ref 3.0–4.3)

## 2020-08-29 ENCOUNTER — Inpatient Hospital Stay (HOSPITAL_BASED_OUTPATIENT_CLINIC_OR_DEPARTMENT_OTHER): Payer: 59 | Admitting: Hematology and Oncology

## 2020-08-29 ENCOUNTER — Inpatient Hospital Stay: Payer: 59 | Attending: Hematology and Oncology

## 2020-08-29 ENCOUNTER — Other Ambulatory Visit: Payer: Self-pay

## 2020-08-29 ENCOUNTER — Encounter: Payer: Self-pay | Admitting: Hematology and Oncology

## 2020-08-29 VITALS — BP 135/79 | HR 71 | Temp 98.1°F | Resp 16 | Ht 63.0 in | Wt 195.6 lb

## 2020-08-29 DIAGNOSIS — Z87891 Personal history of nicotine dependence: Secondary | ICD-10-CM | POA: Insufficient documentation

## 2020-08-29 DIAGNOSIS — D591 Autoimmune hemolytic anemia, unspecified: Secondary | ICD-10-CM | POA: Insufficient documentation

## 2020-08-29 LAB — CBC WITH DIFFERENTIAL/PLATELET
Abs Immature Granulocytes: 0 10*3/uL (ref 0.00–0.07)
Basophils Absolute: 0 10*3/uL (ref 0.0–0.1)
Basophils Relative: 0 %
Eosinophils Absolute: 0.1 10*3/uL (ref 0.0–0.5)
Eosinophils Relative: 2 %
HCT: 42.9 % (ref 36.0–46.0)
Hemoglobin: 14.3 g/dL (ref 12.0–15.0)
Immature Granulocytes: 0 %
Lymphocytes Relative: 55 %
Lymphs Abs: 2.9 10*3/uL (ref 0.7–4.0)
MCH: 27.4 pg (ref 26.0–34.0)
MCHC: 33.3 g/dL (ref 30.0–36.0)
MCV: 82.2 fL (ref 80.0–100.0)
Monocytes Absolute: 0.3 10*3/uL (ref 0.1–1.0)
Monocytes Relative: 6 %
Neutro Abs: 2 10*3/uL (ref 1.7–7.7)
Neutrophils Relative %: 37 %
Platelets: 183 10*3/uL (ref 150–400)
RBC: 5.22 MIL/uL — ABNORMAL HIGH (ref 3.87–5.11)
RDW: 14.3 % (ref 11.5–15.5)
WBC: 5.2 10*3/uL (ref 4.0–10.5)
nRBC: 0 % (ref 0.0–0.2)

## 2020-08-29 LAB — CMP (CANCER CENTER ONLY)
ALT: 16 U/L (ref 0–44)
AST: 19 U/L (ref 15–41)
Albumin: 3.6 g/dL (ref 3.5–5.0)
Alkaline Phosphatase: 82 U/L (ref 38–126)
Anion gap: 12 (ref 5–15)
BUN: 14 mg/dL (ref 6–20)
CO2: 26 mmol/L (ref 22–32)
Calcium: 9.7 mg/dL (ref 8.9–10.3)
Chloride: 104 mmol/L (ref 98–111)
Creatinine: 0.82 mg/dL (ref 0.44–1.00)
GFR, Estimated: >60 mL/min (ref >60)
Glucose, Bld: 82 mg/dL (ref 70–99)
Potassium: 3.6 mmol/L (ref 3.5–5.1)
Sodium: 142 mmol/L (ref 135–145)
Total Bilirubin: 1 mg/dL (ref 0.3–1.2)
Total Protein: 7 g/dL (ref 6.5–8.1)

## 2020-08-29 LAB — RETICULOCYTES
Immature Retic Fract: 13.6 % (ref 2.3–15.9)
RBC.: 5.13 MIL/uL — ABNORMAL HIGH (ref 3.87–5.11)
Retic Count, Absolute: 46.7 10*3/uL (ref 19.0–186.0)
Retic Ct Pct: 0.9 % (ref 0.4–3.1)

## 2020-08-29 LAB — LACTATE DEHYDROGENASE: LDH: 172 U/L (ref 98–192)

## 2020-08-29 NOTE — Progress Notes (Signed)
[facility] PROGRESS NOTE  Patient Care Team: [name], DO as PCP - General (Family Medicine) Berniece Salines, DO as PCP - Cardiology (Cardiology)  CHIEF COMPLAINTS/PURPOSE OF CONSULTATION:  Fatigue  ASSESSMENT & PLAN:   No problem-specific Assessment & Plan notes found for this encounter.  1. Coombs positive autoimmune hemolytic anemia Rare cases of coombs positive AIHA have been reported with COVID 19. She continues to be in remission, no concerning review of systems or physical examination findings.  CBC unremarkable with normal hemoglobin and reticulocyte count. At this time, have recommended that patient contact us with any urgent questions or concerns, otherwise return to clinic for follow-up in 6 months with repeat labs.  2. Age appropriate cancer screening, up to date with mammogram Colonoscopy next yr.  FU with me in 6 months with repeat labs. Discussed lab results from today.  HISTORY OF PRESENTING ILLNESS:   Cindy Gilbert 45 y.o. female who is admitted with chief complaint of fatigue and SOB. She was found to have coombs positive AIHA, responded to prednisone. Given ongoing COVID 19 infection at the time of AIHA, she didn't get rituximab.  Interval history  Patient is here for follow-up.  She has been doing really well.  No B symptoms.  No change in breathing, bowel habits or urinary habits.  No interim hospitalizations or infections.  No new medications.  Rest of 10 point ROS reviewed and negative.   MEDICAL HISTORY:  Past Medical History:  Diagnosis Date   Allergy    Seasonal   Anxiety 03/22/2018   Autoimmune hemolytic anemia ([place]) 04/10/2020   Chronic right hip pain 03/05/2020   Controlled substance agreement signed 12/18/2016   For xanax   COVID-19    Depression    previously on lexapro, celexa,    Dyslipidemia    Heart murmur 12/27/2019   Hip pain, chronic    Right   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/09/2020   Insomnia    Mixed hyperlipidemia  12/06/2019   Morbid obesity ([place]) 10/15/2019   Multinodular goiter (nontoxic) 03/16/2019   Small nodules- no need for bx or follow up imaging 2020   Ovarian cyst    left   Prediabetes 03/05/2020   Right knee pain    S/P laparoscopic sleeve gastrectomy 03/05/2020   Severe obesity (BMI >= 40) ([place]) 10/15/2019   Vitamin D deficiency     SURGICAL HISTORY: Past Surgical History:  Procedure Laterality Date   ADENOIDECTOMY     CESAREAN SECTION  2001   DILATION AND CURETTAGE OF UTERUS     LAPAROSCOPIC GASTRIC SLEEVE RESECTION N/A 03/05/2020   Procedure: LAPAROSCOPIC GASTRIC SLEEVE RESECTION;  Surgeon: [name], MD;  Location: [facility];  Service: General;  Laterality: N/A;   TONSILLECTOMY     UPPER GI ENDOSCOPY N/A 03/05/2020   Procedure: UPPER GI ENDOSCOPY;  Surgeon: [name], MD;  Location: [facility];  Service: General;  Laterality: N/A;    SOCIAL HISTORY: Social History   Socioeconomic History   Marital status: Single    Spouse name: Not on file   Number of children: Not on file   Years of education: Not on file   Highest education level: Not on file  Occupational History   Not on file  Tobacco Use   Smoking status: Former    Packs/day: 0.25    Years: 10.00    Pack years: 2.50    Types: Cigarettes    Quit date: 02/23/2014    Years since quitting: 6.5  Smokeless tobacco: Never  Vaping Use   Vaping Use: Never used  Substance and Sexual Activity   Alcohol use: Yes    Alcohol/week: 5.0 standard drinks    Types: 5 Glasses of wine per week   Drug use: No   Sexual activity: Not Currently    Birth control/protection: None  Other Topics Concern   Not on file  Social History Narrative   Not on file   Social Determinants of Health   Financial Resource Strain: Not on file  Food Insecurity: Not on file  Transportation Needs: Not on file  Physical Activity: Not on file  Stress: Not on file  Social Connections: Not on file  Intimate Partner Violence: Not on file    FAMILY  HISTORY: Family History  Problem Relation Age of Onset   Heart disease Father    Stroke Father    Heart attack Father    Hypertension Father    Atrial fibrillation Maternal Aunt    Cancer Paternal Aunt        Breast   Heart murmur Maternal Grandmother    Asthma Paternal Grandmother    Hypertension Mother    Hyperlipidemia Mother     ALLERGIES:  is allergic to [place] [zolpidem tartrate], belviq [lorcaserin], bupropion, and cymbalta [duloxetine hcl].  MEDICATIONS:  Current Outpatient Medications  Medication Sig Dispense Refill   ALPRAZolam (XANAX) 0.5 MG tablet Take 0.5-2 tablets (0.25-1 mg total) by mouth 2 (two) times daily as needed for anxiety. 60 tablet 1   BIOTIN PO Take by mouth.     Calcium Carb-Cholecalciferol (CALCIUM 500 + D PO) Take 500 mg by mouth 3 (three) times daily.     Cyanocobalamin (B-12 PO) Take by mouth.     fluticasone (FLONASE) 50 MCG/ACT nasal spray Place 2 sprays into both nostrils daily. (Patient taking differently: Place 2 sprays into both nostrils daily as needed for allergies.) 16 g 12   Multiple Vitamins-Minerals (MULTIVITAMIN WITH MINERALS) tablet Take 1 tablet by mouth daily.     No current facility-administered medications for this visit.     PHYSICAL EXAMINATION: ECOG PERFORMANCE STATUS: 0 - Asymptomatic  Vitals:   08/29/20 1450  BP: 135/79  Pulse: 71  Resp: 16  Temp: 98.1 F (36.7 C)  SpO2: 100%   Filed Weights   08/29/20 1450  Weight: 195 lb 9.6 oz (88.7 kg)    GENERAL:alert, no distress and comfortable SKIN: No major concerns EYES: normal, conjunctiva are pink and non-injected, sclera clear OROPHARYNX:no exudate, no erythema and lips, buccal mucosa, and tongue normal  LYMPH:  no palpable lymphadenopathy in the cervical, axillary or inguinal LUNGS: clear to auscultation and percussion with normal breathing effort HEART: regular rate & rhythm and no murmurs and no lower extremity edema ABDOMEN:abdomen soft, non-tender and  normal bowel sounds Musculoskeletal:no cyanosis of digits and no clubbing  PSYCH: alert & oriented x 3 with fluent speech NEURO: no focal motor/sensory deficits  LABORATORY DATA:  I have reviewed the data as listed Lab Results  Component Value Date   WBC 5.2 08/29/2020   HGB 14.3 08/29/2020   HCT 42.9 08/29/2020   MCV 82.2 08/29/2020   PLT 183 08/29/2020   CMP reviewed no elevated bilirubin LDH normal retic count normal CBC normal  RADIOGRAPHIC STUDIES: I have personally reviewed the radiological images as listed and agreed with the findings in the report.  No results found. Reviewed labs from today.  Time  I spent 20 minutes in the care of this patient  including H and P, review of labs, counseling and coordination of care. Please refer to the above-mentioned assessment and plan for recommendations discussion.  Suzanna Zahn M.D.

## 2020-12-31 ENCOUNTER — Other Ambulatory Visit: Payer: Self-pay

## 2020-12-31 DIAGNOSIS — D591 Autoimmune hemolytic anemia, unspecified: Secondary | ICD-10-CM

## 2021-01-07 ENCOUNTER — Inpatient Hospital Stay: Payer: 59 | Attending: Hematology and Oncology

## 2021-01-07 ENCOUNTER — Other Ambulatory Visit: Payer: Self-pay

## 2021-01-07 DIAGNOSIS — D591 Autoimmune hemolytic anemia, unspecified: Secondary | ICD-10-CM | POA: Insufficient documentation

## 2021-01-07 LAB — CBC WITH DIFFERENTIAL (CANCER CENTER ONLY)
Abs Immature Granulocytes: 0.01 10*3/uL (ref 0.00–0.07)
Basophils Absolute: 0 10*3/uL (ref 0.0–0.1)
Basophils Relative: 0 %
Eosinophils Absolute: 0.1 10*3/uL (ref 0.0–0.5)
Eosinophils Relative: 1 %
HCT: 43.8 % (ref 36.0–46.0)
Hemoglobin: 14.8 g/dL (ref 12.0–15.0)
Immature Granulocytes: 0 %
Lymphocytes Relative: 42 %
Lymphs Abs: 3 10*3/uL (ref 0.7–4.0)
MCH: 28.5 pg (ref 26.0–34.0)
MCHC: 33.8 g/dL (ref 30.0–36.0)
MCV: 84.2 fL (ref 80.0–100.0)
Monocytes Absolute: 0.5 10*3/uL (ref 0.1–1.0)
Monocytes Relative: 7 %
Neutro Abs: 3.4 10*3/uL (ref 1.7–7.7)
Neutrophils Relative %: 50 %
Platelet Count: 252 10*3/uL (ref 150–400)
RBC: 5.2 MIL/uL — ABNORMAL HIGH (ref 3.87–5.11)
RDW: 13 % (ref 11.5–15.5)
WBC Count: 7 10*3/uL (ref 4.0–10.5)
nRBC: 0 % (ref 0.0–0.2)

## 2021-01-07 LAB — CMP (CANCER CENTER ONLY)
ALT: 14 U/L (ref 0–44)
AST: 17 U/L (ref 15–41)
Albumin: 4.3 g/dL (ref 3.5–5.0)
Alkaline Phosphatase: 98 U/L (ref 38–126)
Anion gap: 11 (ref 5–15)
BUN: 17 mg/dL (ref 6–20)
CO2: 26 mmol/L (ref 22–32)
Calcium: 9.8 mg/dL (ref 8.9–10.3)
Chloride: 106 mmol/L (ref 98–111)
Creatinine: 0.94 mg/dL (ref 0.44–1.00)
GFR, Estimated: >60 mL/min (ref >60)
Glucose, Bld: 93 mg/dL (ref 70–99)
Potassium: 3.6 mmol/L (ref 3.5–5.1)
Sodium: 143 mmol/L (ref 135–145)
Total Bilirubin: 1 mg/dL (ref 0.3–1.2)
Total Protein: 7.8 g/dL (ref 6.5–8.1)

## 2021-01-07 LAB — RETIC PANEL
Immature Retic Fract: 11.7 % (ref 2.3–15.9)
RBC.: 5.34 MIL/uL — ABNORMAL HIGH (ref 3.87–5.11)
Retic Count, Absolute: 64.1 10*3/uL (ref 19.0–186.0)
Retic Ct Pct: 1.2 % (ref 0.4–3.1)
Reticulocyte Hemoglobin: 32.7 pg (ref >27.9)

## 2021-01-07 LAB — LACTATE DEHYDROGENASE: LDH: 235 U/L — ABNORMAL HIGH (ref 98–192)

## 2021-01-14 ENCOUNTER — Encounter: Payer: Self-pay | Admitting: Family Medicine

## 2021-01-14 ENCOUNTER — Ambulatory Visit (INDEPENDENT_AMBULATORY_CARE_PROVIDER_SITE_OTHER): Payer: 59 | Admitting: Family Medicine

## 2021-01-14 ENCOUNTER — Other Ambulatory Visit: Payer: Self-pay

## 2021-01-14 VITALS — BP 121/83 | HR 62 | Ht 62.75 in

## 2021-01-14 DIAGNOSIS — Z1211 Encounter for screening for malignant neoplasm of colon: Secondary | ICD-10-CM

## 2021-01-14 DIAGNOSIS — F320 Major depressive disorder, single episode, mild: Secondary | ICD-10-CM

## 2021-01-14 DIAGNOSIS — R7303 Prediabetes: Secondary | ICD-10-CM

## 2021-01-14 DIAGNOSIS — E042 Nontoxic multinodular goiter: Secondary | ICD-10-CM | POA: Diagnosis not present

## 2021-01-14 DIAGNOSIS — R197 Diarrhea, unspecified: Secondary | ICD-10-CM

## 2021-01-14 DIAGNOSIS — Z0000 Encounter for general adult medical examination without abnormal findings: Secondary | ICD-10-CM | POA: Diagnosis not present

## 2021-01-14 DIAGNOSIS — F419 Anxiety disorder, unspecified: Secondary | ICD-10-CM

## 2021-01-14 DIAGNOSIS — E782 Mixed hyperlipidemia: Secondary | ICD-10-CM

## 2021-01-14 DIAGNOSIS — D591 Autoimmune hemolytic anemia, unspecified: Secondary | ICD-10-CM

## 2021-01-14 DIAGNOSIS — E559 Vitamin D deficiency, unspecified: Secondary | ICD-10-CM

## 2021-01-14 LAB — URINALYSIS, ROUTINE W REFLEX MICROSCOPIC
Bilirubin, UA: NEGATIVE
Glucose, UA: NEGATIVE
Ketones, UA: NEGATIVE
Nitrite, UA: NEGATIVE
Protein,UA: NEGATIVE
RBC, UA: NEGATIVE
Specific Gravity, UA: 1.025 (ref 1.005–1.030)
Urobilinogen, Ur: 0.2 mg/dL (ref 0.2–1.0)
pH, UA: 5.5 (ref 5.0–7.5)

## 2021-01-14 LAB — MICROSCOPIC EXAMINATION: RBC, Urine: NONE SEEN /hpf (ref 0–2)

## 2021-01-14 LAB — BAYER DCA HB A1C WAIVED: HB A1C (BAYER DCA - WAIVED): 5.1 % (ref 4.8–5.6)

## 2021-01-14 LAB — MICROALBUMIN, URINE WAIVED
Creatinine, Urine Waived: 200 mg/dL (ref 10–300)
Microalb, Ur Waived: 30 mg/L — ABNORMAL HIGH (ref 0–19)
Microalb/Creat Ratio: <30 mg/g (ref <30)

## 2021-01-14 MED ORDER — METRONIDAZOLE 500 MG PO TABS: 500.0000 mg | ORAL_TABLET | Freq: Two times a day (BID) | ORAL | 0 refills | Status: DC

## 2021-01-14 MED ORDER — FLUTICASONE PROPIONATE 50 MCG/ACT NA SUSP: 2.0000 | Freq: Every day | NASAL | 12 refills | Status: DC | PRN

## 2021-01-14 MED ORDER — ALPRAZOLAM 0.5 MG PO TABS: 0.2500 mg | ORAL_TABLET | Freq: Two times a day (BID) | ORAL | 1 refills | Status: DC | PRN

## 2021-01-14 NOTE — Patient Instructions (Signed)
Evening [employer]

## 2021-01-14 NOTE — Assessment & Plan Note (Signed)
Rechecking labs today. Await results. Treat as needed.  °

## 2021-01-14 NOTE — Assessment & Plan Note (Signed)
Doing well. Following with hematology. Call with any concerns.

## 2021-01-14 NOTE — Assessment & Plan Note (Signed)
Doing well off medicine. Continue to monitor. Call with any concerns.

## 2021-01-14 NOTE — Assessment & Plan Note (Signed)
Resolved with weight loss s/p surgery. Call with any concerns. Continue to monitor.

## 2021-01-14 NOTE — Progress Notes (Signed)
BP 121/83   Pulse 62   Ht 5' 2.75" (1.594 m)   SpO2 98%   BMI 34.93 kg/m    Subjective:    Patient ID: Cindy Gilbert, female    DOB: 1975-05-15, 45 y.o.   MRN: [id number]  HPI: Cindy Gilbert is a 45 y.o. female presenting on 01/14/2021 for comprehensive medical examination. Current medical complaints include:  ABDOMINAL ISSUES Duration: 2 weeks- just got back from [employer]: urgency and diarrhea Location: diffuse  Severity: no pain  Radiation: none Frequency: with eating Treatments attempted: none Constipation: no Diarrhea: yes Episodes of diarrhea/day: 3+, times down to 2 Mucous in the stool: no Heartburn: no Bloating:no Flatulence: yes Nausea: no Vomiting: no Episodes of vomit/day: Melena or hematochezia: no Rash: no Jaundice: no Fever: no Weight loss: yes  ANXIETY/STRESS Duration: chronic Status:stable Anxious mood: yes  Excessive worrying: yes Irritability: no  Sweating: no Nausea: no Palpitations:no Hyperventilation: no Panic attacks: no Agoraphobia: no  Obscessions/compulsions: no Depressed mood: no Depression screen [facility] 2/9 01/14/2021 12/28/2019 07/28/2019 01/23/2019 03/22/2018  Decreased Interest 0 0 0 1 0  Down, Depressed, Hopeless 0 0 1 1 1   PHQ - 2 Score 0 0 1 2 1   Altered sleeping 3 - 1 0 2  Tired, decreased energy 1 - 1 2 0  Change in appetite 0 - 0 1 0  Feeling bad or failure about yourself  0 - 1 1 1   Trouble concentrating 0 - 2 1 0  Moving slowly or fidgety/restless 0 - 0 0 0  Suicidal thoughts 0 - 0 0 0  PHQ-9 Score 4 - 6 7 4   Difficult doing work/chores - - Not difficult at all Not difficult at all Somewhat difficult   Anhedonia: no Weight changes: no Insomnia: no   Hypersomnia: no Fatigue/loss of energy: no Feelings of worthlessness: no Feelings of guilt: no Impaired concentration/indecisiveness: no Suicidal ideations: no  Crying spells: no Recent Stressors/Life Changes: no   Relationship problems: no    Family stress: yes     Financial stress: yes    Job stress: yes    Recent death/loss: no  Menopausal Symptoms: yes  Depression Screen done today and results listed below:  Depression screen [facility] 2/9 01/14/2021 12/28/2019 07/28/2019 01/23/2019 03/22/2018  Decreased Interest 0 0 0 1 0  Down, Depressed, Hopeless 0 0 1 1 1   PHQ - 2 Score 0 0 1 2 1   Altered sleeping 3 - 1 0 2  Tired, decreased energy 1 - 1 2 0  Change in appetite 0 - 0 1 0  Feeling bad or failure about yourself  0 - 1 1 1   Trouble concentrating 0 - 2 1 0  Moving slowly or fidgety/restless 0 - 0 0 0  Suicidal thoughts 0 - 0 0 0  PHQ-9 Score 4 - 6 7 4   Difficult doing work/chores - - Not difficult at all Not difficult at all Somewhat difficult    Past Medical History:  Past Medical History:  Diagnosis Date   Allergy    Seasonal   Anxiety 03/22/2018   Autoimmune hemolytic anemia ([place]) 04/10/2020   Chronic right hip pain 03/05/2020   Controlled substance agreement signed 12/18/2016   For xanax   COVID-19    Depression    previously on lexapro, celexa,    Dyslipidemia    Heart murmur 12/27/2019   Hip pain, chronic    Right   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/09/2020   Insomnia    Mixed hyperlipidemia  12/06/2019   Morbid obesity ([place]) 10/15/2019   Multinodular goiter (nontoxic) 03/16/2019   Small nodules- no need for bx or follow up imaging 2020   Ovarian cyst    left   Prediabetes 03/05/2020   Right knee pain    S/P laparoscopic sleeve gastrectomy 03/05/2020   Severe obesity (BMI >= 40) ([place]) 10/15/2019   Vitamin D deficiency     Surgical History:  Past Surgical History:  Procedure Laterality Date   ADENOIDECTOMY     CESAREAN SECTION  2001   DILATION AND CURETTAGE OF UTERUS     LAPAROSCOPIC GASTRIC SLEEVE RESECTION N/A 03/05/2020   Procedure: LAPAROSCOPIC GASTRIC SLEEVE RESECTION;  Surgeon: [name], MD;  Location: [facility];  Service: General;  Laterality: N/A;   TONSILLECTOMY     UPPER GI ENDOSCOPY N/A 03/05/2020    Procedure: UPPER GI ENDOSCOPY;  Surgeon: [name], MD;  Location: [facility];  Service: General;  Laterality: N/A;    Medications:  Current Outpatient Medications on File Prior to Visit  Medication Sig   BIOTIN PO Take by mouth.   Calcium Carb-Cholecalciferol (CALCIUM 500 + D PO) Take 500 mg by mouth 3 (three) times daily.   Cyanocobalamin (B-12 PO) Take by mouth.   Multiple Vitamins-Minerals (MULTIVITAMIN WITH MINERALS) tablet Take 1 tablet by mouth daily.   No current facility-administered medications on file prior to visit.    Allergies:  Allergies  Allergen Reactions   Ambien [Zolpidem Tartrate] Itching   Belviq [Lorcaserin] Itching   Bupropion Itching   Cymbalta [Duloxetine Hcl] Other (See Comments)    Headache    Social History:  Social History   Socioeconomic History   Marital status: Single    Spouse name: Not on file   Number of children: Not on file   Years of education: Not on file   Highest education level: Not on file  Occupational History   Not on file  Tobacco Use   Smoking status: Former    Packs/day: 0.25    Years: 10.00    Pack years: 2.50    Types: Cigarettes    Quit date: 02/23/2014    Years since quitting: 6.8   Smokeless tobacco: Never  Vaping Use   Vaping Use: Never used  Substance and Sexual Activity   Alcohol use: Yes    Alcohol/week: 5.0 standard drinks    Types: 5 Glasses of wine per week   Drug use: No   Sexual activity: Not Currently    Birth control/protection: None  Other Topics Concern   Not on file  Social History Narrative   Not on file   Social Determinants of Health   Financial Resource Strain: Not on file  Food Insecurity: Not on file  Transportation Needs: Not on file  Physical Activity: Not on file  Stress: Not on file  Social Connections: Not on file  Intimate Partner Violence: Not on file   Social History   Tobacco Use  Smoking Status Former   Packs/day: 0.25   Years: 10.00   Pack years: 2.50   Types:  Cigarettes   Quit date: 02/23/2014   Years since quitting: 6.8  Smokeless Tobacco Never   Social History   Substance and Sexual Activity  Alcohol Use Yes   Alcohol/week: 5.0 standard drinks   Types: 5 Glasses of wine per week    Family History:  Family History  Problem Relation Age of Onset   Hypertension Mother    Hyperlipidemia Mother    Dementia Mother  Heart disease Father    Stroke Father    Heart attack Father    Hypertension Father    Heart murmur Maternal Grandmother    Asthma Paternal Grandmother    Atrial fibrillation Maternal Aunt    Cancer Paternal Aunt        Breast    Past medical history, surgical history, medications, allergies, family history and social history reviewed with patient today and changes made to appropriate areas of the chart.   Review of Systems  Constitutional:  Positive for malaise/fatigue and weight loss. Negative for chills, diaphoresis and fever.  HENT:  Positive for hearing loss. Negative for congestion, ear discharge, ear pain, nosebleeds, sinus pain, sore throat and tinnitus.   Eyes: Negative.   Respiratory: Negative.  Negative for stridor.   Cardiovascular: Negative.   Gastrointestinal:  Positive for diarrhea. Negative for abdominal pain, blood in stool, constipation, heartburn, melena, nausea and vomiting.  Genitourinary: Negative.   Musculoskeletal: Negative.   Skin: Negative.   Neurological: Negative.   Endo/Heme/Allergies: Negative.   Psychiatric/Behavioral: Negative.    All other ROS negative except what is listed above and in the HPI.      Objective:    BP 121/83   Pulse 62   Ht 5' 2.75" (1.594 m)   SpO2 98%   BMI 34.93 kg/m   Wt Readings from Last 3 Encounters:  08/29/20 195 lb 9.6 oz (88.7 kg)  07/16/20 203 lb (92.1 kg)  05/23/20 215 lb 3.2 oz (97.6 kg)    Physical Exam Vitals and nursing note reviewed.  Constitutional:      General: She is not in acute distress.    Appearance: Normal appearance. She is  not ill-appearing, toxic-appearing or diaphoretic.  HENT:     Head: Normocephalic and atraumatic.     Right Ear: Tympanic membrane, ear canal and external ear normal. There is no impacted cerumen.     Left Ear: Tympanic membrane, ear canal and external ear normal. There is no impacted cerumen.     Nose: Nose normal. No congestion or rhinorrhea.     Mouth/Throat:     Mouth: Mucous membranes are moist.     Pharynx: Oropharynx is clear. No oropharyngeal exudate or posterior oropharyngeal erythema.  Eyes:     General: No scleral icterus.       Right eye: No discharge.        Left eye: No discharge.     Extraocular Movements: Extraocular movements intact.     Conjunctiva/sclera: Conjunctivae normal.     Pupils: Pupils are equal, round, and reactive to light.  Neck:     Vascular: No carotid bruit.  Cardiovascular:     Rate and Rhythm: Normal rate and regular rhythm.     Pulses: Normal pulses.     Heart sounds: No murmur heard.   No friction rub. No gallop.  Pulmonary:     Effort: Pulmonary effort is normal. No respiratory distress.     Breath sounds: Normal breath sounds. No stridor. No wheezing, rhonchi or rales.  Chest:     Chest wall: No tenderness.  Abdominal:     General: Abdomen is flat. Bowel sounds are normal. There is no distension.     Palpations: Abdomen is soft. There is no mass.     Tenderness: There is no abdominal tenderness. There is no right CVA tenderness, left CVA tenderness, guarding or rebound.     Hernia: No hernia is present.  Genitourinary:    Comments: Breast and  pelvic exams deferred with shared decision making Musculoskeletal:        General: No swelling, tenderness, deformity or signs of injury.     Cervical back: Normal range of motion and neck supple. No rigidity. No muscular tenderness.     Right lower leg: No edema.     Left lower leg: No edema.  Lymphadenopathy:     Cervical: No cervical adenopathy.  Skin:    General: Skin is warm and dry.      Capillary Refill: Capillary refill takes less than 2 seconds.     Coloration: Skin is not jaundiced or pale.     Findings: No bruising, erythema, lesion or rash.  Neurological:     General: No focal deficit present.     Mental Status: She is alert and oriented to person, place, and time. Mental status is at baseline.     Cranial Nerves: No cranial nerve deficit.     Sensory: No sensory deficit.     Motor: No weakness.     Coordination: Coordination normal.     Gait: Gait normal.     Deep Tendon Reflexes: Reflexes normal.  Psychiatric:        Mood and Affect: Mood normal.        Behavior: Behavior normal.        Thought Content: Thought content normal.        Judgment: Judgment normal.    Results for orders placed or performed in visit on 01/14/21  Microscopic Examination   Urine  Result Value Ref Range   WBC, UA 0-5 0 - 5 /hpf   RBC None seen 0 - 2 /hpf   Epithelial Cells (non renal) 0-10 0 - 10 /hpf   Bacteria, UA Moderate (A) None seen/Few  Urinalysis, Routine w reflex microscopic  Result Value Ref Range   Specific Gravity, UA 1.025 1.005 - 1.030   pH, UA 5.5 5.0 - 7.5   Color, UA Yellow Yellow   Appearance Ur Clear Clear   Leukocytes,UA Trace (A) Negative   Protein,UA Negative Negative/Trace   Glucose, UA Negative Negative   Ketones, UA Negative Negative   RBC, UA Negative Negative   Bilirubin, UA Negative Negative   Urobilinogen, Ur 0.2 0.2 - 1.0 mg/dL   Nitrite, UA Negative Negative   Microscopic Examination See below:   Bayer DCA Hb A1c Waived  Result Value Ref Range   HB A1C (BAYER DCA - WAIVED) 5.1 4.8 - 5.6 %  Microalbumin, Urine Waived  Result Value Ref Range   Microalb, Ur Waived 30 (H) 0 - 19 mg/L   Creatinine, Urine Waived 200 10 - 300 mg/dL   Microalb/Creat Ratio <30 <30 mg/g      Assessment & Plan:   Problem List Items Addressed This Visit       Endocrine   Multinodular goiter (nontoxic)    Rechecking labs today. Await results. Treat as  needed.       Relevant Orders   TSH     Other   Vitamin D deficiency    Rechecking labs today. Await results. Treat as needed.       Anxiety    Under good control on current regimen. Continue current regimen. Continue to monitor. Call with any concerns. Refills given. Rx should last 6+ months.        Relevant Medications   ALPRAZolam (XANAX) 0.5 MG tablet   Depression    Doing well off medicine. Continue to monitor. Call with any concerns.  Relevant Medications   ALPRAZolam (XANAX) 0.5 MG tablet   Mixed hyperlipidemia    Rechecking labs today. Await results. Treat as needed.       Relevant Orders   Lipid Panel w/o Chol/HDL Ratio   Autoimmune hemolytic anemia (HCC)    Doing well. Following with hematology. Call with any concerns.       RESOLVED: Prediabetes    Resolved with weight loss s/p surgery. Call with any concerns. Continue to monitor.       Relevant Orders   Urinalysis, Routine w reflex microscopic (Completed)   Bayer DCA Hb A1c Waived (Completed)   Microalbumin, Urine Waived (Completed)   Other Visit Diagnoses     Routine general medical examination at a health care facility    -  Primary   Vaccines up to date/declined. Screening labs checked today. Pap and mammo up to date- through GYN. Cologuard ordered today. Continue diet and exercise.    Diarrhea of presumed infectious origin       Will check stool studies and check stool studies. Await results. Treat as needed.    Relevant Orders   Stool C-Diff Toxin Assay   Stool Culture   Fecal leukocytes   Ova and parasite examination   Fecal occult blood, imunochemical([facility])   Screening for colon cancer       Cologuard ordered today.    Relevant Orders   Cologuard        Follow up plan: Return in about 6 months (around 07/14/2021) for Records release for GYN.   LABORATORY TESTING:  - Pap smear: up to date  IMMUNIZATIONS:   - Tdap: Tetanus vaccination status reviewed: declined-  will come in if she has a cut. - Influenza: Refused - Pneumovax: Not applicable - Prevnar: Not applicable - COVID: Up to date - HPV: Not applicable - Shingrix vaccine: Not applicable  SCREENING: -Mammogram: Up to date  - Colonoscopy: Cologuard ordered today   PATIENT COUNSELING:   Advised to take 1 mg of folate supplement per day if capable of pregnancy.   Sexuality: Discussed sexually transmitted diseases, partner selection, use of condoms, avoidance of unintended pregnancy  and contraceptive alternatives.   Advised to avoid cigarette smoking.  I discussed with the patient that most people either abstain from alcohol or drink within safe limits (<=14/week and <=4 drinks/occasion for males, <=7/weeks and <= 3 drinks/occasion for females) and that the risk for alcohol disorders and other health effects rises proportionally with the number of drinks per week and how often a drinker exceeds daily limits.  Discussed cessation/primary prevention of drug use and availability of treatment for abuse.   Diet: Encouraged to adjust caloric intake to maintain  or achieve ideal body weight, to reduce intake of dietary saturated fat and total fat, to limit sodium intake by avoiding high sodium foods and not adding table salt, and to maintain adequate dietary potassium and calcium preferably from fresh fruits, vegetables, and low-fat dairy products.    stressed the importance of regular exercise  Injury prevention: Discussed safety belts, safety helmets, smoke detector, smoking near bedding or upholstery.   Dental health: Discussed importance of regular tooth brushing, flossing, and dental visits.    NEXT PREVENTATIVE PHYSICAL DUE IN 1 YEAR. Return in about 6 months (around 07/14/2021) for Records release for GYN.

## 2021-01-14 NOTE — Assessment & Plan Note (Signed)
Under good control on current regimen. Continue current regimen. Continue to monitor. Call with any concerns. Refills given. Rx should last 6+ months.

## 2021-01-15 ENCOUNTER — Ambulatory Visit: Payer: 59 | Admitting: Family Medicine

## 2021-01-15 LAB — LIPID PANEL W/O CHOL/HDL RATIO
Cholesterol, Total: 224 mg/dL — ABNORMAL HIGH (ref 100–199)
HDL: 71 mg/dL (ref >39)
LDL Chol Calc (NIH): 142 mg/dL — ABNORMAL HIGH (ref 0–99)
Triglycerides: 64 mg/dL (ref 0–149)
VLDL Cholesterol Cal: 11 mg/dL (ref 5–40)

## 2021-01-15 LAB — TSH: TSH: 1.19 u[IU]/mL (ref 0.450–4.500)

## 2021-01-30 LAB — COLOGUARD: COLOGUARD: POSITIVE — AB

## 2021-01-31 ENCOUNTER — Other Ambulatory Visit: Payer: Self-pay | Admitting: Family Medicine

## 2021-01-31 DIAGNOSIS — R195 Other fecal abnormalities: Secondary | ICD-10-CM

## 2021-02-06 ENCOUNTER — Telehealth: Payer: Self-pay

## 2021-02-06 NOTE — Telephone Encounter (Signed)
CALLED PATIENT NO ANSWER LEFT VOICEMAIL FOR A CALL BACK

## 2021-02-10 ENCOUNTER — Telehealth: Payer: Self-pay

## 2021-02-10 ENCOUNTER — Other Ambulatory Visit: Payer: Self-pay

## 2021-02-10 DIAGNOSIS — R195 Other fecal abnormalities: Secondary | ICD-10-CM

## 2021-02-10 DIAGNOSIS — Z1211 Encounter for screening for malignant neoplasm of colon: Secondary | ICD-10-CM

## 2021-02-10 MED ORDER — PEG 3350-KCL-NA BICARB-NACL 420 G PO SOLR: 4000.0000 mL | Freq: Once | ORAL | 0 refills | Status: AC

## 2021-02-10 NOTE — Progress Notes (Signed)
Gastroenterology Pre-Procedure Review  Request Date: 02/20/21 Requesting Physician: Dr. Bonna Gains  PATIENT REVIEW QUESTIONS: The patient responded to the following health history questions as indicated:    1. Are you having any GI issues? no 2. Do you have a personal history of Polyps? no 3. Do you have a family history of Colon Cancer or Polyps? no 4. Diabetes Mellitus? no 5. Joint replacements in the past 12 months?no 6. Major health problems in the past 3 months?no 7. Any artificial heart valves, MVP, or defibrillator?no    MEDICATIONS & ALLERGIES:    Patient reports the following regarding taking any anticoagulation/antiplatelet therapy:   Plavix, Coumadin, Eliquis, Xarelto, Lovenox, Pradaxa, Brilinta, or Effient? no Aspirin? no  Patient confirms/reports the following medications:  Current Outpatient Medications  Medication Sig Dispense Refill   ALPRAZolam (XANAX) 0.5 MG tablet Take 0.5-2 tablets (0.25-1 mg total) by mouth 2 (two) times daily as needed for anxiety. 60 tablet 1   BIOTIN PO Take by mouth.     Calcium Carb-Cholecalciferol (CALCIUM 500 + D PO) Take 500 mg by mouth 3 (three) times daily.     Cyanocobalamin (B-12 PO) Take by mouth.     fluticasone (FLONASE) 50 MCG/ACT nasal spray Place 2 sprays into both nostrils daily as needed for allergies. 16 g 12   metroNIDAZOLE (FLAGYL) 500 MG tablet Take 1 tablet (500 mg total) by mouth 2 (two) times daily. 14 tablet 0   Multiple Vitamins-Minerals (MULTIVITAMIN WITH MINERALS) tablet Take 1 tablet by mouth daily.     No current facility-administered medications for this visit.    Patient confirms/reports the following allergies:  Allergies  Allergen Reactions   Ambien [Zolpidem Tartrate] Itching   Belviq [Lorcaserin] Itching   Bupropion Itching   Cymbalta [Duloxetine Hcl] Other (See Comments)    Headache    No orders of the defined types were placed in this encounter.   AUTHORIZATION INFORMATION Primary  Insurance: 1D#: Group #:  Secondary Insurance: 1D#: Group #:  SCHEDULE INFORMATION: Date: 02/20/21 Time: Location: [place]

## 2021-02-10 NOTE — Telephone Encounter (Signed)
Procedure has been scheduled for 02/20/21.

## 2021-02-10 NOTE — Telephone Encounter (Signed)
Patient is ready to schedule procedure. Clinical staff will follow up with patient.

## 2021-02-18 ENCOUNTER — Telehealth: Payer: Self-pay

## 2021-02-18 ENCOUNTER — Other Ambulatory Visit: Payer: Self-pay

## 2021-02-18 ENCOUNTER — Ambulatory Visit: Payer: 59 | Admitting: Family Medicine

## 2021-02-18 NOTE — Telephone Encounter (Signed)
Returned patients call. LVM to call back

## 2021-02-18 NOTE — Telephone Encounter (Signed)
Inbound call from pt requesting a call back to r/s her procedure. Thank you.

## 2021-02-18 NOTE — Progress Notes (Signed)
Procedure has been rescheduled for 02/25/21. Endo unit has been notified of change. Updated instructions via my chart.

## 2021-02-25 ENCOUNTER — Encounter: Payer: Self-pay | Admitting: Gastroenterology

## 2021-02-25 ENCOUNTER — Ambulatory Visit: Payer: Commercial Managed Care - HMO | Admitting: Anesthesiology

## 2021-02-25 ENCOUNTER — Encounter
Admission: RE | Disposition: A | Discharge status: Home / Self Care | Payer: Self-pay | Source: Ambulatory Visit | Attending: Gastroenterology

## 2021-02-25 ENCOUNTER — Ambulatory Visit
Admission: RE | Admit: 2021-02-25 | Discharge: 2021-02-25 | Disposition: A | Discharge status: Home / Self Care | Payer: Commercial Managed Care - HMO | Source: Ambulatory Visit | Attending: Gastroenterology | Admitting: Gastroenterology

## 2021-02-25 DIAGNOSIS — K635 Polyp of colon: Secondary | ICD-10-CM

## 2021-02-25 DIAGNOSIS — Z6831 Body mass index (BMI) 31.0-31.9, adult: Secondary | ICD-10-CM | POA: Diagnosis not present

## 2021-02-25 DIAGNOSIS — E782 Mixed hyperlipidemia: Secondary | ICD-10-CM | POA: Insufficient documentation

## 2021-02-25 DIAGNOSIS — R195 Other fecal abnormalities: Secondary | ICD-10-CM

## 2021-02-25 DIAGNOSIS — Z87891 Personal history of nicotine dependence: Secondary | ICD-10-CM | POA: Insufficient documentation

## 2021-02-25 DIAGNOSIS — Z1211 Encounter for screening for malignant neoplasm of colon: Secondary | ICD-10-CM

## 2021-02-25 HISTORY — PX: COLONOSCOPY WITH PROPOFOL: SHX5780

## 2021-02-25 LAB — POCT PREGNANCY, URINE: Preg Test, Ur: NEGATIVE

## 2021-02-25 SURGERY — COLONOSCOPY WITH PROPOFOL
Anesthesia: General

## 2021-02-25 MED ORDER — PROPOFOL 500 MG/50ML IV EMUL
INTRAVENOUS | Status: AC
  Filled 2021-02-25: qty 50

## 2021-02-25 MED ORDER — DEXMEDETOMIDINE HCL IN NACL 200 MCG/50ML IV SOLN
INTRAVENOUS | Status: DC | PRN
  Administered 2021-02-25: 8 ug via INTRAVENOUS

## 2021-02-25 MED ORDER — LIDOCAINE HCL (PF) 2 % IJ SOLN
INTRAMUSCULAR | Status: AC
  Filled 2021-02-25: qty 5

## 2021-02-25 MED ORDER — SODIUM CHLORIDE 0.9 % IV SOLN: INTRAVENOUS | Status: DC

## 2021-02-25 MED ORDER — DEXMEDETOMIDINE HCL IN NACL 200 MCG/50ML IV SOLN
INTRAVENOUS | Status: AC
  Filled 2021-02-25: qty 50

## 2021-02-25 MED ORDER — LIDOCAINE HCL (CARDIAC) PF 100 MG/5ML IV SOSY
PREFILLED_SYRINGE | INTRAVENOUS | Status: DC | PRN
  Administered 2021-02-25: 50 mg via INTRAVENOUS

## 2021-02-25 MED ORDER — PROPOFOL 500 MG/50ML IV EMUL
INTRAVENOUS | Status: DC | PRN
  Administered 2021-02-25: 130 ug/kg/min via INTRAVENOUS

## 2021-02-25 MED ORDER — EPHEDRINE 5 MG/ML INJ
INTRAVENOUS | Status: AC
  Filled 2021-02-25: qty 5

## 2021-02-25 MED ORDER — PROPOFOL 10 MG/ML IV BOLUS
INTRAVENOUS | Status: AC
  Filled 2021-02-25: qty 20

## 2021-02-25 MED ORDER — PROPOFOL 10 MG/ML IV BOLUS
INTRAVENOUS | Status: DC | PRN
  Administered 2021-02-25: 80 mg via INTRAVENOUS

## 2021-02-25 NOTE — Transfer of Care (Signed)
Immediate Anesthesia Transfer of Care Note  Patient: Cindy Gilbert  Procedure(s) Performed: COLONOSCOPY WITH PROPOFOL  Patient Location: PACU and Endoscopy Unit  Anesthesia Type:General  Level of Consciousness: drowsy and patient cooperative  Airway & Oxygen Therapy: Patient Spontanous Breathing  Post-op Assessment: Report given to RN and Post -op Vital signs reviewed and stable  Post vital signs: Reviewed and stable  Last Vitals:  Vitals Value Taken Time  BP 98/63 02/25/21 0902  Temp 36.8 C 02/25/21 0900  Pulse 61 02/25/21 0902  Resp 13 02/25/21 0902  SpO2 99 % 02/25/21 0902  Vitals shown include unvalidated device data.  Last Pain:  Vitals:   02/25/21 0900  TempSrc: Temporal  PainSc: Asleep         Complications: No notable events documented.

## 2021-02-25 NOTE — Op Note (Signed)
[facility] Gastroenterology Patient Name: Cindy Gilbert Procedure Date: 02/25/2021 8:22 AM MRN: [id number] Account #: [id number] Date of Birth: 1976/01/16 Admit Type: Outpatient Age: 46 Room: [facility] ENDO ROOM 3 Gender: Female Note Status: Finalized Instrument Name: Colonoscope [id number] Procedure:             Colonoscopy Indications:           This is the patient's first colonoscopy, Positive                         Cologuard test Providers:             Lin Landsman MD, MD Referring MD:          [name] (Referring MD) Medicines:             General Anesthesia Complications:         No immediate complications. Estimated blood loss: None. Procedure:             Pre-Anesthesia Assessment:                        - Prior to the procedure, a History and Physical was                         performed, and patient medications and allergies were                         reviewed. The patient is competent. The risks and                         benefits of the procedure and the sedation options and                         risks were discussed with the patient. All questions                         were answered and informed consent was obtained.                         Patient identification and proposed procedure were                         verified by the physician, the nurse, the                         anesthesiologist, the anesthetist and the technician                         in the pre-procedure area in the procedure room in the                         endoscopy suite. Mental Status Examination: alert and                         oriented. Airway Examination: normal oropharyngeal                         airway and neck mobility. Respiratory Examination:  clear to auscultation. CV Examination: normal.                         Prophylactic Antibiotics: The patient does not require                         prophylactic antibiotics.  Prior Anticoagulants: The                         patient has taken no previous anticoagulant or                         antiplatelet agents. ASA Grade Assessment: II - A                         patient with mild systemic disease. After reviewing                         the risks and benefits, the patient was deemed in                         satisfactory condition to undergo the procedure. The                         anesthesia plan was to use general anesthesia.                         Immediately prior to administration of medications,                         the patient was re-assessed for adequacy to receive                         sedatives. The heart rate, respiratory rate, oxygen                         saturations, blood pressure, adequacy of pulmonary                         ventilation, and response to care were monitored                         throughout the procedure. The physical status of the                         patient was re-assessed after the procedure.                        After obtaining informed consent, the colonoscope was                         passed under direct vision. Throughout the procedure,                         the patient's blood pressure, pulse, and oxygen                         saturations were monitored continuously. The  Colonoscope was introduced through the anus and                         advanced to the the cecum, identified by appendiceal                         orifice and ileocecal valve. The colonoscopy was                         performed without difficulty. The patient tolerated                         the procedure well. The quality of the bowel                         preparation was evaluated using the BBPS [facility] Bowel                         Preparation Scale) with scores of: Right Colon = 3,                         Transverse Colon = 3 and Left Colon = 3 (entire mucosa                         seen well  with no residual staining, small fragments                         of stool or opaque liquid). The total BBPS score                         equals 9. Findings:      The perianal and digital rectal examinations were normal. Pertinent       negatives include normal sphincter tone and no palpable rectal lesions.      A 5 mm polyp was found in the ascending colon. The polyp was sessile.       The polyp was removed with a cold snare. Resection and retrieval were       complete.      The retroflexed view of the distal rectum and anal verge was normal and       showed no anal or rectal abnormalities. Impression:            - One 5 mm polyp in the ascending colon, removed with                         a cold snare. Resected and retrieved.                        - The distal rectum and anal verge are normal on                         retroflexion view. Recommendation:        - Discharge patient to home (with escort).                        - Resume previous diet today.                        -  Continue present medications.                        - Await pathology results.                        - Repeat colonoscopy in 7-10 years for surveillance                         based on pathology results. Procedure Code(s):     --- Professional ---                        [phone], Colonoscopy, flexible; with removal of                         tumor(s), polyp(s), or other lesion(s) by snare                         technique Diagnosis Code(s):     --- Professional ---                        K63.5, Polyp of colon                        R19.5, Other fecal abnormalities CPT copyright 2019 American Medical Association. All rights reserved. The codes documented in this report are preliminary and upon coder review may  be revised to meet current compliance requirements. Dr. Ulyess Mort Lin Landsman MD, MD 02/25/2021 8:56:04 AM This report has been signed electronically. Number of Addenda: 0 Note Initiated  On: 02/25/2021 8:22 AM Scope Withdrawal Time: 0 hours 8 minutes 30 seconds  Total Procedure Duration: 0 hours 11 minutes 27 seconds  Estimated Blood Loss:  Estimated blood loss: none.      [facility]

## 2021-02-25 NOTE — Anesthesia Preprocedure Evaluation (Signed)
Anesthesia Evaluation  Patient identified by MRN, date of birth, ID band Patient awake    Reviewed: Allergy & Precautions, NPO status , Patient's Chart, lab work & pertinent test results  History of Anesthesia Complications Negative for: history of anesthetic complications  Airway Mallampati: III  TM Distance: >3 FB Neck ROM: full    Dental  (+) Chipped   Pulmonary neg shortness of breath, former smoker,    Pulmonary exam normal        Cardiovascular Exercise Tolerance: Good (-) angina(-) Past MI and (-) DOE Normal cardiovascular exam+ Valvular Problems/Murmurs      Neuro/Psych PSYCHIATRIC DISORDERS negative neurological ROS     GI/Hepatic negative GI ROS, Neg liver ROS, neg GERD  ,  Endo/Other  negative endocrine ROS  Renal/GU negative Renal ROS  negative genitourinary   Musculoskeletal   Abdominal   Peds  Hematology negative hematology ROS (+)   Anesthesia Other Findings Past Medical History: No date: Allergy     Comment:  Seasonal 03/22/2018: Anxiety 04/10/2020: Autoimmune hemolytic anemia (HCC) 03/05/2020: Chronic right hip pain 12/18/2016: Controlled substance agreement signed     Comment:  For xanax No date: COVID-19 No date: Depression     Comment:  previously on lexapro, celexa,  No date: Dyslipidemia 12/27/2019: Heart murmur No date: Hip pain, chronic     Comment:  Right 04/09/2020: HPV in female No date: Insomnia 12/06/2019: Mixed hyperlipidemia 10/15/2019: Morbid obesity ([place]) 03/16/2019: Multinodular goiter (nontoxic)     Comment:  Small nodules- no need for bx or follow up imaging 2020 No date: Ovarian cyst     Comment:  left 03/05/2020: Prediabetes No date: Right knee pain 03/05/2020: S/P laparoscopic sleeve gastrectomy 10/15/2019: Severe obesity (BMI >= 40) (HCC) No date: Vitamin D deficiency  Past Surgical History: No date: ADENOIDECTOMY 2001: CESAREAN SECTION No date: DILATION AND  CURETTAGE OF UTERUS 03/05/2020: LAPAROSCOPIC GASTRIC SLEEVE RESECTION; N/A     Comment:  Procedure: LAPAROSCOPIC GASTRIC SLEEVE RESECTION;                Surgeon: [name], MD;  Location: [facility] ORS;  Service:               General;  Laterality: N/A; No date: TONSILLECTOMY 03/05/2020: UPPER GI ENDOSCOPY; N/A     Comment:  Procedure: UPPER GI ENDOSCOPY;  Surgeon: [name],               MD;  Location: [facility] ORS;  Service: General;  Laterality:               N/A;  BMI    Body Mass Index: 31.00 kg/m      Reproductive/Obstetrics negative OB ROS                             Anesthesia Physical Anesthesia Plan  ASA: 3  Anesthesia Plan: General   Post-op Pain Management:    Induction: Intravenous  PONV Risk Score and Plan: Propofol infusion and TIVA  Airway Management Planned: Natural Airway and Nasal Cannula  Additional Equipment:   Intra-op Plan:   Post-operative Plan:   Informed Consent: I have reviewed the patients History and Physical, chart, labs and discussed the procedure including the risks, benefits and alternatives for the proposed anesthesia with the patient or authorized representative who has indicated his/her understanding and acceptance.     Dental Advisory Given  Plan Discussed with: Anesthesiologist, CRNA and Surgeon  Anesthesia Plan Comments: (  Patient consented for risks of anesthesia including but not limited to:  - adverse reactions to medications - risk of airway placement if required - damage to eyes, teeth, lips or other oral mucosa - nerve damage due to positioning  - sore throat or hoarseness - Damage to heart, brain, nerves, lungs, other parts of body or loss of life  Patient voiced understanding.)        Anesthesia Quick Evaluation

## 2021-02-25 NOTE — Anesthesia Postprocedure Evaluation (Signed)
Anesthesia Post Note  Patient: Occupational psychologist  Procedure(s) Performed: COLONOSCOPY WITH PROPOFOL  Patient location during evaluation: Endoscopy Anesthesia Type: General Level of consciousness: awake and alert Pain management: pain level controlled Vital Signs Assessment: post-procedure vital signs reviewed and stable Respiratory status: spontaneous breathing, nonlabored ventilation, respiratory function stable and patient connected to nasal cannula oxygen Cardiovascular status: blood pressure returned to baseline and stable Postop Assessment: no apparent nausea or vomiting Anesthetic complications: no   No notable events documented.   Last Vitals:  Vitals:   02/25/21 0910 02/25/21 0919  BP: 99/66 113/81  Pulse: (!) 59 (!) 52  Resp: 12 13  Temp:    SpO2: 100% 100%    Last Pain:  Vitals:   02/25/21 0919  TempSrc:   PainSc: 0-No pain                 Precious Haws Kaydyn Chism

## 2021-02-25 NOTE — H&P (Signed)
Cephas Darby, MD [address]  [address]  [place], [place] [address]  Main: [phone]  Fax: [phone] Pager: [phone]  Primary Care Physician:  [name], DO Primary Gastroenterologist:  Dr. Cephas Darby  Pre-Procedure History & Physical: HPI:  Cindy Gilbert is a 46 y.o. female is here for an colonoscopy.   Past Medical History:  Diagnosis Date   Allergy    Seasonal   Anxiety 03/22/2018   Autoimmune hemolytic anemia ([place]) 04/10/2020   Chronic right hip pain 03/05/2020   Controlled substance agreement signed 12/18/2016   For xanax   COVID-19    Depression    previously on lexapro, celexa,    Dyslipidemia    Heart murmur 12/27/2019   Hip pain, chronic    Right   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/09/2020   Insomnia    Mixed hyperlipidemia 12/06/2019   Morbid obesity ([place]) 10/15/2019   Multinodular goiter (nontoxic) 03/16/2019   Small nodules- no need for bx or follow up imaging 2020   Ovarian cyst    left   Prediabetes 03/05/2020   Right knee pain    S/P laparoscopic sleeve gastrectomy 03/05/2020   Severe obesity (BMI >= 40) ([place]) 10/15/2019   Vitamin D deficiency     Past Surgical History:  Procedure Laterality Date   ADENOIDECTOMY     CESAREAN SECTION  2001   DILATION AND CURETTAGE OF UTERUS     LAPAROSCOPIC GASTRIC SLEEVE RESECTION N/A 03/05/2020   Procedure: LAPAROSCOPIC GASTRIC SLEEVE RESECTION;  Surgeon: [name], MD;  Location: [facility];  Service: General;  Laterality: N/A;   TONSILLECTOMY     UPPER GI ENDOSCOPY N/A 03/05/2020   Procedure: UPPER GI ENDOSCOPY;  Surgeon: [name], MD;  Location: [facility];  Service: General;  Laterality: N/A;    Prior to Admission medications   Medication Sig Start Date End Date Taking? Authorizing Provider  ALPRAZolam Duanne Moron) 0.5 MG tablet Take 0.5-2 tablets (0.25-1 mg total) by mouth 2 (two) times daily as needed for anxiety. 01/14/21   Johnson, Megan P, DO  BIOTIN PO Take by mouth.    [provider]   Calcium Carb-Cholecalciferol (CALCIUM 500 + D PO) Take 500 mg by mouth 3 (three) times daily.    [provider]  Cyanocobalamin (B-12 PO) Take by mouth.    [provider]  fluticasone (FLONASE) 50 MCG/ACT nasal spray Place 2 sprays into both nostrils daily as needed for allergies. 01/14/21   Johnson, Megan P, DO  metroNIDAZOLE (FLAGYL) 500 MG tablet Take 1 tablet (500 mg total) by mouth 2 (two) times daily. 01/14/21   Park Liter P, DO  Multiple Vitamins-Minerals (MULTIVITAMIN WITH MINERALS) tablet Take 1 tablet by mouth daily.    [provider]    Allergies as of 02/10/2021 - Review Complete 01/14/2021  Allergen Reaction Noted   Ambien [zolpidem tartrate] Itching 09/22/2016   Belviq [lorcaserin] Itching 01/28/2018   Bupropion Itching 09/22/2016   Cymbalta [duloxetine hcl] Other (See Comments) 11/17/2016    Family History  Problem Relation Age of Onset   Hypertension Mother    Hyperlipidemia Mother    Dementia Mother    Heart disease Father    Stroke Father    Heart attack Father    Hypertension Father    Heart murmur Maternal Grandmother    Asthma Paternal Grandmother    Atrial fibrillation Maternal Aunt    Cancer Paternal Aunt        Breast    Social  History   Socioeconomic History   Marital status: Single    Spouse name: Not on file   Number of children: Not on file   Years of education: Not on file   Highest education level: Not on file  Occupational History   Not on file  Tobacco Use   Smoking status: Former    Packs/day: 0.25    Years: 10.00    Pack years: 2.50    Types: Cigarettes    Quit date: 02/23/2014    Years since quitting: 7.0   Smokeless tobacco: Never  Vaping Use   Vaping Use: Never used  Substance and Sexual Activity   Alcohol use: Yes    Alcohol/week: 5.0 standard drinks    Types: 5 Glasses of wine per week   Drug use: No   Sexual activity: Not Currently    Birth control/protection: None  Other Topics  Concern   Not on file  Social History Narrative   Not on file   Social Determinants of Health   Financial Resource Strain: Not on file  Food Insecurity: Not on file  Transportation Needs: Not on file  Physical Activity: Not on file  Stress: Not on file  Social Connections: Not on file  Intimate Partner Violence: Not on file    Review of Systems: See HPI, otherwise negative ROS  Physical Exam: BP (!) 132/103    Pulse 68    Temp (!) 96.1 F (35.6 C) (Temporal)    Resp 18    Ht 5' 3"  (1.6 m)    Wt 79.4 kg    SpO2 100%    BMI 31.00 kg/m  General:   Alert,  pleasant and cooperative in NAD Head:  Normocephalic and atraumatic. Neck:  Supple; no masses or thyromegaly. Lungs:  Clear throughout to auscultation.    Heart:  Regular rate and rhythm. Abdomen:  Soft, nontender and nondistended. Normal bowel sounds, without guarding, and without rebound.   Neurologic:  Alert and  oriented x4;  grossly normal neurologically.  Impression/Plan: Cindy Gilbert is here for an colonoscopy to be performed for positive cologaurd  Risks, benefits, limitations, and alternatives regarding  endoscopy have been reviewed with the patient.  Questions have been answered.  All parties agreeable.   Sherri Sear, MD  02/25/2021, 8:24 AM

## 2021-02-26 ENCOUNTER — Encounter: Payer: Self-pay | Admitting: Gastroenterology

## 2021-02-26 LAB — SURGICAL PATHOLOGY

## 2021-02-27 ENCOUNTER — Inpatient Hospital Stay: Payer: Self-pay | Attending: Hematology and Oncology

## 2021-02-27 ENCOUNTER — Inpatient Hospital Stay: Payer: Self-pay | Admitting: Hematology and Oncology

## 2021-02-28 ENCOUNTER — Other Ambulatory Visit: Payer: 59

## 2021-02-28 ENCOUNTER — Ambulatory Visit: Payer: 59 | Admitting: Hematology and Oncology

## 2021-05-29 LAB — HM PAP SMEAR

## 2021-07-14 ENCOUNTER — Ambulatory Visit: Payer: Self-pay | Admitting: Family Medicine

## 2021-09-26 ENCOUNTER — Encounter (HOSPITAL_COMMUNITY): Payer: Self-pay | Admitting: *Deleted

## 2022-04-22 ENCOUNTER — Ambulatory Visit: Payer: Commercial Managed Care - HMO | Admitting: Nurse Practitioner

## 2022-04-22 ENCOUNTER — Encounter: Payer: Self-pay | Admitting: Nurse Practitioner

## 2022-04-22 VITALS — BP 124/82 | HR 86 | Temp 98.6°F | Ht 63.0 in | Wt 196.0 lb

## 2022-04-22 DIAGNOSIS — E782 Mixed hyperlipidemia: Secondary | ICD-10-CM

## 2022-04-22 DIAGNOSIS — L810 Postinflammatory hyperpigmentation: Secondary | ICD-10-CM | POA: Insufficient documentation

## 2022-04-22 DIAGNOSIS — L858 Other specified epidermal thickening: Secondary | ICD-10-CM | POA: Insufficient documentation

## 2022-04-22 DIAGNOSIS — F331 Major depressive disorder, recurrent, moderate: Secondary | ICD-10-CM | POA: Diagnosis not present

## 2022-04-22 DIAGNOSIS — B977 Papillomavirus as the cause of diseases classified elsewhere: Secondary | ICD-10-CM | POA: Insufficient documentation

## 2022-04-22 DIAGNOSIS — E559 Vitamin D deficiency, unspecified: Secondary | ICD-10-CM

## 2022-04-22 DIAGNOSIS — D591 Autoimmune hemolytic anemia, unspecified: Secondary | ICD-10-CM

## 2022-04-22 DIAGNOSIS — Z7689 Persons encountering health services in other specified circumstances: Secondary | ICD-10-CM

## 2022-04-22 DIAGNOSIS — F5101 Primary insomnia: Secondary | ICD-10-CM | POA: Diagnosis not present

## 2022-04-22 DIAGNOSIS — E042 Nontoxic multinodular goiter: Secondary | ICD-10-CM

## 2022-04-22 MED ORDER — VORTIOXETINE HBR 5 MG PO TABS: 5.0000 mg | ORAL_TABLET | Freq: Every day | ORAL | 1 refills | Status: DC

## 2022-04-22 NOTE — Progress Notes (Signed)
I,Sheena H Holbrook,acting as a Education administrator for Minette Brine, FNP.,have documented all relevant documentation on the behalf of Minette Brine, FNP,as directed by  Minette Brine, FNP while in the presence of Minette Brine, [place].   Subjective:     Patient ID: Cindy Gilbert , female    DOB: 1975/10/09 , 47 y.o.   MRN: [id number]   Chief Complaint  Patient presents with   Establish Care    HPI  Patient presents today to establish care. She was previously seeing Dr. Park Liter - last visit was in 2022. She relocated to [place].  She works as a Transport planner for approximately 10 years. One son who is 61 y/o - healthy.  She is recently single with a recent break up this is when her depression worsened. She has dealt with depression for most of her life. Currently in therapy with Davonna Belling. She has tried wellbutrin (had itching but worked the best), cymbalta and Lorrin Mais ( to help her sleep). She has chronic insomnia since 2018. She has seen a psychiatrist in the past that started an antidepressant - effexor (was not a fan of how it made feel overall). She has tried lexapro - "felt yucky" from her OB/GYN for about 2 months. Stopped taking once she got in a relationship. She had been taking for 2 months.   She has seen a hematologist in 2022 followed up until her hemoglobin was normal again.   She has had an abnormal PAP in the past year with biopsy by Dr. Servando Salina has done yearly due to HPV  She has tried trazadone but did not work due to her problem is staying asleep and she was groggy in the am. Ambien caused itching  She had a gastric sleeve in 2022 starting weight was 250 lbs. She does yoga and started back walking to help relieve stress.       Past Medical History:  Diagnosis Date   Allergy    Seasonal   Anxiety 03/22/2018   Autoimmune hemolytic anemia (HCC) 04/10/2020   Chronic right hip pain 03/05/2020   Controlled substance agreement signed 12/18/2016   For xanax    COVID-19    Depression    previously on lexapro, celexa,    Dyslipidemia    Heart murmur 12/27/2019   Hip pain, chronic    Right   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/09/2020   Insomnia    Mixed hyperlipidemia 12/06/2019   Morbid obesity ([place]) 10/15/2019   Multinodular goiter (nontoxic) 03/16/2019   Small nodules- no need for bx or follow up imaging 2020   Ovarian cyst    left   Prediabetes 03/05/2020   Right knee pain    S/P laparoscopic sleeve gastrectomy 03/05/2020   Severe obesity (BMI >= 40) ([place]) 10/15/2019   Vitamin D deficiency      Family History  Problem Relation Age of Onset   Hypertension Mother    Hyperlipidemia Mother    Dementia Mother    Heart disease Father    Stroke Father    Heart attack Father    Hypertension Father    Heart murmur Maternal Grandmother    Asthma Paternal Grandmother    Atrial fibrillation Maternal Aunt    Cancer Paternal Aunt        Breast     Current Outpatient Medications:    BIOTIN PO, Take by mouth., Disp: , Rfl:    Calcium Carb-Cholecalciferol (CALCIUM 500 + D PO), Take 500 mg by mouth 3 (three) times daily., Disp: ,  Rfl:    Cyanocobalamin (B-12 PO), Take by mouth., Disp: , Rfl:    fluticasone (FLONASE) 50 MCG/ACT nasal spray, Place 2 sprays into both nostrils daily as needed for allergies., Disp: 16 g, Rfl: 12   Multiple Vitamins-Minerals (MULTIVITAMIN WITH MINERALS) tablet, Take 1 tablet by mouth daily., Disp: , Rfl:    vortioxetine HBr (TRINTELLIX) 5 MG TABS tablet, Take 1 tablet (5 mg total) by mouth daily., Disp: 90 tablet, Rfl: 1   Allergies  Allergen Reactions   Ambien [Zolpidem Tartrate] Itching   Belviq [Lorcaserin] Itching   Bupropion Itching   Cymbalta [Duloxetine Hcl] Other (See Comments)    Headache      The patient's tobacco use is:  Social History   Tobacco Use  Smoking Status Former   Packs/day: 0.25   Years: 10.00   Total pack years: 2.50   Types: Cigarettes   Quit date: 02/23/2014   Years since quitting: 8.1   Smokeless Tobacco Never   She has been exposed to passive smoke. The patient's alcohol use is:  Social History   Substance and Sexual Activity  Alcohol Use Yes   Alcohol/week: 5.0 standard drinks of alcohol   Types: 5 Glasses of wine per week   Review of Systems  Constitutional: Negative.  Negative for appetite change and fatigue.  Respiratory: Negative.    Cardiovascular: Negative.   Gastrointestinal: Negative.  Negative for abdominal pain.       Concerned about surgical scar adhering to bladder  Endocrine: Negative for cold intolerance and heat intolerance.  Skin: Negative.   Neurological: Negative.  Negative for dizziness, light-headedness and headaches.  Psychiatric/Behavioral:  Negative for behavioral problems and dysphoric mood. The patient is not nervous/anxious.      Today's Vitals   04/22/22 1040  BP: 124/82  Pulse: 86  Temp: 98.6 F (37 C)  TempSrc: Oral  SpO2: 97%  Weight: 196 lb (88.9 kg)  Height: '5\' 3"'$  (1.6 m)   Body mass index is 34.72 kg/m.   Objective:  Physical Exam Vitals reviewed.  Constitutional:      General: She is not in acute distress.    Appearance: Normal appearance. She is well-developed. She is obese.  Cardiovascular:     Rate and Rhythm: Normal rate and regular rhythm.     Pulses: Normal pulses.     Heart sounds: Normal heart sounds. No murmur heard. Pulmonary:     Effort: Pulmonary effort is normal. No respiratory distress.     Breath sounds: Normal breath sounds. No wheezing.  Chest:     Chest wall: No tenderness.  Musculoskeletal:        General: Normal range of motion.  Skin:    General: Skin is warm and dry.     Capillary Refill: Capillary refill takes less than 2 seconds.  Neurological:     General: No focal deficit present.     Mental Status: She is alert and oriented to person, place, and time.     Cranial Nerves: No cranial nerve deficit.  Psychiatric:        Mood and Affect: Mood normal.        Behavior: Behavior  normal.        Thought Content: Thought content normal.        Judgment: Judgment normal.         Assessment And Plan:     1. Moderate episode of recurrent major depressive disorder (HCC) Comments: Depression screen score is 18, will start on Trintellix  which is weight neutral pending insurance approval. - TSH - Vitamin B12 - vortioxetine HBr (TRINTELLIX) 5 MG TABS tablet; Take 1 tablet (5 mg total) by mouth daily.  Dispense: 90 tablet; Refill: 1  2. Vitamin D deficiency Will check vitamin D level and supplement as needed.    Also encouraged to spend 15 minutes in the sun daily.  - VITAMIN D 25 Hydroxy (Vit-D Deficiency, Fractures) - Vitamin B12  3. Mixed hyperlipidemia Comments: No current medications, encouraged to eat a low fat diet - CMP14+EGFR  4. Primary insomnia Comments: Has tried Trazadone in the past and ambien. Will try to see if her depression is treated if she gets better sleep  5. Multinodular goiter (nontoxic) Comments: Previous history will recheck thyroid ultrasound - US THYROID; Future  6. Autoimmune hemolytic anemia (HCC) - CBC with Differential/Platelet - Vitamin B12  7. Encounter to establish care     Patient was given opportunity to ask questions. Patient verbalized understanding of the plan and was able to repeat key elements of the plan. All questions were answered to their satisfaction.   Minette Brine, FNP   I, Minette Brine, FNP, have reviewed all documentation for this visit. The documentation on 04/22/22 for the exam, diagnosis, procedures, and orders are all accurate and complete.   THE PATIENT IS ENCOURAGED TO PRACTICE SOCIAL DISTANCING DUE TO THE COVID-19 PANDEMIC.

## 2022-04-22 NOTE — Patient Instructions (Signed)
Call with any questions or send mychart message with medications

## 2022-04-23 LAB — CBC WITH DIFFERENTIAL/PLATELET
Basophils Absolute: 0.1 10*3/uL (ref 0.0–0.2)
Basos: 1 %
EOS (ABSOLUTE): 0.1 10*3/uL (ref 0.0–0.4)
Eos: 2 %
Hematocrit: 42.8 % (ref 34.0–46.6)
Hemoglobin: 14 g/dL (ref 11.1–15.9)
Immature Grans (Abs): 0 10*3/uL (ref 0.0–0.1)
Immature Granulocytes: 0 %
Lymphocytes Absolute: 3.2 10*3/uL — ABNORMAL HIGH (ref 0.7–3.1)
Lymphs: 55 %
MCH: 28.8 pg (ref 26.6–33.0)
MCHC: 32.7 g/dL (ref 31.5–35.7)
MCV: 88 fL (ref 79–97)
Monocytes Absolute: 0.3 10*3/uL (ref 0.1–0.9)
Monocytes: 6 %
Neutrophils Absolute: 2.1 10*3/uL (ref 1.4–7.0)
Neutrophils: 36 %
Platelets: 237 10*3/uL (ref 150–450)
RBC: 4.86 x10E6/uL (ref 3.77–5.28)
RDW: 13.1 % (ref 11.7–15.4)
WBC: 5.8 10*3/uL (ref 3.4–10.8)

## 2022-04-23 LAB — CMP14+EGFR
ALT: 14 IU/L (ref 0–32)
AST: 19 IU/L (ref 0–40)
Albumin/Globulin Ratio: 1.8 (ref 1.2–2.2)
Albumin: 4.6 g/dL (ref 3.9–4.9)
Alkaline Phosphatase: 92 IU/L (ref 44–121)
BUN/Creatinine Ratio: 17 (ref 9–23)
BUN: 15 mg/dL (ref 6–24)
Bilirubin Total: 0.7 mg/dL (ref 0.0–1.2)
CO2: 26 mmol/L (ref 20–29)
Calcium: 10 mg/dL (ref 8.7–10.2)
Chloride: 102 mmol/L (ref 96–106)
Creatinine, Ser: 0.89 mg/dL (ref 0.57–1.00)
Globulin, Total: 2.6 g/dL (ref 1.5–4.5)
Glucose: 85 mg/dL (ref 70–99)
Potassium: 4.8 mmol/L (ref 3.5–5.2)
Sodium: 143 mmol/L (ref 134–144)
Total Protein: 7.2 g/dL (ref 6.0–8.5)
eGFR: 81 mL/min/{1.73_m2} (ref >59)

## 2022-04-23 LAB — VITAMIN B12: Vitamin B-12: 339 pg/mL (ref 232–1245)

## 2022-04-23 LAB — VITAMIN D 25 HYDROXY (VIT D DEFICIENCY, FRACTURES): Vit D, 25-Hydroxy: 51.5 ng/mL (ref 30.0–100.0)

## 2022-04-23 LAB — TSH: TSH: 1.16 u[IU]/mL (ref 0.450–4.500)

## 2022-05-07 ENCOUNTER — Encounter: Payer: Self-pay | Admitting: Nurse Practitioner

## 2022-05-18 ENCOUNTER — Ambulatory Visit
Admission: RE | Admit: 2022-05-18 | Discharge: 2022-05-18 | Disposition: A | Discharge status: Home / Self Care | Payer: Commercial Managed Care - HMO | Source: Ambulatory Visit | Attending: Nurse Practitioner | Admitting: Nurse Practitioner

## 2022-05-18 DIAGNOSIS — E042 Nontoxic multinodular goiter: Secondary | ICD-10-CM

## 2022-05-28 ENCOUNTER — Ambulatory Visit (INDEPENDENT_AMBULATORY_CARE_PROVIDER_SITE_OTHER): Payer: Commercial Managed Care - HMO | Admitting: Nurse Practitioner

## 2022-05-28 ENCOUNTER — Encounter: Payer: Self-pay | Admitting: Nurse Practitioner

## 2022-05-28 VITALS — BP 122/72 | HR 69 | Temp 97.0°F | Ht 63.0 in | Wt 200.0 lb

## 2022-05-28 DIAGNOSIS — F419 Anxiety disorder, unspecified: Secondary | ICD-10-CM

## 2022-05-28 DIAGNOSIS — F331 Major depressive disorder, recurrent, moderate: Secondary | ICD-10-CM

## 2022-05-28 NOTE — Progress Notes (Signed)
Subjective:     Patient ID: Cindy Gilbert , female    DOB: 03/10/1975 , 47 y.o.   MRN: [id number]   Chief Complaint  Patient presents with   Medical Management of Chronic Issues    HPI  She presents today follow up for medication. She reports working with the therapist. She is back outside hiking, yoga, and mediation. She goes the gym and needs improvement on water intake.  Denies chest pain, dizziness, fainting. She received a months worth of Trintellix from office and is not experiencing insurance problems. She had feet itching with the second week of trintellix but has gone away.       Past Medical History:  Diagnosis Date   Allergy    Seasonal   Anxiety 03/22/2018   Autoimmune hemolytic anemia 04/10/2020   Chronic right hip pain 03/05/2020   Controlled substance agreement signed 12/18/2016   For xanax   COVID-19    Depression    previously on lexapro, celexa,    Dyslipidemia    Heart murmur 12/27/2019   Hip pain, chronic    Right   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/09/2020   Insomnia    Mixed hyperlipidemia 12/06/2019   Morbid obesity 10/15/2019   Multinodular goiter (nontoxic) 03/16/2019   Small nodules- no need for bx or follow up imaging 2020   Ovarian cyst    left   Prediabetes 03/05/2020   Right knee pain    S/P laparoscopic sleeve gastrectomy 03/05/2020   Severe obesity (BMI >= 40) 10/15/2019   Vitamin D deficiency      Family History  Problem Relation Age of Onset   Hypertension Mother    Hyperlipidemia Mother    Dementia Mother    Heart disease Father    Stroke Father    Heart attack Father    Hypertension Father    Heart murmur Maternal Grandmother    Asthma Paternal Grandmother    Atrial fibrillation Maternal Aunt    Cancer Paternal Aunt        Breast     Current Outpatient Medications:    BIOTIN PO, Take by mouth., Disp: , Rfl:    Calcium Carb-Cholecalciferol (CALCIUM 500 + D PO), Take 500 mg by mouth 3 (three) times daily., Disp: , Rfl:     Cyanocobalamin (B-12 PO), Take by mouth., Disp: , Rfl:    fluticasone (FLONASE) 50 MCG/ACT nasal spray, Place 2 sprays into both nostrils daily as needed for allergies., Disp: 16 g, Rfl: 12   Multiple Vitamins-Minerals (MULTIVITAMIN WITH MINERALS) tablet, Take 1 tablet by mouth daily., Disp: , Rfl:    vortioxetine HBr (TRINTELLIX) 5 MG TABS tablet, Take 1 tablet (5 mg total) by mouth daily., Disp: 90 tablet, Rfl: 1   Allergies  Allergen Reactions   Ambien [Zolpidem Tartrate] Itching   Belviq [Lorcaserin] Itching   Bupropion Itching   Cymbalta [Duloxetine Hcl] Other (See Comments)    Headache     Review of Systems  Constitutional: Negative.   Respiratory: Negative.    Cardiovascular: Negative.   Neurological: Negative.   Psychiatric/Behavioral: Negative.       Today's Vitals   05/28/22 1212  BP: 122/72  Pulse: 69  Temp: (!) 97 F (36.1 C)  TempSrc: Oral  SpO2: 96%  Weight: 200 lb (90.7 kg)  Height: 5\' 3"  (1.6 m)   Body mass index is 35.43 kg/m.   Objective:  Physical Exam Vitals reviewed.  Constitutional:      General: She is not  in acute distress.    Appearance: Normal appearance.  Cardiovascular:     Rate and Rhythm: Normal rate and regular rhythm.     Pulses: Normal pulses.     Heart sounds: Normal heart sounds. No murmur heard. Pulmonary:     Effort: Pulmonary effort is normal. No respiratory distress.     Breath sounds: Normal breath sounds. No wheezing.  Skin:    General: Skin is warm and dry.     Capillary Refill: Capillary refill takes less than 2 seconds.  Neurological:     General: No focal deficit present.     Mental Status: She is alert and oriented to person, place, and time.     Cranial Nerves: No cranial nerve deficit.     Motor: No weakness.         Assessment And Plan:     1. Moderate episode of recurrent major depressive disorder Comments: She is feeling better since her last visit. No changes to medications  2. Anxiety Comments:  Doing well, continue current medications     Patient was given opportunity to ask questions. Patient verbalized understanding of the plan and was able to repeat key elements of the plan. All questions were answered to their satisfaction.  Arnette Felts, FNP    I, Arnette Felts, FNP, have reviewed all documentation for this visit. The documentation on 05/28/22 for the exam, diagnosis, procedures, and orders are all accurate and complete.  IF YOU HAVE BEEN REFERRED TO A SPECIALIST, IT MAY TAKE 1-2 WEEKS TO SCHEDULE/PROCESS THE REFERRAL. IF YOU HAVE NOT HEARD FROM US/SPECIALIST IN TWO WEEKS, PLEASE GIVE [place] A CALL AT [phone].   THE PATIENT IS ENCOURAGED TO PRACTICE SOCIAL DISTANCING DUE TO THE COVID-19 PANDEMIC.

## 2022-05-28 NOTE — Progress Notes (Signed)
I,Geran Haithcock H Daily Doe,acting as a Education administrator for Minette Brine, FNP.,have documented all relevant documentation on the behalf of Minette Brine, FNP,as directed by  Minette Brine, FNP while in the presence of Minette Brine, [place].    Subjective:     Patient ID: Cindy Gilbert , female    DOB: 05-Oct-1975 , 47 y.o.   MRN: [id number]   No chief complaint on file.   HPI  Patient presents today for MDD follow up, was recently started on Trintellix. She reports her insurance co-pay was >$400. She did take the samples she was given, which was about a month's worth; reports no side-effects and improvement in MDD symptoms. She would like to continue the medication.       Past Medical History:  Diagnosis Date   Allergy    Seasonal   Anxiety 03/22/2018   Autoimmune hemolytic anemia (HCC) 04/10/2020   Chronic right hip pain 03/05/2020   Controlled substance agreement signed 12/18/2016   For xanax   COVID-19    Depression    previously on lexapro, celexa,    Dyslipidemia    Heart murmur 12/27/2019   Hip pain, chronic    Right   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/09/2020   Insomnia    Mixed hyperlipidemia 12/06/2019   Morbid obesity ([place]) 10/15/2019   Multinodular goiter (nontoxic) 03/16/2019   Small nodules- no need for bx or follow up imaging 2020   Ovarian cyst    left   Prediabetes 03/05/2020   Right knee pain    S/P laparoscopic sleeve gastrectomy 03/05/2020   Severe obesity (BMI >= 40) ([place]) 10/15/2019   Vitamin D deficiency      Family History  Problem Relation Age of Onset   Hypertension Mother    Hyperlipidemia Mother    Dementia Mother    Heart disease Father    Stroke Father    Heart attack Father    Hypertension Father    Heart murmur Maternal Grandmother    Asthma Paternal Grandmother    Atrial fibrillation Maternal Aunt    Cancer Paternal Aunt        Breast     Current Outpatient Medications:    BIOTIN PO, Take by mouth., Disp: , Rfl:    Calcium Carb-Cholecalciferol (CALCIUM 500 + D  PO), Take 500 mg by mouth 3 (three) times daily., Disp: , Rfl:    Cyanocobalamin (B-12 PO), Take by mouth., Disp: , Rfl:    fluticasone (FLONASE) 50 MCG/ACT nasal spray, Place 2 sprays into both nostrils daily as needed for allergies., Disp: 16 g, Rfl: 12   Multiple Vitamins-Minerals (MULTIVITAMIN WITH MINERALS) tablet, Take 1 tablet by mouth daily., Disp: , Rfl:    vortioxetine HBr (TRINTELLIX) 5 MG TABS tablet, Take 1 tablet (5 mg total) by mouth daily., Disp: 90 tablet, Rfl: 1   Allergies  Allergen Reactions   Ambien [Zolpidem Tartrate] Itching   Belviq [Lorcaserin] Itching   Bupropion Itching   Cymbalta [Duloxetine Hcl] Other (See Comments)    Headache     Review of Systems  All other systems reviewed and are negative.    There were no vitals filed for this visit. There is no height or weight on file to calculate BMI.   Objective:  Physical Exam      Assessment And Plan:     There are no diagnoses linked to this encounter.    Patient was given opportunity to ask questions. Patient verbalized understanding of the plan and was able to repeat key  elements of the plan. All questions were answered to their satisfaction.  Marrian Salvage, [place] Lannette Avellino, CMA, have reviewed all documentation for this visit. The documentation on 05/28/22 for the exam, diagnosis, procedures, and orders are all accurate and complete.   IF YOU HAVE BEEN REFERRED TO A SPECIALIST, IT MAY TAKE 1-2 WEEKS TO SCHEDULE/PROCESS THE REFERRAL. IF YOU HAVE NOT HEARD FROM US/SPECIALIST IN TWO WEEKS, PLEASE GIVE [place] A CALL AT [phone].   THE PATIENT IS ENCOURAGED TO PRACTICE SOCIAL DISTANCING DUE TO THE COVID-19 PANDEMIC.

## 2022-06-06 IMAGING — CT CT ABD-PELV W/O CM
2 of 4 series · 16 of 46 positions shown, 18 images · non-contrast
Comparison: None.

CLINICAL DATA: Postop gastric sleeve fever

EXAM:
CT ABDOMEN AND PELVIS WITHOUT CONTRAST
TECHNIQUE: Multidetector CT imaging of the abdomen and pelvis was performed
following the standard protocol without IV contrast.

[Series 2: axial st · axial · 0.71mm/px · z∈[+1030,+1415]mm · 13 of 89 slices shown, 15 images]
[im 6/89  soft-tissue]
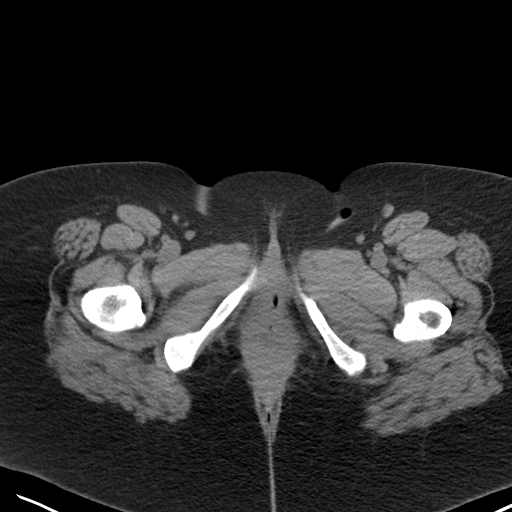
[im 6/89  bone]
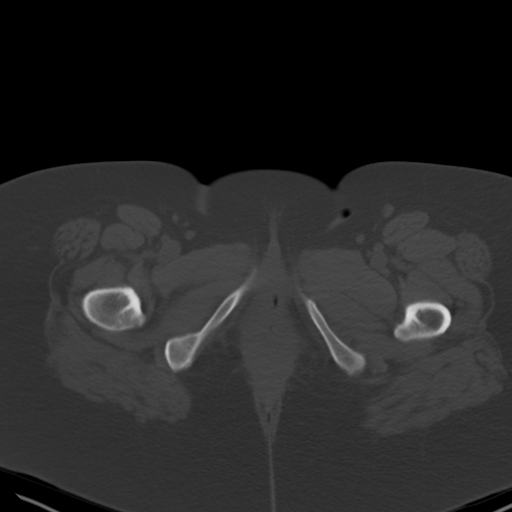
[im 11/89  soft-tissue]
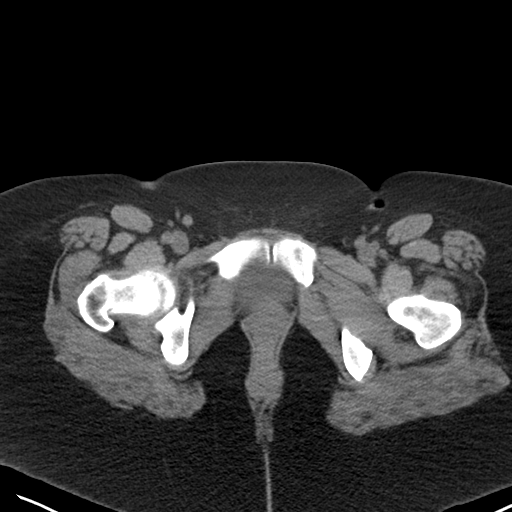
[im 21/89  soft-tissue]
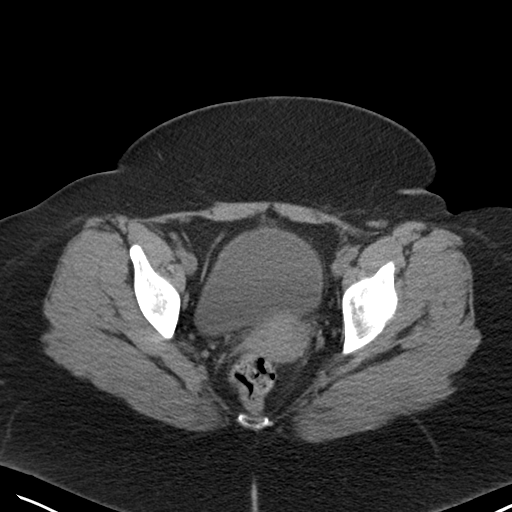
[im 26/89  soft-tissue]
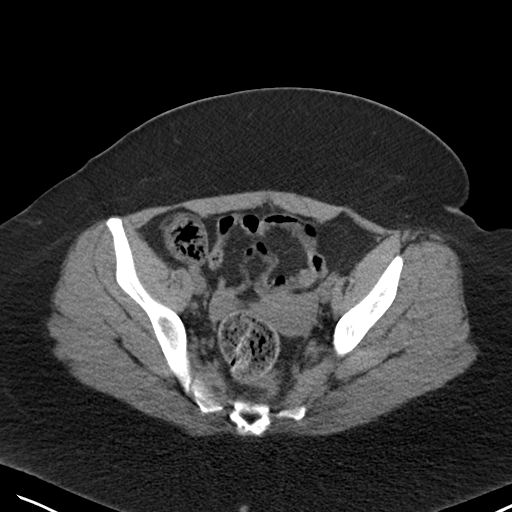
[im 32/89  soft-tissue]
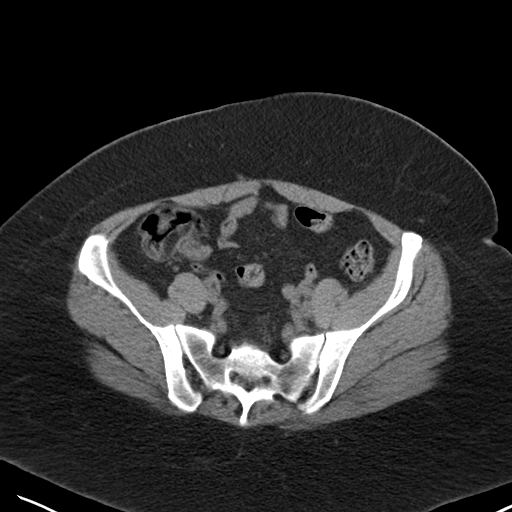
[im 37/89  soft-tissue]
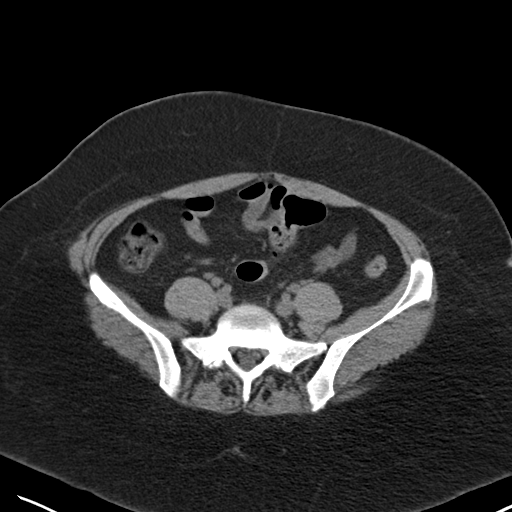
[im 47/89  soft-tissue]
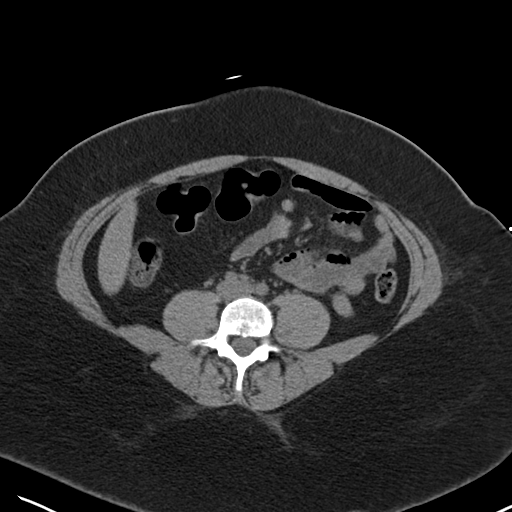
[im 52/89  soft-tissue]
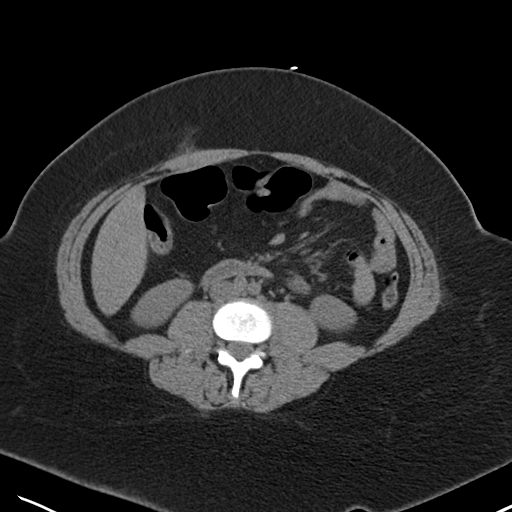
[im 57/89  soft-tissue]
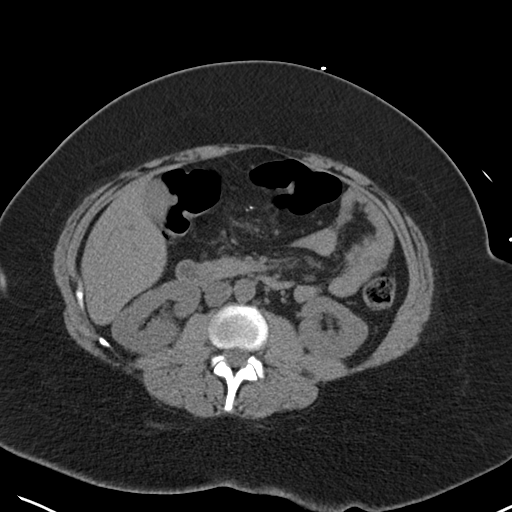
[im 57/89  bone]
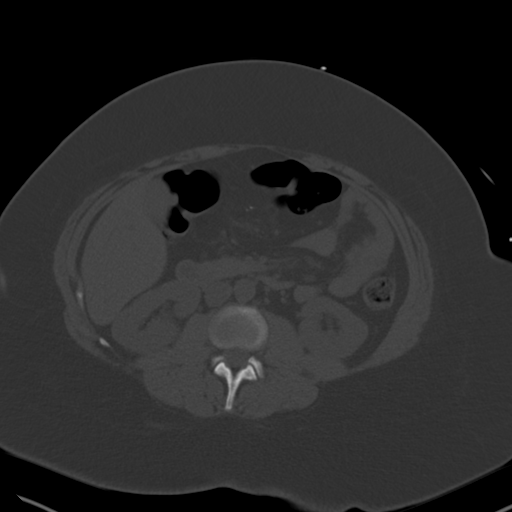
[im 63/89  soft-tissue]
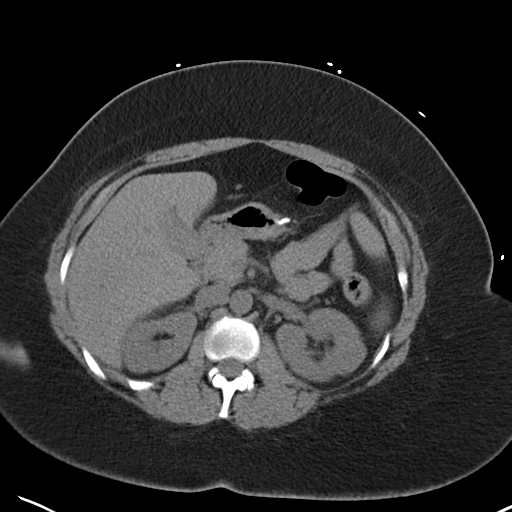
[im 68/89  soft-tissue]
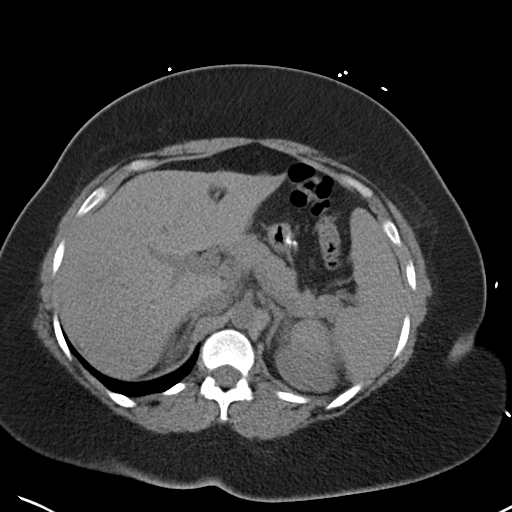
[im 78/89  soft-tissue]
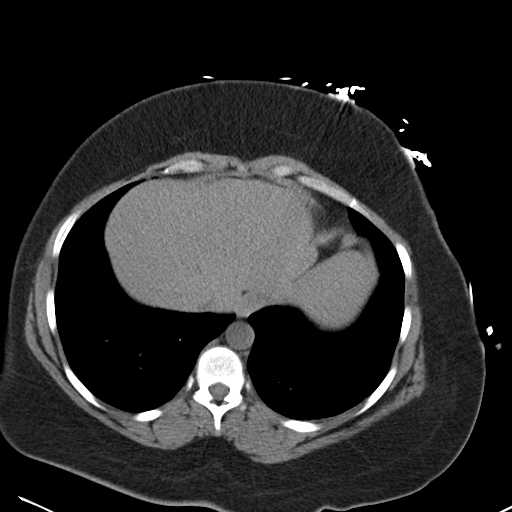
[im 83/89  soft-tissue]
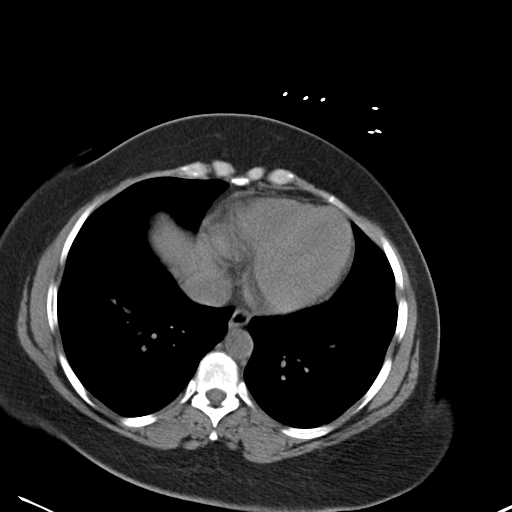

[Series 5: coronal st · coronal · 0.72mm/px · 3 of 153 slices shown]
[im 51/153  soft-tissue]
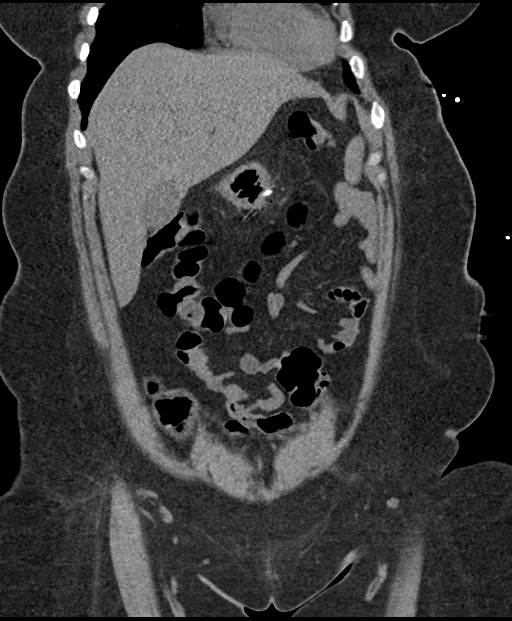
[im 68/153  soft-tissue]
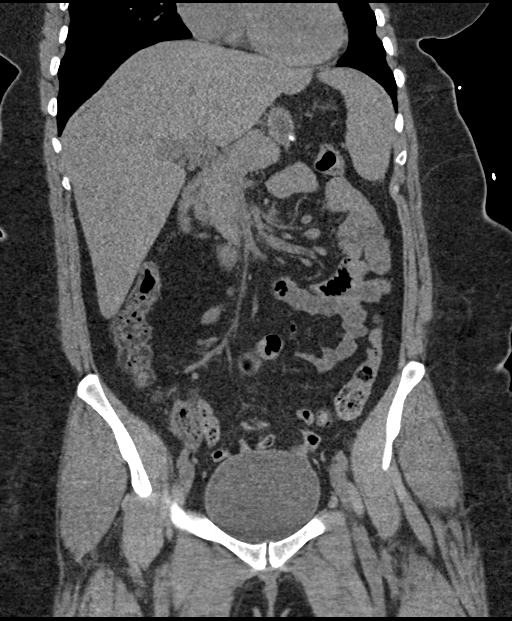
[im 85/153  soft-tissue]
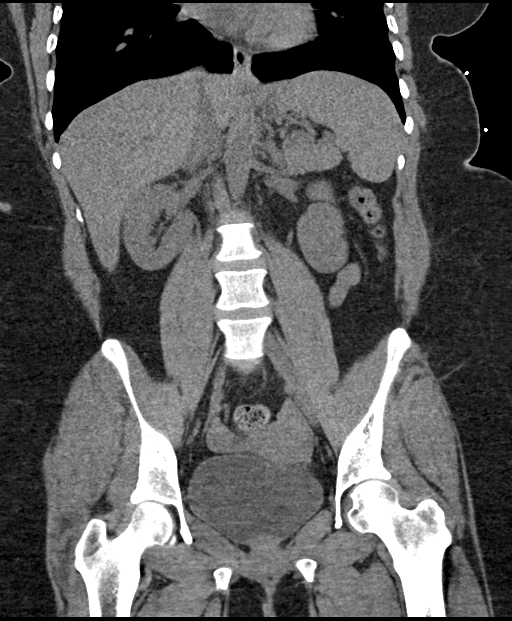

[16 of 46 positions shown; findings below may reference images not displayed]

FINDINGS: Lower chest: Lung bases demonstrate no acute consolidation or
effusion. Differential cardiac blood pool suggesting anemia.

Hepatobiliary: No focal liver abnormality is seen. No gallstones,
gallbladder wall thickening, or biliary dilatation.

Pancreas: Unremarkable. No pancreatic ductal dilatation or
surrounding inflammatory changes.

Spleen: Borderline to mildly enlarged.

Adrenals/Urinary Tract: Adrenal glands are unremarkable. Kidneys are
normal, without renal calculi, focal lesion, or hydronephrosis.
Bladder is unremarkable.

Stomach/Bowel: Postsurgical changes of the stomach consistent with
history of gastric sleeve surgery. No dilated small or large bowel.
Negative appendix.

Vascular/Lymphatic: No significant vascular findings are present. No
enlarged abdominal or pelvic lymph nodes.

Reproductive: Uterus and bilateral adnexa are unremarkable.

Other: Negative for free air or free fluid.

Musculoskeletal: No acute or significant osseous findings.
IMPRESSION: 1. Negative for acute intra-abdominal or pelvic abnormality.
2. Postsurgical changes of the stomach consistent with history of
gastric sleeve surgery.
3. Borderline splenomegaly.

## 2022-07-08 ENCOUNTER — Ambulatory Visit: Payer: BC Managed Care – PPO | Admitting: Nurse Practitioner

## 2022-08-11 ENCOUNTER — Encounter: Payer: Self-pay | Admitting: Nurse Practitioner

## 2022-09-09 ENCOUNTER — Encounter: Payer: Self-pay | Admitting: Nurse Practitioner

## 2022-09-09 ENCOUNTER — Ambulatory Visit (INDEPENDENT_AMBULATORY_CARE_PROVIDER_SITE_OTHER): Payer: Commercial Managed Care - HMO | Admitting: Nurse Practitioner

## 2022-09-09 VITALS — BP 120/84 | HR 70 | Temp 98.5°F | Ht 62.2 in | Wt 198.0 lb

## 2022-09-09 DIAGNOSIS — F5101 Primary insomnia: Secondary | ICD-10-CM | POA: Diagnosis not present

## 2022-09-09 DIAGNOSIS — E782 Mixed hyperlipidemia: Secondary | ICD-10-CM

## 2022-09-09 DIAGNOSIS — Z6835 Body mass index (BMI) 35.0-35.9, adult: Secondary | ICD-10-CM

## 2022-09-09 DIAGNOSIS — E6609 Other obesity due to excess calories: Secondary | ICD-10-CM | POA: Diagnosis not present

## 2022-09-09 DIAGNOSIS — R635 Abnormal weight gain: Secondary | ICD-10-CM | POA: Insufficient documentation

## 2022-09-09 DIAGNOSIS — E559 Vitamin D deficiency, unspecified: Secondary | ICD-10-CM | POA: Diagnosis not present

## 2022-09-09 DIAGNOSIS — Z0000 Encounter for general adult medical examination without abnormal findings: Secondary | ICD-10-CM | POA: Diagnosis not present

## 2022-09-09 DIAGNOSIS — Z2821 Immunization not carried out because of patient refusal: Secondary | ICD-10-CM

## 2022-09-09 HISTORY — DX: Abnormal weight gain: R63.5

## 2022-09-09 MED ORDER — PHENTERMINE HCL 15 MG PO CAPS: 15.0000 mg | ORAL_CAPSULE | ORAL | 1 refills | Status: DC

## 2022-09-09 NOTE — Assessment & Plan Note (Signed)
Long discussion about her weight loss which she has been hesitant and unable to lose the "last 20 lbs" in spite of her current exercise regimen.  Encouraged to increase her cardio for at least 10 to 20 minutes.  Also recommend to start her intermittent fast at a later time.  She is due call her bariatric provider as well to see if there are any other options.  Will start phentermine, discussed side effects and verbalized understanding. I have reviewed the PDMP during this encounter.

## 2022-09-09 NOTE — Assessment & Plan Note (Signed)
Will check vitamin D level and supplement as needed.    Also encouraged to spend 15 minutes in the sun daily.   

## 2022-09-09 NOTE — Assessment & Plan Note (Signed)
Behavior modifications discussed and diet history reviewed.   Pt will continue to exercise regularly and modify diet with low GI, plant based foods and decrease intake of processed foods.  Recommend intake of daily multivitamin, Vitamin D, and calcium.  Recommend mammogram and colonoscopy (done in 2023 and reports had polyps HM note to have redone in 2033 encourage patient to call provider to see if she needs to be seen prior to this time.) For preventive screenings, as well as recommend immunizations that include influenza, TDAP, and Shingles

## 2022-09-09 NOTE — Assessment & Plan Note (Signed)
Cholesterol levels were slightly elevated at previous provider.  Will get copy of records.  Continue focusing on low diet.

## 2022-09-09 NOTE — Assessment & Plan Note (Signed)
She has been having inability to lose the last 20 pounds will check thyroid levels

## 2022-09-09 NOTE — Progress Notes (Signed)
Madelaine Bhat, CMA,acting as a Neurosurgeon for Arnette Felts, FNP.,have documented all relevant documentation on the behalf of Arnette Felts, FNP,as directed by  Arnette Felts, FNP while in the presence of Arnette Felts, FNP.  Subjective:    Patient ID: Cindy Gilbert , female    DOB: [birth year] , 47 y.o.   MRN: [id number]  Chief Complaint  Patient presents with   Annual Exam    HPI  Patient presents today for HM. She is followed by Dr.Cousins for her [facility].  Patient reports compliance with medications, patient denies any chest pains, SOB, or headaches. Patient has no other concerns today.   Wt Readings from Last 3 Encounters: 09/09/22 : 198 lb (89.8 kg) 05/28/22 : 200 lb (90.7 kg) 04/22/22 : 196 lb (88.9 kg)  3 days a week with weights with trainer, daily walk 15 minutes and 2 miles. Pilates is twice a week for one hour. She has knee problems.   Breakfast - fasting until 11 or 12 with coffee in am (no creamer) Lunch - pasta with tofu with carrots, yesterday - chicken, rice and beans.  Dinner - does not snack, does not drink sodas or juices.  She is aiming to get her weight down to 165-170.  She does not snore that she is aware of. She does feel tired in am. She can fall asleep easily She has been tested for sleep apnea which was normal - 2021. Had done in [place]. She was 70 lbs heavier at that time. In 2022 had gastric sleeve, she is still followed by her bariatric provider once a year.      Past Medical History:  Diagnosis Date   Allergy    Seasonal   Anxiety 03/22/2018   Autoimmune hemolytic anemia (HCC) 04/10/2020   Chronic right hip pain 03/05/2020   Controlled substance agreement signed 12/18/2016   For xanax   COVID-19    Depression    previously on lexapro, celexa,    Dyslipidemia    Heart murmur 12/27/2019   Hip pain, chronic    Right   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/09/2020   Insomnia    Mixed hyperlipidemia 12/06/2019   Morbid obesity (HCC) 10/15/2019   Multinodular  goiter (nontoxic) 03/16/2019   Small nodules- no need for bx or follow up imaging 2020   Ovarian cyst    left   Prediabetes 03/05/2020   Right knee pain    S/P laparoscopic sleeve gastrectomy 03/05/2020   Severe obesity (BMI >= 40) (HCC) 10/15/2019   Vitamin D deficiency      Family History  Problem Relation Age of Onset   Hypertension Mother    Hyperlipidemia Mother    Dementia Mother    Heart disease Father    Stroke Father    Heart attack Father    Hypertension Father    Heart murmur Maternal Grandmother    Asthma Paternal Grandmother    Atrial fibrillation Maternal Aunt    Cancer Paternal Aunt        Breast     Current Outpatient Medications:    BIOTIN PO, Take by mouth., Disp: , Rfl:    Calcium Carb-Cholecalciferol (CALCIUM 500 + D PO), Take 500 mg by mouth 3 (three) times daily., Disp: , Rfl:    Cyanocobalamin (B-12 PO), Take by mouth., Disp: , Rfl:    fluticasone (FLONASE) 50 MCG/ACT nasal spray, Place 2 sprays into both nostrils daily as needed for allergies., Disp: 16 g, Rfl: 12   Multiple Vitamins-Minerals (MULTIVITAMIN  WITH MINERALS) tablet, Take 1 tablet by mouth daily., Disp: , Rfl:    phentermine 15 MG capsule, Take 1 capsule (15 mg total) by mouth every morning., Disp: 30 capsule, Rfl: 1   Allergies  Allergen Reactions   Ambien [Zolpidem Tartrate] Itching   Belviq [Lorcaserin] Itching   Bupropion Itching   Cymbalta [Duloxetine Hcl] Other (See Comments)    Headache      The patient states she uses post menopausal status for birth control. No LMP recorded. (Menstrual status: Perimenopausal).  Negative for: breast discharge, breast lump(s), breast pain and breast self exam. Associated symptoms include abnormal vaginal bleeding. Pertinent negatives include abnormal bleeding (hematology), anxiety, decreased libido, depression, difficulty falling sleep, dyspareunia, history of infertility, nocturia, sexual dysfunction, sleep disturbances, urinary incontinence,  urinary urgency, vaginal discharge and vaginal itching. Diet regular.  Does admit to not eating enough vegetables. The patient states her exercise level is moderate with 3-5 days a week, weight training, walking and pilates.   The patient's tobacco use is:  Social History   Tobacco Use  Smoking Status Former   Current packs/day: 0.00   Average packs/day: 0.3 packs/day for 10.0 years (2.5 ttl pk-yrs)   Types: Cigarettes   Start date: 02/24/2004   Quit date: 02/23/2014   Years since quitting: 8.5  Smokeless Tobacco Never  . She has been exposed to passive smoke. The patient's alcohol use is:  Social History   Substance and Sexual Activity  Alcohol Use Yes   Alcohol/week: 5.0 standard drinks of alcohol   Types: 5 Glasses of wine per week  . Additional information: Last pap 05/29/2021, next one scheduled for 05/29/2024.    Review of Systems  Constitutional: Negative.   HENT: Negative.    Eyes: Negative.   Respiratory: Negative.    Cardiovascular: Negative.   Gastrointestinal: Negative.   Endocrine: Negative.   Genitourinary: Negative.   Musculoskeletal: Negative.   Skin: Negative.   Allergic/Immunologic: Negative.   Neurological: Negative.   Hematological: Negative.   Psychiatric/Behavioral: Negative.       Today's Vitals   09/09/22 1410  BP: 120/84  Pulse: 70  Temp: 98.5 F (36.9 C)  Weight: 198 lb (89.8 kg)  Height: 5' 2.2" (1.58 m)  PainSc: 0-No pain   Body mass index is 35.98 kg/m.  Wt Readings from Last 3 Encounters:  09/09/22 198 lb (89.8 kg)  05/28/22 200 lb (90.7 kg)  04/22/22 196 lb (88.9 kg)     Objective:  Physical Exam Vitals reviewed.  Constitutional:      General: She is not in acute distress.    Appearance: Normal appearance. She is well-developed. She is obese.  HENT:     Head: Normocephalic and atraumatic.     Right Ear: Hearing, tympanic membrane, ear canal and external ear normal. There is no impacted cerumen.     Left Ear: Hearing, tympanic  membrane, ear canal and external ear normal. There is no impacted cerumen.     Nose: Nose normal.     Mouth/Throat:     Mouth: Mucous membranes are moist.  Eyes:     General: Lids are normal.     Extraocular Movements: Extraocular movements intact.     Conjunctiva/sclera: Conjunctivae normal.     Pupils: Pupils are equal, round, and reactive to light.     Funduscopic exam:    Right eye: No papilledema.        Left eye: No papilledema.  Neck:     Thyroid: No thyroid mass.  Vascular: No carotid bruit.  Cardiovascular:     Rate and Rhythm: Normal rate and regular rhythm.     Pulses: Normal pulses.     Heart sounds: Normal heart sounds. No murmur heard. Pulmonary:     Effort: Pulmonary effort is normal. No respiratory distress.     Breath sounds: Normal breath sounds. No wheezing.  Chest:     Chest wall: No mass.  Breasts:    Tanner Score is 5.     Right: Normal. No mass or tenderness.     Left: Normal. No mass or tenderness.  Abdominal:     General: Abdomen is flat. Bowel sounds are normal. There is no distension.     Palpations: Abdomen is soft.     Tenderness: There is no abdominal tenderness.  Genitourinary:    Rectum: Guaiac result negative.  Musculoskeletal:        General: No swelling. Normal range of motion.     Cervical back: Full passive range of motion without pain, normal range of motion and neck supple.     Right lower leg: No edema.     Left lower leg: No edema.  Lymphadenopathy:     Upper Body:     Right upper body: No supraclavicular, axillary or pectoral adenopathy.     Left upper body: No supraclavicular, axillary or pectoral adenopathy.  Skin:    General: Skin is warm and dry.     Capillary Refill: Capillary refill takes less than 2 seconds.  Neurological:     General: No focal deficit present.     Mental Status: She is alert and oriented to person, place, and time.     Cranial Nerves: No cranial nerve deficit.     Sensory: No sensory deficit.      Motor: No weakness.  Psychiatric:        Mood and Affect: Mood normal.        Behavior: Behavior normal.        Thought Content: Thought content normal.        Judgment: Judgment normal.         Assessment And Plan:     Encounter for annual health examination Assessment & Plan: Behavior modifications discussed and diet history reviewed.   Pt will continue to exercise regularly and modify diet with low GI, plant based foods and decrease intake of processed foods.  Recommend intake of daily multivitamin, Vitamin D, and calcium.  Recommend mammogram and colonoscopy (done in 2023 and reports had polyps HM note to have redone in 2033 encourage patient to call provider to see if she needs to be seen prior to this time.) For preventive screenings, as well as recommend immunizations that include influenza, TDAP, and Shingles    Mixed hyperlipidemia Assessment & Plan: Cholesterol levels were slightly elevated at previous provider.  Will get copy of records.  Continue focusing on low diet.   Primary insomnia Assessment & Plan: Currently stable.   Vitamin D deficiency Assessment & Plan: Will check vitamin D level and supplement as needed.    Also encouraged to spend 15 minutes in the sun daily.     Tetanus, diphtheria, and acellular pertussis (Tdap) vaccination declined  Weight gain Assessment & Plan: She has been having inability to lose the last 20 pounds will check thyroid levels  Orders: -     CBC with Differential/Platelet -     TSH -     Insulin, random -     Phentermine HCl; Take  1 capsule (15 mg total) by mouth every morning.  Dispense: 30 capsule; Refill: 1  Class 2 obesity due to excess calories without serious comorbidity with body mass index (BMI) of 35.0 to 35.9 in adult Assessment & Plan: Long discussion about her weight loss which she has been hesitant and unable to lose the "last 20 lbs" in spite of her current exercise regimen.  Encouraged to increase her  cardio for at least 10 to 20 minutes.  Also recommend to start her intermittent fast at a later time.  She is due call her bariatric provider as well to see if there are any other options.  Will start phentermine, discussed side effects and verbalized understanding. I have reviewed the PDMP during this encounter.   Orders: -     Phentermine HCl; Take 1 capsule (15 mg total) by mouth every morning.  Dispense: 30 capsule; Refill: 1    Return for 1 year physical, 6 month chol check. Patient was given opportunity to ask questions. Patient verbalized understanding of the plan and was able to repeat key elements of the plan. All questions were answered to their satisfaction.   Arnette Felts, FNP  I, Arnette Felts, FNP, have reviewed all documentation for this visit. The documentation on 09/09/22 for the exam, diagnosis, procedures, and orders are all accurate and complete.

## 2022-09-09 NOTE — Assessment & Plan Note (Signed)
 Currently stable.

## 2022-09-10 LAB — CBC WITH DIFFERENTIAL/PLATELET
Basophils Absolute: 0 10*3/uL (ref 0.0–0.2)
Basos: 1 %
EOS (ABSOLUTE): 0.2 10*3/uL (ref 0.0–0.4)
Eos: 3 %
Hematocrit: 39.2 % (ref 34.0–46.6)
Hemoglobin: 13 g/dL (ref 11.1–15.9)
Immature Grans (Abs): 0 10*3/uL (ref 0.0–0.1)
Immature Granulocytes: 0 %
Lymphocytes Absolute: 3.1 10*3/uL (ref 0.7–3.1)
Lymphs: 53 %
MCH: 28.3 pg (ref 26.6–33.0)
MCHC: 33.2 g/dL (ref 31.5–35.7)
MCV: 85 fL (ref 79–97)
Monocytes Absolute: 0.4 10*3/uL (ref 0.1–0.9)
Monocytes: 7 %
Neutrophils Absolute: 2.1 10*3/uL (ref 1.4–7.0)
Neutrophils: 36 %
Platelets: 231 10*3/uL (ref 150–450)
RBC: 4.59 x10E6/uL (ref 3.77–5.28)
RDW: 13 % (ref 11.7–15.4)
WBC: 5.7 10*3/uL (ref 3.4–10.8)

## 2022-09-10 LAB — TSH: TSH: 1.35 u[IU]/mL (ref 0.450–4.500)

## 2022-09-10 LAB — INSULIN, RANDOM: INSULIN: 6.3 u[IU]/mL (ref 2.6–24.9)

## 2022-11-10 ENCOUNTER — Ambulatory Visit: Payer: Commercial Managed Care - HMO | Admitting: Sports Medicine

## 2022-11-10 NOTE — Progress Notes (Unsigned)
Madelaine Bhat, CMA,acting as a Neurosurgeon for Arnette Felts, FNP.,have documented all relevant documentation on the behalf of Arnette Felts, FNP,as directed by  Arnette Felts, FNP while in the presence of Arnette Felts, FNP.  Subjective:  Patient ID: Cindy Gilbert , female    DOB: May 11, 1975 , 47 y.o.   MRN: [id number]  No chief complaint on file.   HPI  Patient presents today for a weight follow up, patient reports compliance with medications. Patient denies any chest pain, SOB, or headaches. Patient has no concerns today. Patient reports she doesn't think the phentermine 15mg  is really working for her. She has been forgetting to take the phentermine averages 3-4 days. She is exercising 3-4 days a week with pilates, yoga and walking (20 minutes to 1 hour - 5 days).  She eats mainly protein and vegetable. She has been cutting out carbs. During her intermittent fasting window she is good with the phentermine. She does not eat until 12p-8p. She has a limited amount of water intake. She does drink coffee, tea and wine. She will drink 2-4 glasses daily. Has been ongoing for a few years. She is currently in counseling. Myrtis Ser in [place], she talks with her weekly, she is an addiction specialist her phone number is [phone].   BP Readings from Last 3 Encounters: 11/12/22 : 120/70 11/12/22 : 118/78 09/09/22 : 120/84   Wt Readings from Last 3 Encounters: 11/12/22 : 196 lb 12.8 oz (89.3 kg) 11/12/22 : 195 lb (88.5 kg) 09/09/22 : 198 lb (89.8 kg)       Past Medical History:  Diagnosis Date   Allergy    Seasonal   Anxiety 03/22/2018   Autoimmune hemolytic anemia (HCC) 04/10/2020   Chronic right hip pain 03/05/2020   Controlled substance agreement signed 12/18/2016   For xanax   COVID-19    Depression    previously on lexapro, celexa,    Dyslipidemia    Heart murmur 12/27/2019   Hip pain, chronic    Right   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/09/2020   Insomnia    Mixed hyperlipidemia  12/06/2019   Morbid obesity (HCC) 10/15/2019   Multinodular goiter (nontoxic) 03/16/2019   Small nodules- no need for bx or follow up imaging 2020   Ovarian cyst    left   Prediabetes 03/05/2020   Right knee pain    S/P laparoscopic sleeve gastrectomy 03/05/2020   Severe obesity (BMI >= 40) (HCC) 10/15/2019   Vitamin D deficiency    Weight gain 09/09/2022     Family History  Problem Relation Age of Onset   Hypertension Mother    Hyperlipidemia Mother    Dementia Mother    Heart disease Father    Stroke Father    Heart attack Father    Hypertension Father    Heart murmur Maternal Grandmother    Asthma Paternal Grandmother    Atrial fibrillation Maternal Aunt    Cancer Paternal Aunt        Breast     Current Outpatient Medications:    BIOTIN PO, Take by mouth., Disp: , Rfl:    Calcium Carb-Cholecalciferol (CALCIUM 500 + D PO), Take 500 mg by mouth 3 (three) times daily., Disp: , Rfl:    Cyanocobalamin (B-12 PO), Take by mouth., Disp: , Rfl:    fluticasone (FLONASE) 50 MCG/ACT nasal spray, Place 2 sprays into both nostrils daily as needed for allergies., Disp: 16 g, Rfl: 12   Multiple Vitamins-Minerals (MULTIVITAMIN WITH MINERALS) tablet, Take 1  tablet by mouth daily., Disp: , Rfl:    phentermine 37.5 MG capsule, Take 1 capsule (37.5 mg total) by mouth every morning., Disp: 30 capsule, Rfl: 1   Allergies  Allergen Reactions   Ambien [Zolpidem Tartrate] Itching   Belviq [Lorcaserin] Itching   Bupropion Itching   Cymbalta [Duloxetine Hcl] Other (See Comments)    Headache     Review of Systems  Constitutional: Negative.   HENT: Negative.    Eyes: Negative.   Respiratory: Negative.    Cardiovascular: Negative.   Gastrointestinal: Negative.   Neurological: Negative.   Psychiatric/Behavioral: Negative.       Today's Vitals   11/12/22 1205  BP: 120/70  Pulse: 70  Temp: 98.7 F (37.1 C)  TempSrc: Oral  Weight: 196 lb 12.8 oz (89.3 kg)  Height: 5\' 2"  (1.575 m)   PainSc: 0-No pain   Body mass index is 36 kg/m.  Wt Readings from Last 3 Encounters:  11/12/22 196 lb 12.8 oz (89.3 kg)  11/12/22 195 lb (88.5 kg)  09/09/22 198 lb (89.8 kg)     Objective:  Physical Exam Vitals reviewed.  Constitutional:      General: She is not in acute distress.    Appearance: Normal appearance.  Cardiovascular:     Rate and Rhythm: Normal rate and regular rhythm.     Pulses: Normal pulses.     Heart sounds: Normal heart sounds. No murmur heard. Pulmonary:     Effort: Pulmonary effort is normal. No respiratory distress.     Breath sounds: Normal breath sounds. No wheezing.  Skin:    General: Skin is warm and dry.     Capillary Refill: Capillary refill takes less than 2 seconds.  Neurological:     General: No focal deficit present.     Mental Status: She is alert and oriented to person, place, and time.     Cranial Nerves: No cranial nerve deficit.     Motor: No weakness.         Assessment And Plan:  Class 2 obesity due to excess calories with body mass index (BMI) of 36.0 to 36.9 in adult, unspecified whether serious comorbidity present Assessment & Plan: Weight remains about the same,  likely related to drinking excessive amounts of wine and not taking phentermine regularly. Will increase to see if more effective. Encouraged to cut back on wine slowly.   Orders: -     Phentermine HCl; Take 1 capsule (37.5 mg total) by mouth every morning.  Dispense: 30 capsule; Refill: 1  Alcohol abuse Assessment & Plan: Called Myrtis Ser (counselor) to discuss possible treatment plan for her alcohol abuse, left voicemail to return call. Will consider treatment with naltrexone with close monitoring in collaboration with her counselor    Encounter for Papanicolaou smear of cervix Assessment & Plan: Abstracted today     Return for 2 month weight check.  Patient was given opportunity to ask questions. Patient verbalized understanding of the plan and was  able to repeat key elements of the plan. All questions were answered to their satisfaction.    Jeanell Sparrow, FNP, have reviewed all documentation for this visit. The documentation on 11/12/22 for the exam, diagnosis, procedures, and orders are all accurate and complete.   IF YOU HAVE BEEN REFERRED TO A SPECIALIST, IT MAY TAKE 1-2 WEEKS TO SCHEDULE/PROCESS THE REFERRAL. IF YOU HAVE NOT HEARD FROM US/SPECIALIST IN TWO WEEKS, PLEASE GIVE [place] A CALL AT [phone] X [phone].

## 2022-11-12 ENCOUNTER — Ambulatory Visit: Payer: Self-pay

## 2022-11-12 ENCOUNTER — Ambulatory Visit (INDEPENDENT_AMBULATORY_CARE_PROVIDER_SITE_OTHER): Payer: Commercial Managed Care - HMO | Admitting: Nurse Practitioner

## 2022-11-12 ENCOUNTER — Ambulatory Visit: Payer: Commercial Managed Care - HMO | Admitting: Family Medicine

## 2022-11-12 ENCOUNTER — Encounter: Payer: Self-pay | Admitting: Nurse Practitioner

## 2022-11-12 VITALS — BP 118/78 | Ht 62.5 in | Wt 195.0 lb

## 2022-11-12 VITALS — BP 120/70 | HR 70 | Temp 98.7°F | Ht 62.0 in | Wt 196.8 lb

## 2022-11-12 DIAGNOSIS — E6609 Other obesity due to excess calories: Secondary | ICD-10-CM | POA: Diagnosis not present

## 2022-11-12 DIAGNOSIS — F1010 Alcohol abuse, uncomplicated: Secondary | ICD-10-CM

## 2022-11-12 DIAGNOSIS — M79645 Pain in left finger(s): Secondary | ICD-10-CM | POA: Diagnosis not present

## 2022-11-12 DIAGNOSIS — Z6836 Body mass index (BMI) 36.0-36.9, adult: Secondary | ICD-10-CM | POA: Diagnosis not present

## 2022-11-12 DIAGNOSIS — Z124 Encounter for screening for malignant neoplasm of cervix: Secondary | ICD-10-CM | POA: Diagnosis not present

## 2022-11-12 MED ORDER — PHENTERMINE HCL 37.5 MG PO CAPS
37.5000 mg | ORAL_CAPSULE | ORAL | 1 refills | Status: DC
Start: 2022-11-12 — End: 2023-04-26

## 2022-11-12 NOTE — Assessment & Plan Note (Signed)
Abstracted today

## 2022-11-12 NOTE — Assessment & Plan Note (Signed)
Weight remains about the same,  likely related to drinking excessive amounts of wine and not taking phentermine regularly. Will increase to see if more effective. Encouraged to cut back on wine slowly.

## 2022-11-12 NOTE — Progress Notes (Signed)
PCP: Arnette Felts, FNP  Subjective:   HPI: Patient is a 47 y.o. female here for left pointer finger pain.  Patient is a very pleasant 47 year old presenting for 2 months of intermittent, sharp/achy, nonradiating pain in her DIP and PIP joints of her left second digit.  This occurred after she had some kind of incident while attempting to get back on a tube while tubing down a river.  She is unsure of the mechanism in which her finger bent at that time.  She denies initial large amounts of swelling or bruising.  The pain was worse initially and is slowly gotten better over time but has not improved fully.  She is concerned because she does not have full range of motion at her PIP.  Past Medical History:  Diagnosis Date   Allergy    Seasonal   Anxiety 03/22/2018   Autoimmune hemolytic anemia (HCC) 04/10/2020   Chronic right hip pain 03/05/2020   Controlled substance agreement signed 12/18/2016   For xanax   COVID-19    Depression    previously on lexapro, celexa,    Dyslipidemia    Heart murmur 12/27/2019   Hip pain, chronic    Right   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/09/2020   Insomnia    Mixed hyperlipidemia 12/06/2019   Morbid obesity (HCC) 10/15/2019   Multinodular goiter (nontoxic) 03/16/2019   Small nodules- no need for bx or follow up imaging 2020   Ovarian cyst    left   Prediabetes 03/05/2020   Right knee pain    S/P laparoscopic sleeve gastrectomy 03/05/2020   Severe obesity (BMI >= 40) (HCC) 10/15/2019   Vitamin D deficiency     Current Outpatient Medications on File Prior to Visit  Medication Sig Dispense Refill   BIOTIN PO Take by mouth.     Calcium Carb-Cholecalciferol (CALCIUM 500 + D PO) Take 500 mg by mouth 3 (three) times daily.     Cyanocobalamin (B-12 PO) Take by mouth.     fluticasone (FLONASE) 50 MCG/ACT nasal spray Place 2 sprays into both nostrils daily as needed for allergies. 16 g 12   Multiple Vitamins-Minerals (MULTIVITAMIN WITH MINERALS) tablet Take 1 tablet by mouth  daily.     phentermine 15 MG capsule Take 1 capsule (15 mg total) by mouth every morning. 30 capsule 1   No current facility-administered medications on file prior to visit.    Past Surgical History:  Procedure Laterality Date   ADENOIDECTOMY     CESAREAN SECTION  2001   COLONOSCOPY WITH PROPOFOL N/A 02/25/2021   Procedure: COLONOSCOPY WITH PROPOFOL;  Surgeon: Toney Reil, MD;  Location: [facility];  Service: Endoscopy;  Laterality: N/A;   DILATION AND CURETTAGE OF UTERUS     LAPAROSCOPIC GASTRIC SLEEVE RESECTION N/A 03/05/2020   Procedure: LAPAROSCOPIC GASTRIC SLEEVE RESECTION;  Surgeon: Gaynelle Adu, MD;  Location: [facility];  Service: General;  Laterality: N/A;   TONSILLECTOMY     UPPER GI ENDOSCOPY N/A 03/05/2020   Procedure: UPPER GI ENDOSCOPY;  Surgeon: Gaynelle Adu, MD;  Location: [facility];  Service: General;  Laterality: N/A;    Allergies  Allergen Reactions   Ambien [Zolpidem Tartrate] Itching   Belviq [Lorcaserin] Itching   Bupropion Itching   Cymbalta [Duloxetine Hcl] Other (See Comments)    Headache    There were no vitals taken for this visit.      No data to display              No  data to display              Objective:  Physical Exam:  Gen: NAD, comfortable in exam room  Left second digit Inspection: Second DIP and PIP joints with mild swelling on the radial side No bruising, erythema, bony deformity Palpation: Tenderness palpation over the radial aspects of both PIP and DIP joints Range of Motion: Full range of motion of DIP in comparison to right second digit, 0 to 90 degrees PIP flexion limited to 70 degrees in comparison to right second digit 110 degrees, full extension Special Tests Negative varus and valgus stress test of ulnar and radial collateral ligaments of DIP and PIP Strength: Strength 5 out of 5 in DIP flexion and extension Strength 5 out of 5 in PIP flexion extension Neurovascular: Capillary refill less than 2  seconds  Limited ultrasound of left hand: No cortical irregularity identified along second digit including proximal middle and distal phalanxes.  Mild hypoechoic changes at radial side of PIP.  Flexor and extensor tendons intact without sign of hyper or hypoechoic changes.  Impression: No evidence suggestive of fracture.  Lateral hypoechoic changes at radial side of the PIP suggestive of sprain of the radial collateral ligament.  No evidence suggestive of flexor or extensor tendon damage.  Assessment & Plan:   Assessment & Plan Pain of finger of left hand Suspect that patient has grade 3 sprain of left second digit PIP radial collateral ligament likely due to an injury with forced ulnar deviation of the second finger.  Suspect similar injury to DIP with less severe sprain.  Patient is continued MIP joint effusion seen on ultrasound and on exam is likely the cause of patient's limited range of motion.  Discussed with patient that this will likely improve as the sprain continues to heal over the next 8 weeks.  Counseled on buddy taping with activities and other exercise.  Tylenol and ibuprofen for pain as needed.  Discussed continuing range of motion exercises.  Will additionally order x-ray of left second digit to completely rule out fracture.

## 2022-11-12 NOTE — Patient Instructions (Signed)
Get x-rays after you leave today. Your ultrasound is reassuring otherwise. You sprained the ligament on the inside of your index finger. We can see that it's healing but it's not completely healed yet. Buddy taping with activities, exercise until we see you back (if bothering you enough consider doing this regularly except when showering and icing). Tylenol, ibuprofen only if needed. Activities as tolerated. Follow up with [place] in about 8 weeks unless 95%+ improved as expected.

## 2022-11-12 NOTE — Assessment & Plan Note (Addendum)
Called Myrtis Ser (counselor) to discuss possible treatment plan for her alcohol abuse, left voicemail to return call. Will consider treatment with naltrexone with close monitoring in collaboration with her counselor

## 2022-11-12 NOTE — Assessment & Plan Note (Signed)
Weight has been stable however the phentermine at the current dose has not helped significantly. She is also drinking excessive amounts of wine which can hinder her weight loss. I did increase the dose however to see if this helps.

## 2022-11-14 ENCOUNTER — Encounter: Payer: Self-pay | Admitting: Family Medicine

## 2022-12-23 ENCOUNTER — Encounter: Payer: Self-pay | Admitting: Nurse Practitioner

## 2022-12-23 ENCOUNTER — Other Ambulatory Visit: Payer: Self-pay | Admitting: Nurse Practitioner

## 2022-12-23 DIAGNOSIS — F1010 Alcohol abuse, uncomplicated: Secondary | ICD-10-CM

## 2022-12-23 MED ORDER — NALTREXONE HCL 50 MG PO TABS
50.0000 mg | ORAL_TABLET | Freq: Every day | ORAL | 2 refills | Status: DC
Start: 2022-12-23 — End: 2023-04-26

## 2023-03-18 ENCOUNTER — Ambulatory Visit: Payer: Self-pay | Admitting: Nurse Practitioner

## 2023-03-18 NOTE — Progress Notes (Deleted)
Madelaine Bhat, CMA,acting as a Neurosurgeon for Arnette Felts, FNP.,have documented all relevant documentation on the behalf of Arnette Felts, FNP,as directed by  Arnette Felts, FNP while in the presence of Arnette Felts, FNP.  Subjective:  Patient ID: Cindy Gilbert , female    DOB: [birth year] , 48 y.o.   MRN: [id number]  No chief complaint on file.   HPI  Patient presents today for a chol follow up, Patient reports compliance with medication. Patient denies any chest pain, SOB, or headaches. Patient has no concerns today.     Past Medical History:  Diagnosis Date  . Allergy    Seasonal  . Anxiety 03/22/2018  . Autoimmune hemolytic anemia (HCC) 04/10/2020  . Chronic right hip pain 03/05/2020  . Controlled substance agreement signed 12/18/2016   For xanax  . COVID-19   . Depression    previously on lexapro, celexa,   . Dyslipidemia   . Heart murmur 12/27/2019  . Hip pain, chronic    Right  . 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/09/2020  . Insomnia   . Mixed hyperlipidemia 12/06/2019  . Morbid obesity (HCC) 10/15/2019  . Multinodular goiter (nontoxic) 03/16/2019   Small nodules- no need for bx or follow up imaging 2020  . Ovarian cyst    left  . Prediabetes 03/05/2020  . Right knee pain   . S/P laparoscopic sleeve gastrectomy 03/05/2020  . Severe obesity (BMI >= 40) (HCC) 10/15/2019  . Vitamin D deficiency   . Weight gain 09/09/2022     Family History  Problem Relation Age of Onset  . Hypertension Mother   . Hyperlipidemia Mother   . Dementia Mother   . Heart disease Father   . Stroke Father   . Heart attack Father   . Hypertension Father   . Heart murmur Maternal Grandmother   . Asthma Paternal Grandmother   . Atrial fibrillation Maternal Aunt   . Cancer Paternal Aunt        Breast     Current Outpatient Medications:  .  BIOTIN PO, Take by mouth., Disp: , Rfl:  .  Calcium Carb-Cholecalciferol (CALCIUM 500 + D PO), Take 500 mg by mouth 3 (three) times daily., Disp: , Rfl:  .   Cyanocobalamin (B-12 PO), Take by mouth., Disp: , Rfl:  .  fluticasone (FLONASE) 50 MCG/ACT nasal spray, Place 2 sprays into both nostrils daily as needed for allergies., Disp: 16 g, Rfl: 12 .  Multiple Vitamins-Minerals (MULTIVITAMIN WITH MINERALS) tablet, Take 1 tablet by mouth daily., Disp: , Rfl:  .  naltrexone (DEPADE) 50 MG tablet, Take 1 tablet (50 mg total) by mouth daily., Disp: 30 tablet, Rfl: 2 .  phentermine 37.5 MG capsule, Take 1 capsule (37.5 mg total) by mouth every morning., Disp: 30 capsule, Rfl: 1   Allergies  Allergen Reactions  . Ambien [Zolpidem Tartrate] Itching  . Belviq [Lorcaserin] Itching  . Bupropion Itching  . Cymbalta [Duloxetine Hcl] Other (See Comments)    Headache     Review of Systems   There were no vitals filed for this visit. There is no height or weight on file to calculate BMI.  Wt Readings from Last 3 Encounters:  11/12/22 196 lb 12.8 oz (89.3 kg)  11/12/22 195 lb (88.5 kg)  09/09/22 198 lb (89.8 kg)    The 10-year ASCVD risk score (Arnett DK, et al., 2019) is: 0.7%   Values used to calculate the score:     Age: 65 years     Sex:  Female     Is Non-Hispanic African American: Yes     Diabetic: No     Tobacco smoker: No     Systolic Blood Pressure: 120 mmHg     Is BP treated: No     HDL Cholesterol: 71 mg/dL     Total Cholesterol: 224 mg/dL  Objective:  Physical Exam      Assessment And Plan:  Mixed hyperlipidemia  Vitamin D deficiency  Primary insomnia    No follow-ups on file.  Patient was given opportunity to ask questions. Patient verbalized understanding of the plan and was able to repeat key elements of the plan. All questions were answered to their satisfaction.    Jeanell Sparrow, FNP, have reviewed all documentation for this visit. The documentation on 03/18/23 for the exam, diagnosis, procedures, and orders are all accurate and complete.   IF YOU HAVE BEEN REFERRED TO A SPECIALIST, IT MAY TAKE 1-2 WEEKS TO  SCHEDULE/PROCESS THE REFERRAL. IF YOU HAVE NOT HEARD FROM US/SPECIALIST IN TWO WEEKS, PLEASE GIVE [place] A CALL AT [phone].

## 2023-04-15 NOTE — Progress Notes (Unsigned)
[facility] [address] [place] [address] Phone: [phone] Subjective:   Cindy Gilbert am a scribe for Dr. Katrinka Blazing.   I'm seeing this patient by the request  of:  Arnette Felts, FNP  CC: left finger pain   [id number]  Cindy Gilbert is a 48 y.o. female coming in with complaint of L finger pain   Patient saw another sports medicine provider in September for this.  Was diagnosed with a grade 3 sprain of the left second digit of the PIP patient did have an x-ray ordered but never done.  Ultrasound did show effusion noted.  Patient states pointer finger on left hand. Went tubing and the finger went sideways and it has been painful since then. Incident happened in July 2024. Not taking any pain medication at this time. Not able to bend the finger to full ROM. Has tried finger exercises to try and strengthen the muscles (for about two months now).  Past Medical History:  Diagnosis Date   Allergy    Seasonal   Anxiety 03/22/2018   Autoimmune hemolytic anemia (HCC) 04/10/2020   Chronic right hip pain 03/05/2020   Controlled substance agreement signed 12/18/2016   For xanax   COVID-19    Depression    previously on lexapro, celexa,    Dyslipidemia    Heart murmur 12/27/2019   Hip pain, chronic    Right   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/09/2020   Insomnia    Mixed hyperlipidemia 12/06/2019   Morbid obesity (HCC) 10/15/2019   Multinodular goiter (nontoxic) 03/16/2019   Small nodules- no need for bx or follow up imaging 2020   Ovarian cyst    left   Prediabetes 03/05/2020   Right knee pain    S/P laparoscopic sleeve gastrectomy 03/05/2020   Severe obesity (BMI >= 40) (HCC) 10/15/2019   Vitamin D deficiency    Weight gain 09/09/2022   Past Surgical History:  Procedure Laterality Date   ADENOIDECTOMY     CESAREAN SECTION  2001   COLONOSCOPY WITH PROPOFOL N/A 02/25/2021   Procedure: COLONOSCOPY WITH PROPOFOL;  Surgeon: Toney Reil, MD;   Location: [facility];  Service: Endoscopy;  Laterality: N/A;   DILATION AND CURETTAGE OF UTERUS     LAPAROSCOPIC GASTRIC SLEEVE RESECTION N/A 03/05/2020   Procedure: LAPAROSCOPIC GASTRIC SLEEVE RESECTION;  Surgeon: Gaynelle Adu, MD;  Location: [facility];  Service: General;  Laterality: N/A;   TONSILLECTOMY     UPPER GI ENDOSCOPY N/A 03/05/2020   Procedure: UPPER GI ENDOSCOPY;  Surgeon: Gaynelle Adu, MD;  Location: [facility];  Service: General;  Laterality: N/A;   Social History   Socioeconomic History   Marital status: Single    Spouse name: Not on file   Number of children: Not on file   Years of education: Not on file   Highest education level: Not on file  Occupational History   Not on file  Tobacco Use   Smoking status: Former    Current packs/day: 0.00    Average packs/day: 0.3 packs/day for 10.0 years (2.5 ttl pk-yrs)    Types: Cigarettes    Start date: 02/24/2004    Quit date: 02/23/2014    Years since quitting: 9.1   Smokeless tobacco: Never  Vaping Use   Vaping status: Never Used  Substance and Sexual Activity   Alcohol use: Yes    Alcohol/week: 5.0 standard drinks of alcohol    Types: 5 Glasses of wine per week  Drug use: No   Sexual activity: Not Currently    Birth control/protection: None  Other Topics Concern   Not on file  Social History Narrative   Not on file   Social Drivers of Health   Financial Resource Strain: Not on file  Food Insecurity: Not on file  Transportation Needs: Not on file  Physical Activity: Not on file  Stress: Not on file  Social Connections: Unknown (07/07/2021)   Received from [employer], [facility]   Social Network    Social Network: Not on file   Allergies  Allergen Reactions   Ambien [Zolpidem Tartrate] Itching   Belviq [Lorcaserin] Itching   Bupropion Itching   Cymbalta [Duloxetine Hcl] Other (See Comments)    Headache   Family History  Problem Relation Age of Onset   Hypertension Mother    Hyperlipidemia Mother     Dementia Mother    Heart disease Father    Stroke Father    Heart attack Father    Hypertension Father    Heart murmur Maternal Grandmother    Asthma Paternal Grandmother    Atrial fibrillation Maternal Aunt    Cancer Paternal Aunt        Breast      Current Outpatient Medications (Respiratory):    fluticasone (FLONASE) 50 MCG/ACT nasal spray, Place 2 sprays into both nostrils daily as needed for allergies.   Current Outpatient Medications (Hematological):    Cyanocobalamin (B-12 PO), Take by mouth.  Current Outpatient Medications (Other):    BIOTIN PO, Take by mouth.   Calcium Carb-Cholecalciferol (CALCIUM 500 + D PO), Take 500 mg by mouth 3 (three) times daily.   Multiple Vitamins-Minerals (MULTIVITAMIN WITH MINERALS) tablet, Take 1 tablet by mouth daily.   naltrexone (DEPADE) 50 MG tablet, Take 1 tablet (50 mg total) by mouth daily.   phentermine 37.5 MG capsule, Take 1 capsule (37.5 mg total) by mouth every morning.   Reviewed prior external information including notes and imaging from  primary care provider As well as notes that were available from care everywhere and other healthcare systems.  Past medical history, social, surgical and family history all reviewed in electronic medical record.  No pertanent information unless stated regarding to the chief complaint.   Review of Systems:  No headache, visual changes, nausea, vomiting, diarrhea, constipation, dizziness, abdominal pain, skin rash, fevers, chills, night sweats, weight loss, swollen lymph nodes, body aches, joint swelling, chest pain, shortness of breath, mood changes. POSITIVE muscle aches  Objective  Blood pressure 120/70, pulse 71, height 5\' 2"  (1.575 m), weight 204 lb 12.8 oz (92.9 kg), SpO2 96%.   General: No apparent distress alert and oriented x3 mood and affect normal, dressed appropriately.  HEENT: Pupils equal, extraocular movements intact  Respiratory: Patient's speak in full sentences and  does not appear short of breath  Cardiovascular: No lower extremity edema, non tender, no erythema  Finger exam shows mild hypertrophy noted of the PIP on the ulnar aspect of the index finger.   Limited muscular skeletal ultrasound was performed and interpreted by Antoine Primas, M   Does have lateral plate of subluxation noted that seems to be more on the superior and distal aspects of the joint at the moment. Impression: Subluxation of the lateral plate  Impression and Recommendations:    The above documentation has been reviewed and is accurate and complete Judi Saa, DO

## 2023-04-16 ENCOUNTER — Ambulatory Visit (INDEPENDENT_AMBULATORY_CARE_PROVIDER_SITE_OTHER): Payer: Commercial Managed Care - HMO | Admitting: Family Medicine

## 2023-04-16 ENCOUNTER — Other Ambulatory Visit: Payer: Self-pay

## 2023-04-16 ENCOUNTER — Encounter: Payer: Self-pay | Admitting: Family Medicine

## 2023-04-16 VITALS — BP 120/70 | HR 71 | Ht 62.0 in | Wt 204.8 lb

## 2023-04-16 DIAGNOSIS — M79645 Pain in left finger(s): Secondary | ICD-10-CM | POA: Diagnosis not present

## 2023-04-16 DIAGNOSIS — S63221D Subluxation of unspecified interphalangeal joint of left index finger, subsequent encounter: Secondary | ICD-10-CM

## 2023-04-16 DIAGNOSIS — S63229D Subluxation of unspecified interphalangeal joint of unspecified finger, subsequent encounter: Secondary | ICD-10-CM | POA: Insufficient documentation

## 2023-04-16 NOTE — Assessment & Plan Note (Signed)
Seems to be more of a subluxation of the lateral plate.  Discussed icing regimen of home exercises, discussed which activities to do and which ones to avoid.  Discussed potentially buddy taping for short course of time.  Discussed a short 3-day burst of anti-inflammatories.  Follow-up with me again in 6 to 8 weeks otherwise.

## 2023-04-16 NOTE — Patient Instructions (Addendum)
Nice to meet you. Ibuprofen three times a day for 3 days. Return in 3 to 4 weeks if you need me.

## 2023-04-19 ENCOUNTER — Encounter: Payer: Self-pay | Admitting: Gastroenterology

## 2023-04-22 NOTE — Progress Notes (Signed)
 Chief Complaint:abdominal pain, diarrhea Primary GI Doctor: Dr. Barron Alvine   HPI:  Patient is a 48 year old African American female patient with past medical history of autoimmune hemolytic anemia, vitamin d deficiency,  prediabetes, hyperlipidemia, obesity, anxiety, depression, who presents for a complaint of abdominal pain, diarrhea .    04/09/2023 patient seen by [facility] at [facility] with main complaint of chronic diarrhea for approximately 1 year with increased frequency recently.  Patient states diarrhea occurs 15 to 30 minutes postprandially regardless of food.  No associated abdominal pain, cramps, nausea, vomiting or blood in stool.  GI pathogen panel with fecal fat analysis ordered with lab work.  Referral for GI.  Interval History     Patient presents with main complaint of  intermittent diarrhea for over a year. Initially the episodes were happening once a month and now they occur at least twice a week.  Patient  will have 1-2 episodes a week of urgent diarrhea after eating. She reports it will occur 25-30 minutes after eating no particular food. She did cut out Chick fila because it caused an episode.  She will have lower abdominal cramping that subsides with bowel movement. She also experiences a lot of stomach gurgling. No blood in stool. No other known food triggers. No nocturnal symptoms. No new medications. No travel or exposure.  Occasional pyrosis with eating citrus fruits and melons, less than twice a week.Patient denies dysphagia. Patient denies nausea, vomiting, or weight loss. She does state her stress has increased the last two years. Nonsmoker. She drinks a couple glasses of wine per night. Patients last EGD preoperatively for bariatric surgery. Patient underwent laparoscopic sleeve gastrectomy on March 05, 2020. Last colonoscopy report below. Patient's family history includes breast CA in paternal Aunt.  Wt Readings from Last 3 Encounters:  04/26/23 200 lb (90.7 kg)   04/16/23 204 lb 12.8 oz (92.9 kg)  11/12/22 196 lb 12.8 oz (89.3 kg)    Past Medical History:  Diagnosis Date   Allergy    Seasonal   Anxiety 03/22/2018   Autoimmune hemolytic anemia (HCC) 04/10/2020   Chronic right hip pain 03/05/2020   Controlled substance agreement signed 12/18/2016   For xanax   COVID-19    Depression    previously on lexapro, celexa,    Dyslipidemia    Heart murmur 12/27/2019   Hip pain, chronic    Right   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/09/2020   Insomnia    Mixed hyperlipidemia 12/06/2019   Morbid obesity (HCC) 10/15/2019   Multinodular goiter (nontoxic) 03/16/2019   Small nodules- no need for bx or follow up imaging 2020   Ovarian cyst    left   Prediabetes 03/05/2020   Right knee pain    S/P laparoscopic sleeve gastrectomy 03/05/2020   Severe obesity (BMI >= 40) (HCC) 10/15/2019   Vitamin D deficiency    Weight gain 09/09/2022   Past Surgical History:  Procedure Laterality Date   ADENOIDECTOMY     CESAREAN SECTION  2001   COLONOSCOPY WITH PROPOFOL N/A 02/25/2021   Procedure: COLONOSCOPY WITH PROPOFOL;  Surgeon: Toney Reil, MD;  Location: [facility] ENDOSCOPY;  Service: Endoscopy;  Laterality: N/A;   DILATION AND CURETTAGE OF UTERUS     ESOPHAGOGASTRODUODENOSCOPY     LAPAROSCOPIC GASTRIC SLEEVE RESECTION N/A 03/05/2020   Procedure: LAPAROSCOPIC GASTRIC SLEEVE RESECTION;  Surgeon: Gaynelle Adu, MD;  Location: [facility];  Service: General;  Laterality: N/A;   TONSILLECTOMY     UPPER GI ENDOSCOPY N/A  03/05/2020   Procedure: UPPER GI ENDOSCOPY;  Surgeon: Gaynelle Adu, MD;  Location: [facility];  Service: General;  Laterality: N/A;   Current Outpatient Medications  Medication Sig Dispense Refill   BIOTIN PO Take by mouth.     Calcium Carb-Cholecalciferol (CALCIUM 500 + D PO) Take 500 mg by mouth 3 (three) times daily.     Cyanocobalamin (B-12 PO) Take by mouth.     fluticasone (FLONASE) 50 MCG/ACT nasal spray Place 2 sprays into both nostrils daily as needed  for allergies. 16 g 12   Multiple Vitamins-Minerals (MULTIVITAMIN WITH MINERALS) tablet Take 1 tablet by mouth daily.     No current facility-administered medications for this visit.   Allergies as of 04/26/2023 - Review Complete 04/26/2023  Allergen Reaction Noted   Ambien [zolpidem tartrate] Itching 09/22/2016   Belviq [lorcaserin] Itching 01/28/2018   Bupropion Itching 09/22/2016   Cymbalta [duloxetine hcl] Other (See Comments) 11/17/2016   Family History  Problem Relation Age of Onset   Hypertension Mother    Hyperlipidemia Mother    Dementia Mother    Heart disease Father    Stroke Father    Heart attack Father    Hypertension Father    Heart murmur Maternal Grandmother    Asthma Paternal Grandmother    Atrial fibrillation Maternal Aunt    Cancer Paternal Aunt        Breast   Review of Systems:    Constitutional: No weight loss, fever, chills, weakness or fatigue HEENT: Eyes: No change in vision               Ears, Nose, Throat:  No change in hearing or congestion Skin: No rash or itching Cardiovascular: No chest pain, chest pressure or palpitations   Respiratory: No SOB or cough Gastrointestinal: See HPI and otherwise negative Genitourinary: No dysuria or change in urinary frequency Neurological: No headache, dizziness or syncope Musculoskeletal: No new muscle or joint pain Hematologic: No bleeding or bruising Psychiatric: No history of depression or anxiety   Physical Exam:  Vital signs: BP 120/70   Pulse 74   Ht 5\' 3"  (1.6 m)   Wt 200 lb (90.7 kg)   SpO2 97%   BMI 35.43 kg/m   Constitutional:   Pleasant African American female appears to be in NAD, Well developed, Well nourished, alert and cooperative Throat: Oral cavity and pharynx without inflammation, swelling or lesion.  Respiratory: Respirations even and unlabored. Lungs clear to auscultation bilaterally.   No wheezes, crackles, or rhonchi.  Cardiovascular: Normal S1, S2. Regular rate and rhythm. No  peripheral edema, cyanosis or pallor.  Gastrointestinal:  Soft, nondistended, nontender. No rebound or guarding. Normal bowel sounds. No appreciable masses or hepatomegaly. Rectal:  Not performed.  Msk:  Symmetrical without gross deformities. Without edema, no deformity or joint abnormality.  Neurologic:  Alert and  oriented x4;  grossly normal neurologically.  Skin:   Dry and intact without significant lesions or rashes. Psychiatric: Oriented to person, place and time. Demonstrates good judgement and reason without abnormal affect or behaviors.  RELEVANT LABS AND IMAGING: CBC    Latest Ref Rng & Units 09/09/2022    4:13 PM 04/22/2022   11:46 AM 01/07/2021   12:04 PM  CBC  WBC 3.4 - 10.8 x10E3/uL 5.7  5.8  7.0   Hemoglobin 11.1 - 15.9 g/dL 16.1  09.6  04.5   Hematocrit 34.0 - 46.6 % 39.2  42.8  43.8   Platelets 150 - 450 x10E3/uL 231  237  252     CMP     Latest Ref Rng & Units 04/22/2022   11:46 AM 01/07/2021   12:04 PM 08/29/2020    2:24 PM  CMP  Glucose 70 - 99 mg/dL 85  93  82   BUN 6 - 24 mg/dL 15  17  14    Creatinine 0.57 - 1.00 mg/dL 1.61  0.96  0.45   Sodium 134 - 144 mmol/L 143  143  142   Potassium 3.5 - 5.2 mmol/L 4.8  3.6  3.6   Chloride 96 - 106 mmol/L 102  106  104   CO2 20 - 29 mmol/L 26  26  26    Calcium 8.7 - 10.2 mg/dL 40.9  9.8  9.7   Total Protein 6.0 - 8.5 g/dL 7.2  7.8  7.0   Total Bilirubin 0.0 - 1.2 mg/dL 0.7  1.0  1.0   Alkaline Phos 44 - 121 IU/L 92  98  82   AST 0 - 40 IU/L 19  17  19    ALT 0 - 32 IU/L 14  14  16      Lab Results  Component Value Date   TSH 1.350 09/09/2022  04/20/2023 labs show: BUN 17, creatinine 0.99, normal LFTs Recent stools tests; Samonella, shigellla, campylobacter, e-coli per patient negative  03/18/20 CT ABD/pelvis WO contrast IMPRESSION: 1. Negative for acute intra-abdominal or pelvic abnormality. 2. Postsurgical changes of the stomach consistent with history of gastric sleeve surgery. 3. Borderline  splenomegaly. 02/25/21 colonoscopy with Dr. Allegra Lai, recall 7-10 years Impression:  - One 5 mm polyp in the ascending colon, removed with a cold snare. Resected and retrieved.  - The distal rectum and anal verge are normal on retroflexion view. Path: DIAGNOSIS:  A. COLON POLYP, ASCENDING; COLD SNARE:  - POLYPOID COLONIC MUCOSA WITH INTRAMUCOSAL LYMPHOID AGGREGATES AND  FOCAL ACTIVE INFLAMMATION.  - NEGATIVE FOR DYSPLASIA AND MALIGNANCY.  12/27/19 echo-The baseline ejection fraction was 60%. The peak ejection fraction at stress was 65%.   Assessment: Encounter Diagnoses  Name Primary?   Abdominal cramping, bilateral lower quadrant Yes   Altered bowel habits    Urgency incontinence    [place]gmi      48 year old [place] American female patient with altered bowel habits with stool incontinence over the course of the past year. Stool test were done by [facility] for bacterial causes, normal per patient. Will check lab work to rule out inflammatory disease or celiac disease.  Also check fecal calprotectin, if elevated consider colonoscopy. We discussed following adding fiber to her diet, she has concerns it will cause constipation.I will start her off with small amount as this should help with the consistency of the stools. We can consider pelvic floor therapy in future if fiber does not work. Will also order abdominal ultrasound to rule out gallbladder disease with weight loss from bariatric surgery or other acute cause. TSH normal at 1.35. Instructed her to keep food dairy. UTD on colonoscopy for colon screening purposes, but no random biopsies as she did not have diarrhea at that time.   Plan: -Check CRP, TTG IgA, IgA - Check fecal calprotectin - Order Abd U/S complete -Start Citrucel 1 tsp po daily -Recommend Low FODmap diet  -Keep food dairy  Thank you for the courtesy of this consult. Please call me with any questions or concerns.   Zuhayr Deeney, FNP-C [facility] 04/26/2023, 2:14  PM  Cc: Arnette Felts, FNP '

## 2023-04-26 ENCOUNTER — Ambulatory Visit (INDEPENDENT_AMBULATORY_CARE_PROVIDER_SITE_OTHER): Payer: Commercial Managed Care - HMO | Admitting: Gastroenterology

## 2023-04-26 ENCOUNTER — Other Ambulatory Visit (INDEPENDENT_AMBULATORY_CARE_PROVIDER_SITE_OTHER)

## 2023-04-26 ENCOUNTER — Encounter: Payer: Self-pay | Admitting: Gastroenterology

## 2023-04-26 VITALS — BP 120/70 | HR 74 | Ht 63.0 in | Wt 200.0 lb

## 2023-04-26 DIAGNOSIS — R198 Other specified symptoms and signs involving the digestive system and abdomen: Secondary | ICD-10-CM

## 2023-04-26 DIAGNOSIS — R1031 Right lower quadrant pain: Secondary | ICD-10-CM | POA: Diagnosis not present

## 2023-04-26 DIAGNOSIS — N3941 Urge incontinence: Secondary | ICD-10-CM

## 2023-04-26 DIAGNOSIS — R1032 Left lower quadrant pain: Secondary | ICD-10-CM | POA: Diagnosis not present

## 2023-04-26 DIAGNOSIS — R194 Change in bowel habit: Secondary | ICD-10-CM

## 2023-04-26 LAB — C-REACTIVE PROTEIN: CRP: <1 mg/dL (ref 0.5–20.0)

## 2023-04-26 NOTE — Patient Instructions (Signed)
 Your provider has requested that you go to the basement level for lab work before leaving today. Press "B" on the elevator. The lab is located at the first door on the left as you exit the elevator.  You have been scheduled for an abdominal ultrasound at [facility] (1st floor of hospital) on 04/27/23 at 10:30 am. Please arrive 30 minutes prior to your appointment for registration. Make certain not to have anything to eat or drink 6 hours prior to your appointment. Should you need to reschedule your appointment, please contact radiology at [phone]. This test typically takes about 30 minutes to perform.  Start Citrucel 1 teaspoon po daily to help regulate bowels. If it causes constipation take it every other day. Please keep food dairy and bring to next visit.  Follow up in 3 months  If your blood pressure at your visit was 140/90 or greater, please contact your primary care physician to follow up on this.  _______________________________________________________  If you are age 63 or older, your body mass index should be between 23-30. Your Body mass index is 35.43 kg/m. If this is out of the aforementioned range listed, please consider follow up with your Primary Care Provider.  If you are age 31 or younger, your body mass index should be between 19-25. Your Body mass index is 35.43 kg/m. If this is out of the aformentioned range listed, please consider follow up with your Primary Care Provider.   ________________________________________________________  The [facility] providers would like to encourage you to use [facility] to communicate with providers for non-urgent requests or questions.  Due to long hold times on the telephone, sending your provider a message by [facility] may be a faster and more efficient way to get a response.  Please allow 48 business hours for a response.  Please remember that this is for non-urgent requests.   _______________________________________________________  Due to recent changes in healthcare laws, you may see the results of your imaging and laboratory studies on MyChart before your provider has had a chance to review them.  We understand that in some cases there may be results that are confusing or concerning to you. Not all laboratory results come back in the same time frame and the provider may be waiting for multiple results in order to interpret others.  Please give [place] 48 hours in order for your provider to thoroughly review all the results before contacting the office for clarification of your results.   Thank you for entrusting me with your care and choosing [facility].  Deanna May NP

## 2023-04-27 ENCOUNTER — Ambulatory Visit (HOSPITAL_COMMUNITY): Admission: RE | Admit: 2023-04-27 | Source: Ambulatory Visit

## 2023-04-28 LAB — TISSUE TRANSGLUTAMINASE, IGA: (tTG) Ab, IgA: <1 U/mL

## 2023-04-28 LAB — IGA: Immunoglobulin A: 142 mg/dL (ref 47–310)

## 2023-04-30 NOTE — Progress Notes (Signed)
 Agree with the assessment and plan as outlined by [facility], FNP-C.  Cindy Bottino, DO, [facility]

## 2023-05-06 ENCOUNTER — Other Ambulatory Visit: Payer: Self-pay

## 2023-05-06 ENCOUNTER — Other Ambulatory Visit

## 2023-05-06 DIAGNOSIS — R194 Change in bowel habit: Secondary | ICD-10-CM

## 2023-05-06 DIAGNOSIS — R1031 Right lower quadrant pain: Secondary | ICD-10-CM

## 2023-05-06 DIAGNOSIS — N3941 Urge incontinence: Secondary | ICD-10-CM

## 2023-05-06 DIAGNOSIS — R198 Other specified symptoms and signs involving the digestive system and abdomen: Secondary | ICD-10-CM

## 2023-05-09 LAB — CALPROTECTIN, FECAL: Calprotectin, Fecal: 90 ug/g (ref 0–120)

## 2023-05-11 ENCOUNTER — Encounter: Payer: Self-pay | Admitting: Gastroenterology

## 2023-05-12 NOTE — Progress Notes (Unsigned)
 [facility] [address] [place] [address] Phone: [phone] Subjective:   Cindy Gilbert, am serving as a scribe for Dr. Antoine Primas.  I'm seeing this patient by the request  of:  Arnette Felts, FNP  CC: finger pain follow up   EPP:[id number]  04/16/2023 Seems to be more of a subluxation of the lateral plate.  Discussed icing regimen of home exercises, discussed which activities to do and which ones to avoid.  Discussed potentially buddy taping for short course of time.  Discussed a short 3-day burst of anti-inflammatories.  Follow-up with me again in 6 to 8 weeks otherwise.     Updated 05/13/2023 Cindy Gilbert is a 48 y.o. female coming in with complaint of L finger pain. Patient states that finger subluxed within one week since last visit. Pain has not improved. Pain in PIP and middle phalanx.       Past Medical History:  Diagnosis Date   Allergy    Seasonal   Anxiety 03/22/2018   Autoimmune hemolytic anemia (HCC) 04/10/2020   Chronic right hip pain 03/05/2020   Controlled substance agreement signed 12/18/2016   For xanax   COVID-19    Depression    previously on lexapro, celexa,    Dyslipidemia    Heart murmur 12/27/2019   Hip pain, chronic    Right   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/09/2020   Insomnia    Mixed hyperlipidemia 12/06/2019   Morbid obesity (HCC) 10/15/2019   Multinodular goiter (nontoxic) 03/16/2019   Small nodules- no need for bx or follow up imaging 2020   Ovarian cyst    left   Prediabetes 03/05/2020   Right knee pain    S/P laparoscopic sleeve gastrectomy 03/05/2020   Severe obesity (BMI >= 40) (HCC) 10/15/2019   Vitamin D deficiency    Weight gain 09/09/2022   Past Surgical History:  Procedure Laterality Date   ADENOIDECTOMY     CESAREAN SECTION  2001   COLONOSCOPY WITH PROPOFOL N/A 02/25/2021   Procedure: COLONOSCOPY WITH PROPOFOL;  Surgeon: Toney Reil, MD;  Location: [facility];  Service:  Endoscopy;  Laterality: N/A;   DILATION AND CURETTAGE OF UTERUS     ESOPHAGOGASTRODUODENOSCOPY     LAPAROSCOPIC GASTRIC SLEEVE RESECTION N/A 03/05/2020   Procedure: LAPAROSCOPIC GASTRIC SLEEVE RESECTION;  Surgeon: Gaynelle Adu, MD;  Location: [facility];  Service: General;  Laterality: N/A;   TONSILLECTOMY     UPPER GI ENDOSCOPY N/A 03/05/2020   Procedure: UPPER GI ENDOSCOPY;  Surgeon: Gaynelle Adu, MD;  Location: [facility];  Service: General;  Laterality: N/A;   Social History   Socioeconomic History   Marital status: Single    Spouse name: Not on file   Number of children: Not on file   Years of education: Not on file   Highest education level: Not on file  Occupational History   Not on file  Tobacco Use   Smoking status: Former    Current packs/day: 0.00    Average packs/day: 0.3 packs/day for 10.0 years (2.5 ttl pk-yrs)    Types: Cigarettes    Start date: 02/24/2004    Quit date: 02/23/2014    Years since quitting: 9.2   Smokeless tobacco: Never  Vaping Use   Vaping status: Never Used  Substance and Sexual Activity   Alcohol use: Yes    Alcohol/week: 5.0 standard drinks of alcohol    Types: 5 Glasses of wine per week   Drug use: No  Sexual activity: Not Currently    Birth control/protection: None  Other Topics Concern   Not on file  Social History Narrative   Not on file   Social Drivers of Health   Financial Resource Strain: Not on file  Food Insecurity: Not on file  Transportation Needs: Not on file  Physical Activity: Not on file  Stress: Not on file  Social Connections: Unknown (07/07/2021)   Received from [employer], [facility]   Social Network    Social Network: Not on file   Allergies  Allergen Reactions   Ambien [Zolpidem Tartrate] Itching   Belviq [Lorcaserin] Itching   Bupropion Itching   Cymbalta [Duloxetine Hcl] Other (See Comments)    Headache   Family History  Problem Relation Age of Onset   Hypertension Mother    Hyperlipidemia Mother     Dementia Mother    Heart disease Father    Stroke Father    Heart attack Father    Hypertension Father    Heart murmur Maternal Grandmother    Asthma Paternal Grandmother    Atrial fibrillation Maternal Aunt    Cancer Paternal Aunt        Breast      Current Outpatient Medications (Respiratory):    fluticasone (FLONASE) 50 MCG/ACT nasal spray, Place 2 sprays into both nostrils daily as needed for allergies.   Current Outpatient Medications (Hematological):    Cyanocobalamin (B-12 PO), Take by mouth.  Current Outpatient Medications (Other):    BIOTIN PO, Take by mouth.   Calcium Carb-Cholecalciferol (CALCIUM 500 + D PO), Take 500 mg by mouth 3 (three) times daily.   Multiple Vitamins-Minerals (MULTIVITAMIN WITH MINERALS) tablet, Take 1 tablet by mouth daily.   Reviewed prior external information including notes and imaging from  primary care provider As well as notes that were available from care everywhere and other healthcare systems.  Past medical history, social, surgical and family history all reviewed in electronic medical record.  No pertanent information unless stated regarding to the chief complaint.   Review of Systems:  No headache, visual changes, nausea, vomiting, diarrhea, constipation, dizziness, abdominal pain, skin rash, fevers, chills, night sweats, weight loss, swollen lymph nodes, body aches, joint swelling, chest pain, shortness of breath, mood changes. POSITIVE muscle aches  Objective  Blood pressure 106/70, pulse 76, height 5\' 3"  (1.6 m), SpO2 93%.   General: No apparent distress alert and oriented x3 mood and affect normal, dressed appropriately.  HEENT: Pupils equal, extraocular movements intact  Respiratory: Patient's speak in full sentences and does not appear short of breath  Cardiovascular: No lower extremity edema, non tender, no erythema  Finger exam shows some still angulation noted in between the DIP and the PIP of the index finger noted.   Does have swelling noted over the PIP joint.  Lacks some range of motion.  Limited muscular skeletal ultrasound was performed and interpreted by Antoine Primas, M  Limited ultrasound shows some hypoechoic changes noted.  This seems to be more of a joint effusion noted.  No significant cortical irregularity otherwise noted.  Still seems to have some mild displacement noted of the lateral plate.  Impression: Inflammation of the PIP joint of the index finger  Procedure: Real-time Ultrasound Guided Injection of left second finger PIP Device: GE Logiq Q7 Ultrasound guided injection is preferred based studies that show increased duration, increased effect, greater accuracy, decreased procedural pain, increased response rate, and decreased cost with ultrasound guided versus blind injection.  Verbal informed consent  obtained.  Time-out conducted.  Noted no overlying erythema, induration, or other signs of local infection.  Skin prepped in a sterile fashion.  Local anesthesia: Topical Ethyl chloride.  With sterile technique and under real time ultrasound guidance: With a 25-gauge half inch needle injected with 0.5 cc of 0.5% Marcaine and 0.5 cc of Kenalog 40 mg/mL. Completed without difficulty  Pain immediately resolved suggesting accurate placement of the medication.  Advised to call if fevers/chills, erythema, induration, drainage, or persistent bleeding.  Impression: Technically successful ultrasound guided injection.    Impression and Recommendations:    The above documentation has been reviewed and is accurate and complete Judi Saa, DO

## 2023-05-13 ENCOUNTER — Encounter: Payer: Self-pay | Admitting: Family Medicine

## 2023-05-13 ENCOUNTER — Ambulatory Visit (INDEPENDENT_AMBULATORY_CARE_PROVIDER_SITE_OTHER)

## 2023-05-13 ENCOUNTER — Other Ambulatory Visit: Payer: Self-pay

## 2023-05-13 ENCOUNTER — Ambulatory Visit: Admitting: Family Medicine

## 2023-05-13 VITALS — BP 106/70 | HR 76 | Ht 63.0 in

## 2023-05-13 DIAGNOSIS — M79642 Pain in left hand: Secondary | ICD-10-CM

## 2023-05-13 DIAGNOSIS — S63229D Subluxation of unspecified interphalangeal joint of unspecified finger, subsequent encounter: Secondary | ICD-10-CM

## 2023-05-13 DIAGNOSIS — S63221D Subluxation of unspecified interphalangeal joint of left index finger, subsequent encounter: Secondary | ICD-10-CM | POA: Diagnosis not present

## 2023-05-13 NOTE — Assessment & Plan Note (Addendum)
 Recurrent subluxation noted again.  Felt that some of the inflammation was contributing to this sustained out of place.  Discussed with patient did not due to like to have an injection.  Discussed icing regimen and home exercises, discussed which activities to do this to avoid.  Increase activity slowly otherwise.  Discussed icing regimen.  Follow-up again in 6 to 8 weeks

## 2023-05-13 NOTE — Patient Instructions (Addendum)
 Injected finger See you again in 6 weeks just in case Enjoy [place]

## 2023-05-14 ENCOUNTER — Ambulatory Visit: Admitting: Allergy

## 2023-05-14 ENCOUNTER — Encounter: Payer: Self-pay | Admitting: Allergy

## 2023-05-14 ENCOUNTER — Ambulatory Visit: Payer: Commercial Managed Care - HMO | Admitting: Family Medicine

## 2023-05-14 VITALS — BP 116/70 | HR 87 | Temp 98.1°F | Resp 16 | Ht 63.0 in | Wt 201.2 lb

## 2023-05-14 DIAGNOSIS — K529 Noninfective gastroenteritis and colitis, unspecified: Secondary | ICD-10-CM

## 2023-05-14 DIAGNOSIS — T781XXD Other adverse food reactions, not elsewhere classified, subsequent encounter: Secondary | ICD-10-CM | POA: Diagnosis not present

## 2023-05-14 DIAGNOSIS — J310 Chronic rhinitis: Secondary | ICD-10-CM | POA: Diagnosis not present

## 2023-05-14 DIAGNOSIS — H109 Unspecified conjunctivitis: Secondary | ICD-10-CM

## 2023-05-14 MED ORDER — RYALTRIS 665-25 MCG/ACT NA SUSP: 2.0000 | Freq: Two times a day (BID) | NASAL | 5 refills | Status: DC | PRN

## 2023-05-14 NOTE — Patient Instructions (Addendum)
 Rhinitis with conjunctivitis, presumed allergic - Recommend Xyzal 5mg  daily at this time.  This replaces Allegra at this time.  May benefit from rotating antihistamine every 6 months or so to maintain effectiveness. - Prescribe Ryaltris nasal spray for enhanced nasal symptom control.  This is a combination nasal spray with Mometasone (steroid for congestion control) and Olopatadine (antihistamine for drainage control).  Ryaltris 2 sprays each nostril twice a day as needed for runny or stuffy nose.  - Advise saline rinses before nasal sprays. - With using nasal sprays point tip of bottle toward eye on same side nostril and lean head slightly forward for best technique.   - Schedule environmental allergy skin testing.  Hold antihistamines for 3 days prior to this visit.   Oral Allergy Syndrome - Include bananas and pineapple in food allergy testing. - The oral allergy syndrome (OAS) or pollen-food allergy syndrome (PFAS) is a relatively common form of food allergy, particularly in adults. It typically occurs in people who have pollen allergies when the immune system "sees" proteins on the food that look like proteins on the pollen. This results in the allergy antibody (IgE) binding to the food instead of the pollen. Patients typically report itching and/or mild swelling of the mouth and throat immediately following ingestion of certain uncooked fruits (including nuts) or raw vegetables. Only a very small number of affected individuals experience systemic allergic reactions, such as anaphylaxis which occurs with true food allergies.       Chronic Diarrhea Chronic diarrhea with no weight loss. Lab work and stool samples done. Ultrasound planned. No clear dietary triggers identified. Food allergies considered. - Review food testing sheet for allergy testing in preparation for food allergy testing. - Schedule skin testing as above for food allergies, including bananas and pineapple.   Return for skin  testing and hold antihistamines for 3 days prior Routine follow-up visit in 4-6 months or sooner if needed

## 2023-05-14 NOTE — Progress Notes (Signed)
 New Patient Note  RE: Cindy Gilbert MRN: [id number] DOB: Aug 18, 1975 Date of Office Visit: 05/14/2023  Primary care provider: Arnette Felts, FNP  Chief Complaint: rule out food allergy  History of present illness: Cindy Gilbert is a 48 y.o. female presenting today for evaluation of chronic diarrhea, environmental allergies.  Discussed the use of AI scribe software for clinical note transcription with the patient, who gave verbal consent to proceed.  She has experienced year-round allergy symptoms for the past ten years, including sore throat, itchy eyes, and sneezing. Sinus infections occur approximately once a year, managed with saline rinses rather than antibiotics. She has tried various allergy medications, including Zyrtec, Claritin, and Allegra, finding Allegra D to be the most effective. Flonase and a prescription nasal spray similar to Flonase were effective in the past but seem less effective now. Over-the-counter eye drops are used for symptom relief.  She notes that bananas and pineapples cause oral itching, leading her to avoid these foods.   She reports experiencing chronic diarrhea for about a year, occurring a couple of times a month with urgency. No specific dietary triggers have been identified. No associated weight loss but accompanying symptoms of cramping, bloating, and gas. In between episodes, her stools are normal. She has undergone lab work and stool sample analysis, and an ultrasound is planned as the next step in her evaluation with her gastroenterologist.  They are wanting to rule in/out food allergens as cause of the diarrhea.   No history of asthma since childhood and no history of eczema.    Review of systems: 10pt ROS negative unless noted above in HPI  Past medical history: Past Medical History:  Diagnosis Date   Allergy    Seasonal   Angio-edema    Anxiety 03/22/2018   Asthma    Autoimmune hemolytic anemia (HCC) 04/10/2020   Chronic right  hip pain 03/05/2020   Controlled substance agreement signed 12/18/2016   For xanax   COVID-19    Depression    previously on lexapro, celexa,    Dyslipidemia    Heart murmur 12/27/2019   Hip pain, chronic    Right   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/09/2020   Insomnia    Mixed hyperlipidemia 12/06/2019   Morbid obesity (HCC) 10/15/2019   Multinodular goiter (nontoxic) 03/16/2019   Small nodules- no need for bx or follow up imaging 2020   Ovarian cyst    left   Prediabetes 03/05/2020   Right knee pain    S/P laparoscopic sleeve gastrectomy 03/05/2020   Severe obesity (BMI >= 40) (HCC) 10/15/2019   Urticaria    Vitamin D deficiency    Weight gain 09/09/2022    Past surgical history: Past Surgical History:  Procedure Laterality Date   ADENOIDECTOMY     CESAREAN SECTION  2001   COLONOSCOPY WITH PROPOFOL N/A 02/25/2021   Procedure: COLONOSCOPY WITH PROPOFOL;  Surgeon: Toney Reil, MD;  Location: [facility];  Service: Endoscopy;  Laterality: N/A;   DILATION AND CURETTAGE OF UTERUS     ESOPHAGOGASTRODUODENOSCOPY     LAPAROSCOPIC GASTRIC SLEEVE RESECTION N/A 03/05/2020   Procedure: LAPAROSCOPIC GASTRIC SLEEVE RESECTION;  Surgeon: Gaynelle Adu, MD;  Location: [facility];  Service: General;  Laterality: N/A;   TONSILLECTOMY     TYMPANOSTOMY TUBE PLACEMENT     UPPER GI ENDOSCOPY N/A 03/05/2020   Procedure: UPPER GI ENDOSCOPY;  Surgeon: Gaynelle Adu, MD;  Location: [facility];  Service: General;  Laterality: N/A;    Family history:  Family History  Problem Relation Age of Onset   Hypertension Mother    Hyperlipidemia Mother    Dementia Mother    Urticaria Father    Heart disease Father    Stroke Father    Heart attack Father    Hypertension Father    Atrial fibrillation Maternal Aunt    Cancer Paternal Aunt        Breast   Heart murmur Maternal Grandmother    Asthma Paternal Grandmother    Allergic rhinitis Neg Hx    Eczema Neg Hx     Social history: Lives in a townhome with  carpeting in bedroom with gas heating and central cooling.  Cat in the home.  No concern for water damage, mildew or roaches in the home.  She is a Theatre manager.  Denies a smoking history.    Medication List: Current Outpatient Medications  Medication Sig Dispense Refill   BIOTIN PO Take by mouth.     Calcium Carb-Cholecalciferol (CALCIUM 500 + D PO) Take 500 mg by mouth 3 (three) times daily.     Cyanocobalamin (B-12 PO) Take by mouth.     fluticasone (FLONASE) 50 MCG/ACT nasal spray Place 2 sprays into both nostrils daily as needed for allergies. 16 g 12   Multiple Vitamins-Minerals (MULTIVITAMIN WITH MINERALS) tablet Take 1 tablet by mouth daily.     No current facility-administered medications for this visit.    Known medication allergies: Allergies  Allergen Reactions   Ambien [Zolpidem Tartrate] Itching   Belviq [Lorcaserin] Itching   Bupropion Itching   Cymbalta [Duloxetine Hcl] Other (See Comments)    Headache     Physical examination: Blood pressure 116/70, pulse 87, temperature 98.1 F (36.7 C), temperature source Temporal, resp. rate 16, height 5\' 3"  (1.6 m), weight 201 lb 3.2 oz (91.3 kg), SpO2 98%.  General: Alert, interactive, in no acute distress. HEENT: PERRLA, TMs pearly gray, turbinates mildly edematous without discharge, post-pharynx non erythematous. Neck: Supple without lymphadenopathy. Lungs: Clear to auscultation without wheezing, rhonchi or rales. {no increased work of breathing. CV: Normal S1, S2 without murmurs. Abdomen: Nondistended, nontender. Skin: Warm and dry, without lesions or rashes. Extremities:  No clubbing, cyanosis or edema. Neuro:   Grossly intact.  Diagnositics/Labs: None today  Assessment and plan:   Rhinitis with conjunctivitis, presumed allergic - Recommend Xyzal 5mg  daily at this time.  This replaces Allegra at this time.  May benefit from rotating antihistamine every 6 months or so to maintain effectiveness. -  Prescribe Ryaltris nasal spray for enhanced nasal symptom control.  This is a combination nasal spray with Mometasone (steroid for congestion control) and Olopatadine (antihistamine for drainage control).  Ryaltris 2 sprays each nostril twice a day as needed for runny or stuffy nose.  - Advise saline rinses before nasal sprays. - With using nasal sprays point tip of bottle toward eye on same side nostril and lean head slightly forward for best technique.   - Schedule environmental allergy skin testing.  Hold antihistamines for 3 days prior to this visit.   Oral Allergy Syndrome - Include bananas and pineapple in food allergy testing. - The oral allergy syndrome (OAS) or pollen-food allergy syndrome (PFAS) is a relatively common form of food allergy, particularly in adults. It typically occurs in people who have pollen allergies when the immune system "sees" proteins on the food that look like proteins on the pollen. This results in the allergy antibody (IgE) binding to the food instead of the pollen.  Patients typically report itching and/or mild swelling of the mouth and throat immediately following ingestion of certain uncooked fruits (including nuts) or raw vegetables. Only a very small number of affected individuals experience systemic allergic reactions, such as anaphylaxis which occurs with true food allergies.    Chronic Diarrhea Chronic diarrhea with no weight loss. Lab work and stool samples done. Ultrasound planned. No clear dietary triggers identified. Food allergies considered. - Review food testing sheet for allergy testing in preparation for food allergy testing. - Schedule skin testing as above for food allergies, including bananas and pineapple.   Return for skin testing and hold antihistamines for 3 days prior  (1-55 env and all food 1-72) Routine follow-up visit in 4-6 months or sooner if needed   I appreciate the opportunity to take part in Addylin's care. Please do not hesitate  to contact me with questions.  Sincerely,   Margo Aye, MD Allergy/Immunology [facility]

## 2023-05-25 ENCOUNTER — Ambulatory Visit (HOSPITAL_COMMUNITY)
Admission: RE | Admit: 2023-05-25 | Discharge: 2023-05-25 | Disposition: A | Discharge status: Home / Self Care | Source: Ambulatory Visit | Attending: Gastroenterology | Admitting: Gastroenterology

## 2023-05-25 DIAGNOSIS — R198 Other specified symptoms and signs involving the digestive system and abdomen: Secondary | ICD-10-CM | POA: Diagnosis present

## 2023-05-25 DIAGNOSIS — R1031 Right lower quadrant pain: Secondary | ICD-10-CM | POA: Diagnosis present

## 2023-05-25 DIAGNOSIS — N3941 Urge incontinence: Secondary | ICD-10-CM

## 2023-05-25 DIAGNOSIS — R1032 Left lower quadrant pain: Secondary | ICD-10-CM | POA: Diagnosis present

## 2023-05-25 DIAGNOSIS — R194 Change in bowel habit: Secondary | ICD-10-CM

## 2023-05-26 ENCOUNTER — Ambulatory Visit (INDEPENDENT_AMBULATORY_CARE_PROVIDER_SITE_OTHER): Admitting: Allergy

## 2023-05-26 ENCOUNTER — Encounter: Payer: Self-pay | Admitting: Allergy

## 2023-05-26 DIAGNOSIS — J302 Other seasonal allergic rhinitis: Secondary | ICD-10-CM

## 2023-05-26 DIAGNOSIS — T781XXD Other adverse food reactions, not elsewhere classified, subsequent encounter: Secondary | ICD-10-CM

## 2023-05-26 DIAGNOSIS — H1013 Acute atopic conjunctivitis, bilateral: Secondary | ICD-10-CM

## 2023-05-26 DIAGNOSIS — J3089 Other allergic rhinitis: Secondary | ICD-10-CM

## 2023-05-26 DIAGNOSIS — K529 Noninfective gastroenteritis and colitis, unspecified: Secondary | ICD-10-CM

## 2023-05-26 MED ORDER — EPINEPHRINE 0.3 MG/0.3ML IJ SOAJ: 0.3000 mg | INTRAMUSCULAR | 1 refills | Status: AC | PRN

## 2023-05-26 MED ORDER — RYALTRIS 665-25 MCG/ACT NA SUSP: 2.0000 | Freq: Two times a day (BID) | NASAL | Status: AC

## 2023-05-26 NOTE — Patient Instructions (Addendum)
 Rhinitis with conjunctivitis, allergic - Recommend Xyzal 5mg  daily at this time.  May benefit from rotating antihistamine every 6 months or so to maintain effectiveness. - Use Ryaltris nasal spray for enhanced nasal symptom control.  This is a combination nasal spray with Mometasone (steroid for congestion control) and Olopatadine (antihistamine for drainage control).  Ryaltris 2 sprays each nostril twice a day as needed for runny or stuffy nose.  - Advise saline rinses before nasal sprays. - With using nasal sprays point tip of bottle toward eye on same side nostril and lean head slightly forward for best technique.   - Environmental allergy testing today showed: ragweed, weeds, dust mites, and cockroach - Copy of test results provided.  - Avoidance measures provided. - Consider allergy shots as a means of long-term control if medication management is not effective enough. - Allergy shots "re-train" and "reset" the immune system to ignore environmental allergens and decrease the resulting immune response to those allergens (sneezing, itchy watery eyes, runny nose, nasal congestion, etc).    - Allergy shots improve symptoms in 75-85% of patients.  - We can discuss more at a future appointment if the medications are not working for you.   Oral Allergy Syndrome - Bananas and pineapple skin testing are negative and you have pollen allergy confimring OAS. - The oral allergy syndrome (OAS) or pollen-food allergy syndrome (PFAS) is a relatively common form of food allergy, particularly in adults. It typically occurs in people who have pollen allergies when the immune system "sees" proteins on the food that look like proteins on the pollen. This results in the allergy antibody (IgE) binding to the food instead of the pollen. Patients typically report itching and/or mild swelling of the mouth and throat immediately following ingestion of certain uncooked fruits (including nuts) or raw vegetables. Only a very  small number of affected individuals experience systemic allergic reactions, such as anaphylaxis which occurs with true food allergies.       Chronic Diarrhea - Food allergy testing is positive to lamb, egg, white potato, sweet potato, cherry, cantaloupe, nutmeg, ginger - You can recall symptoms following lamb ingestion thus would avoid this in diet.  - You may be allergic vs sensitized to the other positive foods.  Recommend you pay attention when eating these foods and if you note symptoms after eating then would not eat that food in diet any no longer and continue avoidance.   If you do not have symptoms after eating these foods then you are sensitized only and not allergic and should keep the food in diet.  - we have discussed the following in regards to foods:   Allergy: food allergy is when you have eaten a food, developed an allergic reaction after eating the food and have IgE to the food (positive food testing either by skin testing or blood testing).  Food allergy could lead to life threatening symptoms  Sensitivity: occurs when you have IgE to a food (positive food testing either by skin testing or blood testing) but is a food you eat without any issues.  This is not an allergy and we recommend keeping the food in the diet  Intolerance: this is when you have negative testing by either skin testing or blood testing thus not allergic but the food causes symptoms (like belly pain, bloating, diarrhea etc) with ingestion.  These foods should be avoided to prevent symptoms.   - Have access to self-injectable epinephrine (Epipen or AuviQ) 0.3mg  at all times - Follow  emergency action plan in case of allergic reaction   Routine follow-up visit in 4-6 months or sooner if needed

## 2023-05-26 NOTE — Progress Notes (Signed)
 Follow-up Note  RE: Cindy Gilbert MRN: [id number] DOB: 10/12/1975 Date of Office Visit: 05/26/2023   History of present illness: Cindy Gilbert is a 48 y.o. female presenting today for skin testing visit.  She was last seen in office on 05/14/23 by myself for environmental allergy and chronic diarrhea.  She has held antihistamines for at least 3 days for testing today.  She is in her usual state of health today.  Medication List: Current Outpatient Medications  Medication Sig Dispense Refill   BIOTIN PO Take by mouth.     Calcium Carb-Cholecalciferol (CALCIUM 500 + D PO) Take 500 mg by mouth 3 (three) times daily.     Cyanocobalamin (B-12 PO) Take by mouth.     fluticasone (FLONASE) 50 MCG/ACT nasal spray Place 2 sprays into both nostrils daily as needed for allergies. 16 g 12   Multiple Vitamins-Minerals (MULTIVITAMIN WITH MINERALS) tablet Take 1 tablet by mouth daily.     Olopatadine-Mometasone (RYALTRIS) [id number] MCG/ACT SUSP Place 2 sprays into the nose 2 (two) times daily as needed (runny or stuffy nose). 29 g 5   No current facility-administered medications for this visit.     Known medication allergies: Allergies  Allergen Reactions   Ambien [Zolpidem Tartrate] Itching   Belviq [Lorcaserin] Itching   Bupropion Itching   Cymbalta [Duloxetine Hcl] Other (See Comments)    Headache   Diagnostics/Labs:  Allergy testing:   Airborne Adult Perc - 05/26/23 0952     Time Antigen Placed 4098    Allergen Manufacturer Waynette Buttery    Location Back    Number of Test 55    1. Control-Buffer 50% Glycerol Negative    2. Control-Histamine 2+    3. Bahia Negative    4. French Southern Territories Negative    5. Johnson Negative    6. Kentucky Blue Negative    7. Meadow Fescue Negative    8. Perennial Rye Negative    9. Timothy Negative    10. Ragweed Mix 2+    11. Cocklebur Negative    12. Plantain,  English 2+    13. Baccharis 2+    14. Dog Fennel Negative    15. [place] Thistle 2+    16.  Lamb's Quarters Negative    17. Sheep Sorrell Negative    18. Rough Pigweed Negative    19. Marsh Elder, Rough Negative    20. Mugwort, Common Negative    21. Box, Elder Negative    22. Cedar, red Negative    23. Sweet Gum Negative    24. Pecan Pollen Negative    25. Pine Mix Negative    26. Walnut, Black Pollen Negative    27. Red Mulberry Negative    28. Ash Mix Negative    29. Birch Mix Negative    30. Beech American Negative    31. Cottonwood, Guinea-[place]iss[place] Negative    32. Hickory, White Negative    33. Maple Mix Negative    34. Oak, Guinea-[place] Mix Negative    35. Sycamore Eastern Negative    36. Alternaria Alternata Negative    37. Cladosporium Herbarum Negative    38. Aspergillus Mix Negative    39. Penicillium Mix Negative    40. Bipolaris Sorokiniana (Helminthosporium) Negative    41. Drechslera Spicifera (Curvularia) Negative    42. Mucor Plumbeus Negative    43. Fusarium Moniliforme Negative    44. Aureobasidium Pullulans (pullulara) Negative    45. Rhizopus Oryzae Negative    46. Botrytis Cinera  Negative    47. Epicoccum Nigrum Negative    48. Phoma Betae Negative    49. Dust Mite Mix 4+    50. Cat Hair 10,000 BAU/ml Negative    51.  Dog Epithelia Negative    52. Mixed Feathers Negative    53. Horse Epithelia Negative    54. Cockroach, German 3+    55. Tobacco Leaf Negative             Intradermal - 05/26/23 1100     Time Antigen Placed 1100    Allergen Manufacturer Greer    Location Arm    Number of Test 14    Control Negative    Bahia Negative    French Southern Territories Negative    Johnson Negative    7 Grass Negative    Tree Mix Negative    Mold 1 Negative    Mold 2 Negative    Mold 3 Negative    Mold 4 Negative    Mite Mix Negative    Cat 4+    Dog Negative    Cockroach Negative             Food Adult Perc - 05/26/23 0900     Time Antigen Placed 4696    Allergen Manufacturer Waynette Buttery    Location Back    Number of allergen test 72     Control-buffer 50%  Glycerol Negative    Control-Histamine 2+    1. Peanut Negative    2. Soybean Negative    3. Wheat Negative    4. Sesame Negative    5. Milk, Cow Negative    6. Casein Negative    7. Egg White, Chicken 2+    8. Shellfish Mix Negative    9. Fish Mix Negative    10. Cashew Negative    11. Walnut Food Negative    12. Almond Negative    13. Hazelnut Negative    14. Pecan Food Negative    15. Pistachio Negative    16. [place] Nut Negative    17. Coconut Negative    18. Trout Negative    19. Tuna Negative    20. Salmon Negative    21. Flounder Negative    22. Codfish Negative    23. Shrimp Negative    24. Crab Negative    25. Lobster Negative    26. Oyster Negative    27. Scallops Negative    28. Oat  Negative    29. Rice Negative    30. Barley Negative    31. Rye  Negative    32. Hops Negative    33. [place] Meat Negative    34. Chicken Meat Negative    35. Pork Negative    36. Beef Negative    37. Lamb 2+    38. Tomato Negative    39. White Potato 2+    40. Sweet Potato 2+    41. Pea, Green/English Negative    42. Navy Bean Negative    43. Green Beans Negative    44. Squash Negative    45. Green Pepper Negative    46. Mushrooms Negative    47. Onion Negative    48. Avocado Negative    49. Cabbage Negative    50. Carrots Negative    51. Celery Negative    52. Corn Negative    53. Cucumber Negative    54. Grape (White seedless) Negative    55. Orange  Negative  56. Lemon Negative    57. Banana Negative    58. Apple Negative    59. Peach Negative    60. Strawberry Negative    61. Blueberry Negative    62. Cherry Negative    63. Cantaloupe Negative    64. Watermelon Negative    65. Pineapple Negative    66. Chocolate/Cacao Bean Negative    67. Cinnamon Negative    68. Nutmeg 2+    69. Ginger 2+    70. Garlic Negative    71. Pepper, Black Negative    72. Mustard Negative             Allergy testing results were read and interpreted by provider,  documented by clinical staff.   Assessment and plan: Rhinitis with conjunctivitis, allergic - Recommend Xyzal 5mg  daily at this time.  May benefit from rotating antihistamine every 6 months or so to maintain effectiveness. - Use Ryaltris nasal spray for enhanced nasal symptom control.  This is a combination nasal spray with Mometasone (steroid for congestion control) and Olopatadine (antihistamine for drainage control).  Ryaltris 2 sprays each nostril twice a day as needed for runny or stuffy nose.  - Advise saline rinses before nasal sprays. - With using nasal sprays point tip of bottle toward eye on same side nostril and lean head slightly forward for best technique.   - Environmental allergy testing today showed: ragweed, weeds, dust mites, and cockroach - Copy of test results provided.  - Avoidance measures provided. - Consider allergy shots as a means of long-term control if medication management is not effective enough. - Allergy shots "re-train" and "reset" the immune system to ignore environmental allergens and decrease the resulting immune response to those allergens (sneezing, itchy watery eyes, runny nose, nasal congestion, etc).    - Allergy shots improve symptoms in 75-85% of patients.  - We can discuss more at a future appointment if the medications are not working for you.  Oral Allergy Syndrome - Bananas and pineapple skin testing are negative and you have pollen allergy confimring OAS. - The oral allergy syndrome (OAS) or pollen-food allergy syndrome (PFAS) is a relatively common form of food allergy, particularly in adults. It typically occurs in people who have pollen allergies when the immune system "sees" proteins on the food that look like proteins on the pollen. This results in the allergy antibody (IgE) binding to the food instead of the pollen. Patients typically report itching and/or mild swelling of the mouth and throat immediately following ingestion of certain uncooked  fruits (including nuts) or raw vegetables. Only a very small number of affected individuals experience systemic allergic reactions, such as anaphylaxis which occurs with true food allergies.    Chronic Diarrhea - Food allergy testing is positive to lamb, egg, white potato, sweet potato, cherry, cantaloupe, nutmeg, ginger - You can recall symptoms following lamb ingestion thus would avoid this in diet.  - You may be allergic vs sensitized to the other positive foods.  Recommend you pay attention when eating these foods and if you note symptoms after eating then would not eat that food in diet any no longer and continue avoidance.   If you do not have symptoms after eating these foods then you are sensitized only and not allergic and should keep the food in diet.  - we have discussed the following in regards to foods:   Allergy: food allergy is when you have eaten a food, developed an allergic reaction after eating the  food and have IgE to the food (positive food testing either by skin testing or blood testing).  Food allergy could lead to life threatening symptoms  Sensitivity: occurs when you have IgE to a food (positive food testing either by skin testing or blood testing) but is a food you eat without any issues.  This is not an allergy and we recommend keeping the food in the diet  Intolerance: this is when you have negative testing by either skin testing or blood testing thus not allergic but the food causes symptoms (like belly pain, bloating, diarrhea etc) with ingestion.  These foods should be avoided to prevent symptoms.   - Have access to self-injectable epinephrine (Epipen or AuviQ) 0.3mg  at all times - Follow emergency action plan in case of allergic reaction   Routine follow-up visit in 4-6 months or sooner if needed  I appreciate the opportunity to take part in Alyce's care. Please do not hesitate to contact me with questions.  Sincerely,   Margo Aye,  MD Allergy/Immunology [facility]

## 2023-05-27 ENCOUNTER — Other Ambulatory Visit: Payer: Self-pay

## 2023-05-27 ENCOUNTER — Telehealth: Payer: Self-pay

## 2023-05-27 MED ORDER — OLOPATADINE HCL 0.6 % NA SOLN: 2.0000 | Freq: Two times a day (BID) | NASAL | 5 refills | Status: DC | PRN

## 2023-05-27 MED ORDER — MOMETASONE FUROATE 50 MCG/ACT NA SUSP: 2.0000 | Freq: Every day | NASAL | 5 refills | Status: AC

## 2023-05-27 NOTE — Telephone Encounter (Signed)
*  Asthma/Allergy  Pharmacy Patient Advocate Encounter   Received notification from CoverMyMeds that prior authorization for Ryaltris 665-25MCG/ACT suspension  is required/requested.   Insurance verification completed.   The patient is insured through [employer] .   Per test claim:  single-agent olopatadine nasal spray AND mometasone nasal spray concurrently  is preferred by the insurance.  If suggested medication is appropriate, Please send in a new RX and discontinue this one. If not, please advise as to why it's not appropriate so that we may request a Prior Authorization. Please note, some preferred medications may still require a PA.  If the suggested medications have not been trialed and there are no contraindications to their use, the PA will not be submitted, as it will not be approved.   CMM Key: [id number]

## 2023-06-10 LAB — HM MAMMOGRAPHY

## 2023-06-24 NOTE — Progress Notes (Signed)
 [facility] [address] [place] [address] Phone: [phone] Subjective:   Cindy Gilbert, am serving as a scribe for Dr. Ronnell Gilbert.  I'm seeing this patient by the request  of:  Cindy Epley, FNP  CC: Finger pain follow-up  HQI:[id number]  05/13/2023 Recurrent subluxation noted again.  Felt that some of the inflammation was contributing to this sustained out of place.  Discussed with patient did not due to like to have an injection.  Discussed icing regimen and home exercises, discussed which activities to do this to avoid.  Increase activity slowly otherwise.  Discussed icing regimen.  Follow-up again in 6 to 8 weeks     Updated 06/25/2023 Cindy Gilbert is a 48 y.o. female coming in with complaint of finger pain. Patient Gilbert that she has less inflammation and more of a tightness at the DIP of pointer finger. No pain.        Past Medical History:  Diagnosis Date   Allergy     Seasonal   Angio-edema    Anxiety 03/22/2018   Asthma    Autoimmune hemolytic anemia (HCC) 04/10/2020   Chronic right hip pain 03/05/2020   Controlled substance agreement signed 12/18/2016   For xanax    COVID-19    Depression    previously on lexapro, celexa,    Dyslipidemia    Heart murmur 12/27/2019   Hip pain, chronic    Right   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/09/2020   Insomnia    Mixed hyperlipidemia 12/06/2019   Morbid obesity (HCC) 10/15/2019   Multinodular goiter (nontoxic) 03/16/2019   Small nodules- no need for bx or follow up imaging 2020   Ovarian cyst    left   Prediabetes 03/05/2020   Right knee pain    S/P laparoscopic sleeve gastrectomy 03/05/2020   Severe obesity (BMI >= 40) (HCC) 10/15/2019   Urticaria    Vitamin D  deficiency    Weight gain 09/09/2022   Past Surgical History:  Procedure Laterality Date   ADENOIDECTOMY     CESAREAN SECTION  2001   COLONOSCOPY WITH PROPOFOL  N/A 02/25/2021   Procedure: COLONOSCOPY WITH PROPOFOL ;   Surgeon: Cindy Daily, MD;  Location: [facility];  Service: Endoscopy;  Laterality: N/A;   DILATION AND CURETTAGE OF UTERUS     ESOPHAGOGASTRODUODENOSCOPY     LAPAROSCOPIC GASTRIC SLEEVE RESECTION N/A 03/05/2020   Procedure: LAPAROSCOPIC GASTRIC SLEEVE RESECTION;  Surgeon: Cindy Hummingbird, MD;  Location: [facility];  Service: General;  Laterality: N/A;   TONSILLECTOMY     TYMPANOSTOMY TUBE PLACEMENT     UPPER GI ENDOSCOPY N/A 03/05/2020   Procedure: UPPER GI ENDOSCOPY;  Surgeon: Cindy Hummingbird, MD;  Location: [facility];  Service: General;  Laterality: N/A;   Social History   Socioeconomic History   Marital status: Single    Spouse name: Not on file   Number of children: Not on file   Years of education: Not on file   Highest education level: Not on file  Occupational History   Not on file  Tobacco Use   Smoking status: Former    Current packs/day: 0.00    Average packs/day: 0.3 packs/day for 10.0 years (2.5 ttl pk-yrs)    Types: Cigarettes    Start date: 02/24/2004    Quit date: 02/23/2014    Years since quitting: 9.3   Smokeless tobacco: Never  Vaping Use   Vaping status: Never Used  Substance and Sexual Activity   Alcohol use: Yes  Alcohol/week: 5.0 standard drinks of alcohol    Types: 5 Glasses of wine per week   Drug use: No   Sexual activity: Not Currently    Birth control/protection: None  Other Topics Concern   Not on file  Social History Narrative   Not on file   Social Drivers of Health   Financial Resource Strain: Not on file  Food Insecurity: Not on file  Transportation Needs: Not on file  Physical Activity: Not on file  Stress: Not on file  Social Connections: Unknown (07/07/2021)   Received from [employer], [facility]   Social Network    Social Network: Not on file   Allergies  Allergen Reactions   Ambien [Zolpidem Tartrate] Itching   Belviq [Lorcaserin ] Itching   Bupropion Itching   Cymbalta [Duloxetine Hcl] Other (See Comments)    Headache    Family History  Problem Relation Age of Onset   Hypertension Mother    Hyperlipidemia Mother    Dementia Mother    Urticaria Father    Heart disease Father    Stroke Father    Heart attack Father    Hypertension Father    Atrial fibrillation Maternal Aunt    Cancer Paternal Aunt        Breast   Heart murmur Maternal Grandmother    Asthma Paternal Grandmother    Allergic rhinitis Neg Hx    Eczema Neg Hx      Current Outpatient Medications (Cardiovascular):    EPINEPHrine  (EPIPEN  2-PAK) 0.3 mg/0.3 mL IJ SOAJ injection, Inject 0.3 mg into the muscle as needed for anaphylaxis.  Current Outpatient Medications (Respiratory):    fluticasone  (FLONASE ) 50 MCG/ACT nasal spray, Place 2 sprays into both nostrils Gilbert as needed for allergies.   mometasone  (NASONEX ) 50 MCG/ACT nasal spray, Place 2 sprays into the nose Gilbert.   Olopatadine  HCl 0.6 % SOLN, Place 2 sprays into the nose 2 (two) times Gilbert as needed.   Olopatadine -Mometasone  (RYALTRIS ) 665-25 MCG/ACT SUSP, Place 2 sprays into the nose 2 (two) times Gilbert as needed (runny or stuffy nose).   Olopatadine -Mometasone  (RYALTRIS ) 665-25 MCG/ACT SUSP, Place 2 sprays into the nose 2 (two) times Gilbert.   Current Outpatient Medications (Hematological):    Cyanocobalamin (B-12 PO), Take by mouth.  Current Outpatient Medications (Other):    BIOTIN PO, Take by mouth.   Calcium Carb-Cholecalciferol (CALCIUM 500 + D PO), Take 500 mg by mouth 3 (three) times Gilbert.   Multiple Vitamins-Minerals (MULTIVITAMIN WITH MINERALS) tablet, Take 1 tablet by mouth Gilbert.   Reviewed prior external information including notes and imaging from  primary care provider As well as notes that were available from care everywhere and other healthcare systems.  Past medical history, social, surgical and family history all reviewed in electronic medical record.  No pertanent information unless stated regarding to the chief complaint.   Review of Systems:   No headache, visual changes, nausea, vomiting, diarrhea, constipation, dizziness, abdominal pain, skin rash, fevers, chills, night sweats, weight loss, swollen lymph nodes, body aches, joint swelling, chest pain, shortness of breath, mood changes. POSITIVE muscle aches  Objective  Blood pressure 114/86, height 5\' 3"  (1.6 m).   General: No apparent distress alert and oriented x3 mood and affect normal, dressed appropriately.  HEENT: Pupils equal, extraocular movements intact  Respiratory: Patient's speak in full sentences and does not appear short of breath  Cardiovascular: No lower extremity edema, non tender, no erythema  Finger exam shows patient's left index finger has near  full flexion and does have full extension at the moment.  There is some mild hypopigmentation noted between the index and middle finger from the injection site.  Overall the very mild.  Patient neurovascularly intact.  Nontender on exam today.    Limited muscular skeletal ultrasound was performed and interpreted by Cindy Gilbert, M  Ultrasound shows there is no significant swelling of the PIP joint that is noted previously.    Impression and Recommendations:     The above documentation has been reviewed and is accurate and complete Sorin Frimpong M Rylen Swindler, DO

## 2023-06-25 ENCOUNTER — Other Ambulatory Visit: Payer: Self-pay

## 2023-06-25 ENCOUNTER — Encounter: Payer: Self-pay | Admitting: Family Medicine

## 2023-06-25 ENCOUNTER — Ambulatory Visit (INDEPENDENT_AMBULATORY_CARE_PROVIDER_SITE_OTHER): Admitting: Family Medicine

## 2023-06-25 VITALS — BP 114/86 | Ht 63.0 in

## 2023-06-25 DIAGNOSIS — S63229D Subluxation of unspecified interphalangeal joint of unspecified finger, subsequent encounter: Secondary | ICD-10-CM | POA: Diagnosis not present

## 2023-06-25 DIAGNOSIS — M79642 Pain in left hand: Secondary | ICD-10-CM

## 2023-06-25 NOTE — Assessment & Plan Note (Signed)
 Significant improvement at this time.  Near full range of motion.  Some mild hypopigmentation from the injection we will monitor.  Follow-up again with me as needed

## 2023-07-08 ENCOUNTER — Encounter: Payer: Self-pay | Admitting: Allergy

## 2023-07-08 ENCOUNTER — Telehealth: Payer: Self-pay

## 2023-07-08 NOTE — Telephone Encounter (Signed)
 PA request has been Submitted. New Encounter has been or will be created for follow up. For additional info see Pharmacy Prior Auth telephone encounter from 05/15.

## 2023-07-08 NOTE — Telephone Encounter (Signed)
*  Asthma/Allergy   Pharmacy Patient Advocate Encounter   Received notification from Pt Calls Messages that prior authorization for Ryaltris  665-25MCG/ACT suspension  is required/requested.   Insurance verification completed.   The patient is insured through [employer] .   Per test claim: PA required; PA submitted to above mentioned insurance via CoverMyMeds Key/confirmation #/EOC [id number] Status is pending

## 2023-07-09 NOTE — Telephone Encounter (Signed)
 Pharmacy Patient Advocate Encounter  Received notification from [employer] that Prior Authorization for Ryaltris  has been DENIED.  Full denial letter will be uploaded to the media tab. See denial reason below.   Based on the information provided, I am unable to approve coverage for this medication because: ? There is no indication that your patient has had an inadequate response or is intolerant to  generic azelastine/fluticasone  nasal spray (may require prior authorization). ? Ryaltris  is considered medically necessary when there is documentation of both of the  following (A and B): A. The individual is unable to use single-agent olopatadine  nasal spray  and mometasone  nasal spray concurrently; and B. The individual has had an inadequate  response or is intolerant to generic azelastine/fluticasone  nasal spray (may require prior authorization).

## 2023-07-14 ENCOUNTER — Telehealth: Payer: Self-pay | Admitting: Pharmacist

## 2023-07-14 NOTE — Telephone Encounter (Signed)
 Information has been sent to clinical pharmacist for appeals review. It may take 5-7 days to prepare the necessary documentation to request the appeal from the insurance.

## 2023-07-14 NOTE — Telephone Encounter (Signed)
 Appeal has been submitted for Ryaltris . Will advise when response is received, please be advised that most companies may take 30 days to make a decision. Appeal letter and supporting documentation have been faxed to [phone] on 07/14/2023 @2 :42 pm.  Thank you, Dene Fines, PharmD Clinical Pharmacist  [place]  Direct Dial: [phone]

## 2023-07-20 ENCOUNTER — Other Ambulatory Visit (HOSPITAL_COMMUNITY): Payer: Self-pay

## 2023-07-29 ENCOUNTER — Other Ambulatory Visit (HOSPITAL_COMMUNITY): Payer: Self-pay

## 2023-08-09 ENCOUNTER — Other Ambulatory Visit (HOSPITAL_COMMUNITY): Payer: Self-pay

## 2023-08-12 NOTE — Telephone Encounter (Signed)
 Spoke with insurance, [employer] has 90 days from the date the appeal was received to make a determination.  This was received on 07/14/2023 and is still opened.  The reference number is [id number]

## 2023-08-23 ENCOUNTER — Other Ambulatory Visit (INDEPENDENT_AMBULATORY_CARE_PROVIDER_SITE_OTHER)

## 2023-08-23 ENCOUNTER — Encounter: Payer: Self-pay | Admitting: Gastroenterology

## 2023-08-23 ENCOUNTER — Ambulatory Visit (INDEPENDENT_AMBULATORY_CARE_PROVIDER_SITE_OTHER): Admitting: Gastroenterology

## 2023-08-23 VITALS — BP 118/88 | HR 87 | Ht 62.0 in | Wt 185.0 lb

## 2023-08-23 DIAGNOSIS — R1031 Right lower quadrant pain: Secondary | ICD-10-CM

## 2023-08-23 DIAGNOSIS — R1030 Lower abdominal pain, unspecified: Secondary | ICD-10-CM

## 2023-08-23 DIAGNOSIS — R194 Change in bowel habit: Secondary | ICD-10-CM

## 2023-08-23 DIAGNOSIS — K760 Fatty (change of) liver, not elsewhere classified: Secondary | ICD-10-CM | POA: Diagnosis not present

## 2023-08-23 DIAGNOSIS — R1032 Left lower quadrant pain: Secondary | ICD-10-CM

## 2023-08-23 LAB — IBC + FERRITIN
Ferritin: 123.3 ng/mL (ref 10.0–291.0)
Iron: 125 ug/dL (ref 42–145)
Saturation Ratios: 43.8 % (ref 20.0–50.0)
TIBC: 285.6 ug/dL (ref 250.0–450.0)
Transferrin: 204 mg/dL — ABNORMAL LOW (ref 212.0–360.0)

## 2023-08-23 LAB — PROTIME-INR
INR: 1.1 ratio — ABNORMAL HIGH (ref 0.8–1.0)
Prothrombin Time: 12 s (ref 9.6–13.1)

## 2023-08-23 MED ORDER — NA SULFATE-K SULFATE-MG SULF 17.5-3.13-1.6 GM/177ML PO SOLN: 1.0000 | Freq: Once | ORAL | 0 refills | Status: AC

## 2023-08-23 NOTE — Patient Instructions (Addendum)
 Small intestinal bacterial overgrowth - Breath test  Check the link below for more information [id number]  If positive test we treat with medication called Xifaxan.   Fatty liver  Recommend Mediterranean diet Maintain healthy weight Physical exercise as tolerated We will do lab work today to rule out hepatitis, genetic disorders, or auto immune liver disease.  We have sent the following medications to your pharmacy for you to pick up at your convenience: SUPREP  Your provider has requested that you go to the basement level for lab work before leaving today. Press B on the elevator. The lab is located at the first door on the left as you exit the elevator.  You have been given a testing kit to check for small intestine bacterial overgrowth (SIBO) which is completed by a company named [facility]. Make sure to return your test in the mail using the return mailing label given to you along with the kit. The test order, your demographic and insurance information have all already been sent to the company. [facility] will collect an upfront charge of $109.00 for commercial insurance plans and $229.00 if you are paying cash. The potential remaining total after claim submission and review is $120.00. Make sure to discuss with [facility] PRIOR to having the test to see if they have gotten information from your insurance company as to how much your testing will cost out of pocket, if any. Please contact [facility] at phone number [phone] to get instructions regarding how to perform the test as our office is unable to give specific testing instructions.   You have been scheduled for a colonoscopy. Please follow written instructions given to you at your visit today.   If you use inhalers (even only as needed), please bring them with you on the day of your procedure.  DO NOT TAKE 7 DAYS PRIOR TO TEST- Trulicity (dulaglutide) Ozempic, Wegovy  (semaglutide) Mounjaro (tirzepatide) Bydureon Bcise (exanatide extended release)  DO NOT TAKE 1 DAY PRIOR TO YOUR TEST Rybelsus (semaglutide) Adlyxin (lixisenatide) Victoza  (liraglutide ) Byetta (exanatide) ___________________________________________________________________________  Due to recent changes in healthcare laws, you may see the results of your imaging and laboratory studies on MyChart before your provider has had a chance to review them.  We understand that in some cases there may be results that are confusing or concerning to you. Not all laboratory results come back in the same time frame and the provider may be waiting for multiple results in order to interpret others.  Please give us  48 hours in order for your provider to thoroughly review all the results before contacting the office for clarification of your results.   _______________________________________________________  If your blood pressure at your visit was 140/90 or greater, please contact your primary care physician to follow up on this.  _______________________________________________________  If you are age 23 or older, your body mass index should be between 23-30. Your Body mass index is 33.84 kg/m. If this is out of the aforementioned range listed, please consider follow up with your Primary Care Provider.  If you are age 39 or younger, your body mass index should be between 19-25. Your Body mass index is 33.84 kg/m. If this is out of the aformentioned range listed, please consider follow up with your Primary Care Provider.   ________________________________________________________  The [facility] providers would like to encourage you to use MYCHART to communicate with providers for non-urgent requests or questions.  Due to long hold times on the telephone, sending your provider a message by  MYCHART may be a faster and more efficient way to get a response.  Please allow 48 business hours for a response.   Please remember that this is for non-urgent requests.  _______________________________________________________  Thank you for trusting me with your gastrointestinal care. Deanna May, RNP

## 2023-08-23 NOTE — Progress Notes (Signed)
 Chief Complaint: follow up on abdominal pain, diarrhea Primary GI Doctor: Dr. San   HPI:  Patient is a 48 year old African American female patient with past medical history of autoimmune hemolytic anemia, vitamin d  deficiency,  prediabetes, hyperlipidemia, obesity, anxiety, and depression, who presents for follow-up on  abdominal pain, diarrhea .    04/09/2023 patient seen by [facility] at [facility] with main complaint of chronic diarrhea for approximately 1 year with increased frequency recently.  Patient states diarrhea occurs 15 to 30 minutes postprandially regardless of food.  No associated abdominal pain, cramps, nausea, vomiting or blood in stool.  GI pathogen panel with fecal fat analysis ordered with lab work.  Referral for GI.  Interval History     Patient presents for follow-up with her symptoms of diarrhea with abdominal cramping. Her symptoms have not improved since our last follow-up and recommendations given. Patient  will have 1-2 episodes a week of urgent diarrhea after eating. She reports it will occur 25-30 minutes after eating no particular food. She will have lower abdominal cramping that subsides with bowel movement. She also experiences a lot of stomach gurgling.   Patient states as of recent her diarrhea has been more frequent. She does note some increased stress.   We reviewed the labs, stool results, and abdominal ultrasound. She enquired more about the fatty liver disease. She reports eating a healthy balanced diet and works out several days a week. She would like to pursue further workup.    Wt Readings from Last 3 Encounters:  08/23/23 185 lb (83.9 kg)  05/14/23 201 lb 3.2 oz (91.3 kg)  04/26/23 200 lb (90.7 kg)    Past Medical History:  Diagnosis Date   Allergy     Seasonal   Angio-edema    Anxiety 03/22/2018   Asthma    Autoimmune hemolytic anemia (HCC) 04/10/2020   Chronic right hip pain 03/05/2020   Controlled substance agreement signed 12/18/2016    For xanax    COVID-19    Depression    previously on lexapro, celexa,    Dyslipidemia    Heart murmur 12/27/2019   Hip pain, chronic    Right   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/09/2020   Insomnia    Mixed hyperlipidemia 12/06/2019   Morbid obesity (HCC) 10/15/2019   Multinodular goiter (nontoxic) 03/16/2019   Small nodules- no need for bx or follow up imaging 2020   Ovarian cyst    left   Prediabetes 03/05/2020   Right knee pain    S/P laparoscopic sleeve gastrectomy 03/05/2020   Severe obesity (BMI >= 40) (HCC) 10/15/2019   Urticaria    Vitamin D  deficiency    Weight gain 09/09/2022   Past Surgical History:  Procedure Laterality Date   ADENOIDECTOMY     CESAREAN SECTION  2001   COLONOSCOPY WITH PROPOFOL  N/A 02/25/2021   Procedure: COLONOSCOPY WITH PROPOFOL ;  Surgeon: Unk Corinn Skiff, MD;  Location: [facility];  Service: Endoscopy;  Laterality: N/A;   DILATION AND CURETTAGE OF UTERUS     ESOPHAGOGASTRODUODENOSCOPY     LAPAROSCOPIC GASTRIC SLEEVE RESECTION N/A 03/05/2020   Procedure: LAPAROSCOPIC GASTRIC SLEEVE RESECTION;  Surgeon: Tanda Locus, MD;  Location: [facility];  Service: General;  Laterality: N/A;   TONSILLECTOMY     TYMPANOSTOMY TUBE PLACEMENT     UPPER GI ENDOSCOPY N/A 03/05/2020   Procedure: UPPER GI ENDOSCOPY;  Surgeon: Tanda Locus, MD;  Location: [facility];  Service: General;  Laterality: N/A;   Current Outpatient Medications  Medication Sig Dispense Refill   BIOTIN PO Take by mouth.     Calcium Carb-Cholecalciferol (CALCIUM 500 + D PO) Take 500 mg by mouth 3 (three) times daily.     Cyanocobalamin (B-12 PO) Take by mouth.     EPINEPHrine  (EPIPEN  2-PAK) 0.3 mg/0.3 mL IJ SOAJ injection Inject 0.3 mg into the muscle as needed for anaphylaxis. 2 each 1   fluticasone  (FLONASE ) 50 MCG/ACT nasal spray Place 2 sprays into both nostrils daily as needed for allergies. 16 g 12   mometasone  (NASONEX ) 50 MCG/ACT nasal spray Place 2 sprays into the nose daily. 17 g 5   Multiple  Vitamins-Minerals (MULTIVITAMIN WITH MINERALS) tablet Take 1 tablet by mouth daily.     Olopatadine  HCl 0.6 % SOLN Place 2 sprays into the nose 2 (two) times daily as needed. 30.5 g 5   Olopatadine -Mometasone  (RYALTRIS ) 665-25 MCG/ACT SUSP Place 2 sprays into the nose 2 (two) times daily as needed (runny or stuffy nose). 29 g 5   Olopatadine -Mometasone  (RYALTRIS ) 665-25 MCG/ACT SUSP Place 2 sprays into the nose 2 (two) times daily.     No current facility-administered medications for this visit.   Allergies as of 08/23/2023 - Review Complete 08/23/2023  Allergen Reaction Noted   Ambien [zolpidem tartrate] Itching 09/22/2016   Belviq [lorcaserin ] Itching 01/28/2018   Bupropion Itching 09/22/2016   Cymbalta [duloxetine hcl] Other (See Comments) 11/17/2016   Family History  Problem Relation Age of Onset   Hypertension Mother    Hyperlipidemia Mother    Dementia Mother    Urticaria Father    Heart disease Father    Stroke Father    Heart attack Father    Hypertension Father    Atrial fibrillation Maternal Aunt    Cancer Paternal Aunt        Breast   Heart murmur Maternal Grandmother    Asthma Paternal Grandmother    Allergic rhinitis Neg Hx    Eczema Neg Hx    Review of Systems:    Constitutional: No weight loss, fever, chills, weakness or fatigue HEENT: Eyes: No change in vision               Ears, Nose, Throat:  No change in hearing or congestion Skin: No rash or itching Cardiovascular: No chest pain, chest pressure or palpitations   Respiratory: No SOB or cough Gastrointestinal: See HPI and otherwise negative Genitourinary: No dysuria or change in urinary frequency Neurological: No headache, dizziness or syncope Musculoskeletal: No new muscle or joint pain Hematologic: No bleeding or bruising Psychiatric: No history of depression or anxiety   Physical Exam:  Vital signs: BP 118/88   Pulse 87   Ht 5' 2 (1.575 m)   Wt 185 lb (83.9 kg)   LMP  (LMP Unknown)   BMI  33.84 kg/m   Constitutional:   Pleasant African American female appears to be in NAD, Well developed, Well nourished, alert and cooperative. Throat: Oral cavity and pharynx without inflammation, swelling or lesion.  Respiratory: Respirations even and unlabored. Lungs clear to auscultation bilaterally.   Cardiovascular:  Regular rate and rhythm. No peripheral edema, cyanosis or pallor.  Gastrointestinal:  Soft, nondistended, nontender. No rebound or guarding. Normal bowel sounds. No appreciable masses or hepatomegaly. Rectal:  Not performed.  Msk:  Symmetrical without gross deformities. Without edema, no deformity or joint abnormality.  Skin:   Dry and intact without significant lesions or rashes.  RELEVANT LABS AND IMAGING: CBC    Latest Ref Rng &  Units 09/09/2022    4:13 PM 04/22/2022   11:46 AM 01/07/2021   12:04 PM  CBC  WBC 3.4 - 10.8 x10E3/uL 5.7  5.8  7.0   Hemoglobin 11.1 - 15.9 g/dL 86.9  85.9  85.1   Hematocrit 34.0 - 46.6 % 39.2  42.8  43.8   Platelets 150 - 450 x10E3/uL 231  237  252     CMP     Latest Ref Rng & Units 04/22/2022   11:46 AM 01/07/2021   12:04 PM 08/29/2020    2:24 PM  CMP  Glucose 70 - 99 mg/dL 85  93  82   BUN 6 - 24 mg/dL 15  17  14    Creatinine 0.57 - 1.00 mg/dL 9.10  9.05  9.17   Sodium 134 - 144 mmol/L 143  143  142   Potassium 3.5 - 5.2 mmol/L 4.8  3.6  3.6   Chloride 96 - 106 mmol/L 102  106  104   CO2 20 - 29 mmol/L 26  26  26    Calcium 8.7 - 10.2 mg/dL 89.9  9.8  9.7   Total Protein 6.0 - 8.5 g/dL 7.2  7.8  7.0   Total Bilirubin 0.0 - 1.2 mg/dL 0.7  1.0  1.0   Alkaline Phos 44 - 121 IU/L 92  98  82   AST 0 - 40 IU/L 19  17  19    ALT 0 - 32 IU/L 14  14  16      Lab Results  Component Value Date   TSH 1.350 09/09/2022  04/20/2023 labs show: BUN 17, creatinine 0.99, normal LFTs Recent stools tests; Samonella, shigellla, campylobacter, e-coli per patient negative 05/06/23 labs show: CRP <1, TTG IgA, IgA- negative, fecal  calprotectin-90  Imaging: 05/25/23 US  Abd complete IMPRESSION: 1. No acute abnormality identified. 2. 5 mm gallbladder polyp. No follow-up imaging recommended. 3. Fatty infiltration of the liver.  03/18/20 CT ABD/pelvis WO contrast IMPRESSION: 1. Negative for acute intra-abdominal or pelvic abnormality. 2. Postsurgical changes of the stomach consistent with history of gastric sleeve surgery. 3. Borderline splenomegaly. 02/25/21 colonoscopy with Dr. Unk, recall 7-10 years Impression:  - One 5 mm polyp in the ascending colon, removed with a cold snare. Resected and retrieved.  - The distal rectum and anal verge are normal on retroflexion view. Path: DIAGNOSIS:  A. COLON POLYP, ASCENDING; COLD SNARE:  - POLYPOID COLONIC MUCOSA WITH INTRAMUCOSAL LYMPHOID AGGREGATES AND FOCAL ACTIVE INFLAMMATION.  - NEGATIVE FOR DYSPLASIA AND MALIGNANCY.  12/27/19 echo-The baseline ejection fraction was 60%. The peak ejection fraction at stress was 65%.   Assessment: Encounter Diagnoses  Name Primary?   Fatty liver Yes   Abdominal cramping, bilateral lower quadrant    Altered bowel habits      48 year old [place] American female patient with diarrhea and abdominal cramping over the past year that has progressively increased in severity and frequency. She has completed stool tests that were negative. Celiac test negative. Fecal calprotectin 90. Last colonoscopy 1/23 for surveillance screening, no random biopsies done. Abd u/s negative with incidental finding of fatty liver. TSH normal. We discussed doing a breath test to rule out small intestinal bacterial overgrowth with her history of bariatric surgery. We also discussed colonoscopy with random biopsy to r/o microscopic colitis.    For the fatty liver she would like to pursue further workup. Will order lab work to r/o hepatitis, autoimmune, or genetic disorders. Encouraged Mediterranean diet, physical exercise as tolerated, and maintaining  healthy weight.    Plan: - Check Sibo breath test  - Check ANA, AMA, Anti-smooth muscle antibody, Hepatitis A IgG, Hepatitis B surface antigen, Hepatitis B surface antibody,  HCV antibody, ferritin, TIBC,  Alpha 1 antitrypsin, ceruloplasmin, PT/INR, IgG - Schedule for a colonoscopy with random biopsies in [facility] with Dr. San. The risks and benefits of colonoscopy with possible polypectomy / biopsies were discussed and the patient agrees to proceed.    Thank you for the courtesy of this consult. Please call me with any questions or concerns.   Temple Ewart, FNP-C [facility] 08/23/2023, 11:34 AM  Cc: Georgina Speaks, FNP '

## 2023-08-27 LAB — ANTI-NUCLEAR AB-TITER (ANA TITER)
ANA TITER: 1:80 {titer} — ABNORMAL HIGH
ANA Titer 1: 1:40 {titer} — ABNORMAL HIGH

## 2023-08-27 LAB — IGG: IgG (Immunoglobin G), Serum: 1204 mg/dL (ref 600–1640)

## 2023-08-27 LAB — HEPATITIS B SURFACE ANTIBODY,QUALITATIVE: Hep B S Ab: NONREACTIVE

## 2023-08-27 LAB — ANA: Anti Nuclear Antibody (ANA): POSITIVE — AB

## 2023-08-27 LAB — CERULOPLASMIN: Ceruloplasmin: 45 mg/dL (ref 14–48)

## 2023-08-27 LAB — HEPATITIS C ANTIBODY: Hepatitis C Ab: NONREACTIVE

## 2023-08-27 LAB — HEPATITIS B SURFACE ANTIGEN: Hepatitis B Surface Ag: NONREACTIVE

## 2023-08-27 LAB — HEPATITIS A ANTIBODY, TOTAL: Hepatitis A AB,Total: NONREACTIVE

## 2023-08-27 LAB — MITOCHONDRIAL ANTIBODIES: Mitochondrial M2 Ab, IgG: <=20 U (ref <=20.0)

## 2023-08-27 LAB — ALPHA-1-ANTITRYPSIN: A-1 Antitrypsin, Ser: 141 mg/dL (ref 83–199)

## 2023-08-27 LAB — ANTI-SMOOTH MUSCLE ANTIBODY, IGG: Actin (Smooth Muscle) Antibody (IGG): <20 U (ref <20)

## 2023-08-30 ENCOUNTER — Ambulatory Visit: Payer: Self-pay

## 2023-08-30 DIAGNOSIS — R768 Other specified abnormal immunological findings in serum: Secondary | ICD-10-CM

## 2023-08-30 NOTE — Telephone Encounter (Signed)
 first attempt, LVM to return call to [phone]    Copied from CRM [id number]. Topic: Clinical - Red Word Triage >> Aug 30, 2023  4:06 PM Marissa P wrote: Red Word that prompted transfer to Nurse Triage: Patient believes she has lipoma on her back bottom of shoulder blade and its been going on for 6 months that she has been aware of it but just recently began being painful.

## 2023-08-30 NOTE — Telephone Encounter (Signed)
 FYI Only or Action Required?: FYI only for provider.  Patient was last seen in primary care on 11/12/2022 by Georgina Speaks, FNP.  Called Nurse Triage reporting Lipoma.  Symptoms began several months ago.  Interventions attempted: Nothing.  Symptoms are: gradually worsening.  Triage Disposition: See PCP When Office is Open (Within 3 Days)  Patient/caregiver understands and will follow disposition?: Yes    Reason for Disposition  [1] Small swelling or lump AND [2] unexplained AND [3] present > 1 week  Answer Assessment - Initial Assessment Questions 1. APPEARANCE of SWELLING: What does it look like?     Feels a lump under her skin 2. SIZE: How large is the swelling? (e.g., inches, cm; or compare to size of pinhead, tip of pen, eraser, coin, pea, grape, ping pong ball)      About the size of a quarter 3. LOCATION: Where is the swelling located?     Below right shoulder blade 4. ONSET: When did the swelling start?     About six months ago 5. COLOR: What color is it? Is there more than one color?     No discoloration seen 6. PAIN: Is there any pain? If Yes, ask: How bad is the pain? (e.g., scale 1-10; or mild, moderate, severe)     - NONE (0): no pain   - MILD (1-3): doesn't interfere with normal activities    - MODERATE (4-7): interferes with normal activities or awakens from sleep    - SEVERE (8-10): excruciating pain, unable to do any normal activities     3/10, if manipulated with massage, it's sore 7. ITCH: Does it itch? If Yes, ask: How bad is the itch?      denies 8. CAUSE: What do you think caused the swelling?  Patient believes it may be a lipoma 9 OTHER SYMPTOMS: Do you have any other symptoms? (e.g., fever) denies  Protocols used: Skin Lump or Localized Swelling-A-AH

## 2023-08-31 ENCOUNTER — Encounter: Payer: Self-pay | Admitting: Gastroenterology

## 2023-09-01 NOTE — Addendum Note (Signed)
 Addended by: MERCER CRISTINO SAILOR on: 09/01/2023 09:24 AM   Modules accepted: Orders

## 2023-09-01 NOTE — Telephone Encounter (Signed)
 Ambulatory referral to Rheumatology in Epic. MyChart message sent to patient.

## 2023-09-02 ENCOUNTER — Ambulatory Visit: Payer: Self-pay | Admitting: Family Medicine

## 2023-09-05 ENCOUNTER — Other Ambulatory Visit: Payer: Self-pay | Admitting: Medical Genetics

## 2023-09-06 ENCOUNTER — Other Ambulatory Visit (HOSPITAL_COMMUNITY): Payer: Self-pay

## 2023-09-07 NOTE — Progress Notes (Signed)
 Agree with the assessment and plan as outlined by [facility], FNP-C.  Carlitos Bottino, DO, [facility]

## 2023-09-08 ENCOUNTER — Encounter: Payer: Self-pay | Admitting: Gastroenterology

## 2023-09-08 ENCOUNTER — Encounter: Admitting: Gastroenterology

## 2023-09-08 ENCOUNTER — Ambulatory Visit: Admitting: Gastroenterology

## 2023-09-08 VITALS — BP 131/90 | HR 81 | Temp 97.2°F | Resp 14 | Ht 62.0 in | Wt 185.0 lb

## 2023-09-08 DIAGNOSIS — K635 Polyp of colon: Secondary | ICD-10-CM | POA: Diagnosis not present

## 2023-09-08 DIAGNOSIS — R197 Diarrhea, unspecified: Secondary | ICD-10-CM | POA: Diagnosis not present

## 2023-09-08 DIAGNOSIS — D125 Benign neoplasm of sigmoid colon: Secondary | ICD-10-CM

## 2023-09-08 DIAGNOSIS — D122 Benign neoplasm of ascending colon: Secondary | ICD-10-CM

## 2023-09-08 DIAGNOSIS — R1031 Right lower quadrant pain: Secondary | ICD-10-CM

## 2023-09-08 DIAGNOSIS — R194 Change in bowel habit: Secondary | ICD-10-CM

## 2023-09-08 MED ORDER — SODIUM CHLORIDE 0.9 % IV SOLN: 500.0000 mL | INTRAVENOUS | Status: DC

## 2023-09-08 NOTE — Progress Notes (Signed)
 GASTROENTEROLOGY PROCEDURE H&P NOTE   Primary Care Physician: Georgina Speaks, FNP    Reason for Procedure:  Abdominal pain, change in bowel habits, diarrhea, abdominal cramping, fecal urgency  Plan:    Colonoscopy  Patient is appropriate for endoscopic procedure(s) in the ambulatory ([facility]) setting.  The nature of the procedure, as well as the risks, benefits, and alternatives were carefully and thoroughly reviewed with the patient. Ample time for discussion and questions allowed. The patient understood, was satisfied, and agreed to proceed.     HPI: Cindy Gilbert is a 48 y.o. female who presents for colonoscopy for evaluation of change in bowel habits, diarrhea, fecal urgency, abdominal cramping.  Patient was most recently seen in the Gastroenterology Clinic on 08/23/2023.  No interval change in medical history since that appointment. Please refer to that note for full details regarding GI history and clinical presentation.   Past Medical History:  Diagnosis Date   Allergy     Seasonal   Angio-edema    Anxiety 03/22/2018   Asthma    Autoimmune hemolytic anemia (HCC) 04/10/2020   Chronic right hip pain 03/05/2020   Controlled substance agreement signed 12/18/2016   For xanax    COVID-19    Depression    previously on lexapro, celexa,    Dyslipidemia    Heart murmur 12/27/2019   Hip pain, chronic    Right   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/09/2020   Insomnia    Mixed hyperlipidemia 12/06/2019   Morbid obesity (HCC) 10/15/2019   Multinodular goiter (nontoxic) 03/16/2019   Small nodules- no need for bx or follow up imaging 2020   Ovarian cyst    left   Prediabetes 03/05/2020   Right knee pain    S/P laparoscopic sleeve gastrectomy 03/05/2020   Severe obesity (BMI >= 40) (HCC) 10/15/2019   Urticaria    Vitamin D  deficiency    Weight gain 09/09/2022    Past Surgical History:  Procedure Laterality Date   ADENOIDECTOMY     CESAREAN SECTION  2001   COLONOSCOPY WITH PROPOFOL  N/A  02/25/2021   Procedure: COLONOSCOPY WITH PROPOFOL ;  Surgeon: Unk Corinn Skiff, MD;  Location: [facility] ENDOSCOPY;  Service: Endoscopy;  Laterality: N/A;   DILATION AND CURETTAGE OF UTERUS     ESOPHAGOGASTRODUODENOSCOPY     LAPAROSCOPIC GASTRIC SLEEVE RESECTION N/A 03/05/2020   Procedure: LAPAROSCOPIC GASTRIC SLEEVE RESECTION;  Surgeon: Tanda Locus, MD;  Location: [facility];  Service: General;  Laterality: N/A;   TONSILLECTOMY     TYMPANOSTOMY TUBE PLACEMENT     UPPER GI ENDOSCOPY N/A 03/05/2020   Procedure: UPPER GI ENDOSCOPY;  Surgeon: Tanda Locus, MD;  Location: [facility];  Service: General;  Laterality: N/A;    Prior to Admission medications   Medication Sig Start Date End Date Taking? Authorizing Provider  BIOTIN PO Take by mouth.    [provider]  Calcium Carb-Cholecalciferol (CALCIUM 500 + D PO) Take 500 mg by mouth 3 (three) times daily.    [provider]  Cyanocobalamin (B-12 PO) Take by mouth.    [provider]  EPINEPHrine  (EPIPEN  2-PAK) 0.3 mg/0.3 mL IJ SOAJ injection Inject 0.3 mg into the muscle as needed for anaphylaxis. 05/26/23   Jeneal Danita Macintosh, MD  fluticasone  (FLONASE ) 50 MCG/ACT nasal spray Place 2 sprays into both nostrils daily as needed for allergies. 01/14/21   Johnson, Megan P, DO  mometasone  (NASONEX ) 50 MCG/ACT nasal spray Place 2 sprays into the nose daily. 05/27/23   Jeneal Danita Macintosh, MD  Multiple Vitamins-Minerals (MULTIVITAMIN WITH MINERALS) tablet Take 1 tablet by mouth daily.    [provider]  Olopatadine  HCl 0.6 % SOLN Place 2 sprays into the nose 2 (two) times daily as needed. 05/27/23   Jeneal Danita Macintosh, MD  Olopatadine -Mometasone  (RYALTRIS ) [phone] MCG/ACT SUSP Place 2 sprays into the nose 2 (two) times daily as needed (runny or stuffy nose). 05/14/23   Jeneal Danita Macintosh, MD  Olopatadine -Mometasone  (RYALTRIS ) [phone] MCG/ACT SUSP Place 2 sprays into the nose 2 (two) times daily. 05/26/23    Jeneal Danita Macintosh, MD    Current Outpatient Medications  Medication Sig Dispense Refill   BIOTIN PO Take by mouth.     Calcium Carb-Cholecalciferol (CALCIUM 500 + D PO) Take 500 mg by mouth 3 (three) times daily.     Cyanocobalamin (B-12 PO) Take by mouth.     EPINEPHrine  (EPIPEN  2-PAK) 0.3 mg/0.3 mL IJ SOAJ injection Inject 0.3 mg into the muscle as needed for anaphylaxis. 2 each 1   fluticasone  (FLONASE ) 50 MCG/ACT nasal spray Place 2 sprays into both nostrils daily as needed for allergies. 16 g 12   mometasone  (NASONEX ) 50 MCG/ACT nasal spray Place 2 sprays into the nose daily. 17 g 5   Multiple Vitamins-Minerals (MULTIVITAMIN WITH MINERALS) tablet Take 1 tablet by mouth daily.     Olopatadine  HCl 0.6 % SOLN Place 2 sprays into the nose 2 (two) times daily as needed. 30.5 g 5   Olopatadine -Mometasone  (RYALTRIS ) 665-25 MCG/ACT SUSP Place 2 sprays into the nose 2 (two) times daily as needed (runny or stuffy nose). 29 g 5   Olopatadine -Mometasone  (RYALTRIS ) 665-25 MCG/ACT SUSP Place 2 sprays into the nose 2 (two) times daily.     Current Facility-Administered Medications  Medication Dose Route Frequency Provider Last Rate Last Admin   0.9 %  sodium chloride  infusion  500 mL Intravenous Continuous Tannisha Kennington V, DO        Allergies as of 09/08/2023 - Review Complete 09/08/2023  Allergen Reaction Noted   Ambien [zolpidem tartrate] Itching 09/22/2016   Belviq [lorcaserin ] Itching 01/28/2018   Bupropion Itching 09/22/2016   Cymbalta [duloxetine hcl] Other (See Comments) 11/17/2016    Family History  Problem Relation Age of Onset   Hypertension Mother    Hyperlipidemia Mother    Dementia Mother    Urticaria Father    Heart disease Father    Stroke Father    Heart attack Father    Hypertension Father    Atrial fibrillation Maternal Aunt    Cancer Paternal Aunt        Breast   Heart murmur Maternal Grandmother    Asthma Paternal Grandmother    Allergic rhinitis Neg  Hx    Eczema Neg Hx     Social History   Socioeconomic History   Marital status: Single    Spouse name: Not on file   Number of children: Not on file   Years of education: Not on file   Highest education level: Not on file  Occupational History   Not on file  Tobacco Use   Smoking status: Former    Current packs/day: 0.00    Average packs/day: 0.3 packs/day for 10.0 years (2.5 ttl pk-yrs)    Types: Cigarettes    Start date: 02/24/2004    Quit date: 02/23/2014    Years since quitting: 9.5   Smokeless tobacco: Never  Vaping Use   Vaping status: Never Used  Substance and Sexual Activity   Alcohol use: Yes  Alcohol/week: 5.0 standard drinks of alcohol    Types: 5 Glasses of wine per week   Drug use: No   Sexual activity: Not Currently    Birth control/protection: None  Other Topics Concern   Not on file  Social History Narrative   Not on file   Social Drivers of Health   Financial Resource Strain: Not on file  Food Insecurity: Not on file  Transportation Needs: Not on file  Physical Activity: Not on file  Stress: Not on file  Social Connections: Unknown (07/07/2021)   Received from [facility]   Social Network    Social Network: Not on file  Intimate Partner Violence: Unknown (05/29/2021)   Received from [facility]   HITS    Physically Hurt: Not on file    Insult or Talk Down To: Not on file    Threaten Physical Harm: Not on file    Scream or Curse: Not on file    Physical Exam: Vital signs in last 24 hours: @BP  123/84   Pulse 80   Temp (!) 97.2 F (36.2 C)   Ht 5' 2 (1.575 m)   Wt 185 lb (83.9 kg)   LMP  (LMP Unknown)   SpO2 98%   BMI 33.84 kg/m  GEN: NAD EYE: Sclerae anicteric ENT: MMM CV: Non-tachycardic Pulm: CTA b/l GI: Soft, NT/ND NEURO:  Alert & Oriented x 3   Sandor Flatter, DO [facility]   09/08/2023 9:00 AM

## 2023-09-08 NOTE — Op Note (Signed)
 [facility] Patient Name: Cindy Gilbert Procedure Date: 09/08/2023 9:20 AM MRN: [id number] Endoscopist: Sandor Flatter , MD, [id number] Age: 48 Referring MD:  Date of Birth: 1975-10-16 Gender: Female Account #: [id number] Procedure:                Colonoscopy Indications:              Change in bowel habits, Diarrhea, Abdominal cramping Medicines:                Monitored Anesthesia Care Procedure:                Pre-Anesthesia Assessment:                           - Prior to the procedure, a History and Physical                            was performed, and patient medications and                            allergies were reviewed. The patient's tolerance of                            previous anesthesia was also reviewed. The risks                            and benefits of the procedure and the sedation                            options and risks were discussed with the patient.                            All questions were answered, and informed consent                            was obtained. Prior Anticoagulants: The patient has                            taken no anticoagulant or antiplatelet agents. ASA                            Grade Assessment: II - A patient with mild systemic                            disease. After reviewing the risks and benefits,                            the patient was deemed in satisfactory condition to                            undergo the procedure.                           After obtaining informed consent, the colonoscope  was passed under direct vision. Throughout the                            procedure, the patient's blood pressure, pulse, and                            oxygen saturations were monitored continuously. The                            CF [id number] #[id number] was introduced through the anus                            and advanced to the 10 cm into the ileum. The                             colonoscopy was performed without difficulty. The                            patient tolerated the procedure well. The quality                            of the bowel preparation was good. The terminal                            ileum, ileocecal valve, appendiceal orifice, and                            rectum were photographed. Scope In: 9:28:16 AM Scope Out: 9:46:42 AM Scope Withdrawal Time: 0 hours 15 minutes 44 seconds  Total Procedure Duration: 0 hours 18 minutes 26 seconds  Findings:                 The perianal and digital rectal examinations were                            normal.                           A 3 mm polyp was found in the ascending colon. The                            polyp was sessile. The polyp was removed with a                            cold snare. Resection and retrieval were complete.                            Estimated blood loss was minimal.                           A 4 mm polyp was found in the sigmoid colon. The                            polyp was sessile. The polyp  was removed with a                            cold snare. Resection and retrieval were complete.                            Estimated blood loss was minimal.                           The mucosa was otherwise normal throughout the                            colon. No areas of mucosal erythema, edema,                            erosions, or ulceration. Biopsies for histology                            were taken with a cold forceps from the right colon                            and left colon for evaluation of microscopic                            colitis. Estimated blood loss was minimal.                           The retroflexed view of the distal rectum and anal                            verge was normal and showed no anal or rectal                            abnormalities.                           The terminal ileum appeared normal. Complications:            No immediate  complications. Estimated Blood Loss:     Estimated blood loss was minimal. Impression:               - One 3 mm polyp in the ascending colon, removed                            with a cold snare. Resected and retrieved.                           - One 4 mm polyp in the sigmoid colon, removed with                            a cold snare. Resected and retrieved.                           - Normal mucosa in the entire examined colon.  Biopsied.                           - The distal rectum and anal verge are normal on                            retroflexion view.                           - The examined portion of the ileum was normal. Recommendation:           - Patient has a contact number available for                            emergencies. The signs and symptoms of potential                            delayed complications were discussed with the                            patient. Return to normal activities tomorrow.                            Written discharge instructions were provided to the                            patient.                           - Resume previous diet.                           - Continue present medications.                           - Await pathology results.                           - Repeat colonoscopy for surveillance based on                            pathology results. Sandor Flatter, MD 09/08/2023 9:52:43 AM

## 2023-09-08 NOTE — Telephone Encounter (Signed)
 Patient completed procedure this morning

## 2023-09-08 NOTE — Progress Notes (Signed)
 Report to PACU, RN, vss, BBS= Clear.

## 2023-09-08 NOTE — Patient Instructions (Signed)
 Resume all of your previous medications today as ordered.  Read your discharge instructions given to you by your recovery room nurse.    YOU HAD AN ENDOSCOPIC PROCEDURE TODAY AT THE  ENDOSCOPY CENTER:   Refer to the procedure report that was given to you for any specific questions about what was found during the examination.  If the procedure report does not answer your questions, please call your gastroenterologist to clarify.  If you requested that your care partner not be given the details of your procedure findings, then the procedure report has been included in a sealed envelope for you to review at your convenience later.  YOU SHOULD EXPECT: Some feelings of bloating in the abdomen. Passage of more gas than usual.  Walking can help get rid of the air that was put into your GI tract during the procedure and reduce the bloating. If you had a lower endoscopy (such as a colonoscopy or flexible sigmoidoscopy) you may notice spotting of blood in your stool or on the toilet paper. If you underwent a bowel prep for your procedure, you may not have a normal bowel movement for a few days.  Please Note:  You might notice some irritation and congestion in your nose or some drainage.  This is from the oxygen used during your procedure.  There is no need for concern and it should clear up in a day or so.  SYMPTOMS TO REPORT IMMEDIATELY:  Following lower endoscopy (colonoscopy or flexible sigmoidoscopy):  Excessive amounts of blood in the stool  Significant tenderness or worsening of abdominal pains  Swelling of the abdomen that is new, acute  Fever of 100F or higher   For urgent or emergent issues, a gastroenterologist can be reached at any hour by calling [phone]. Do not use MyChart messaging for urgent concerns.    DIET:  We do recommend a small meal at first, but then you may proceed to your regular diet.  Drink plenty of fluids but you should avoid alcoholic beverages for 24  hours.  ACTIVITY:  You should plan to take it easy for the rest of today and you should NOT DRIVE or use heavy machinery until tomorrow (because of the sedation medicines used during the test).    FOLLOW UP: Our staff will call the number listed on your records the next business day following your procedure.  We will call around 7:15- 8:00 am to check on you and address any questions or concerns that you may have regarding the information given to you following your procedure. If we do not reach you, we will leave a message.     If any biopsies were taken you will be contacted by phone or by letter within the next 1-3 weeks.  Please call us  at [phone] if you have not heard about the biopsies in 3 weeks.    SIGNATURES/CONFIDENTIALITY: You and/or your care partner have signed paperwork which will be entered into your electronic medical record.  These signatures attest to the fact that that the information above on your After Visit Summary has been reviewed and is understood.  Full responsibility of the confidentiality of this discharge information lies with you and/or your care-partner.

## 2023-09-09 ENCOUNTER — Telehealth: Payer: Self-pay

## 2023-09-09 NOTE — Telephone Encounter (Signed)
  Follow up Call-     09/08/2023    9:01 AM  Call back number  Post procedure Call Back phone  # [phone]  Permission to leave phone message Yes     Patient questions:  Do you have a fever, pain , or abdominal swelling? No. Pain Score  0 *  Have you tolerated food without any problems? Yes.    Have you been able to return to your normal activities? Yes.    Do you have any questions about your discharge instructions: Diet   No. Medications  No. Follow up visit  No.  Do you have questions or concerns about your Care? No.  Actions: * If pain score is 4 or above: No action needed, pain <4.

## 2023-09-13 ENCOUNTER — Encounter: Payer: Commercial Managed Care - HMO | Admitting: Nurse Practitioner

## 2023-09-13 ENCOUNTER — Other Ambulatory Visit (HOSPITAL_COMMUNITY): Payer: Self-pay

## 2023-09-14 LAB — SURGICAL PATHOLOGY

## 2023-09-21 ENCOUNTER — Other Ambulatory Visit

## 2023-09-21 ENCOUNTER — Ambulatory Visit: Payer: Self-pay | Admitting: Gastroenterology

## 2023-09-27 ENCOUNTER — Other Ambulatory Visit (HOSPITAL_COMMUNITY): Payer: Self-pay

## 2023-09-27 NOTE — Telephone Encounter (Signed)
 Spoke with insurance, they stated there was a call scheduled for 08/30/2023 but no one participated and they upheld the denial. The case is still opened and a letter will not be sent until the 90 day period is complete.

## 2023-09-28 ENCOUNTER — Telehealth: Payer: Self-pay | Admitting: Gastroenterology

## 2023-09-28 NOTE — Telephone Encounter (Signed)
 Referral sent to [facility], [place] along with pt records. Pt made aware. Pt verbalized understanding with all questions answered.

## 2023-09-28 NOTE — Telephone Encounter (Signed)
 Patient is requesting to have her Rheumatology referral specifically sent to [facility], [place]. Please advise, Thank you

## 2023-09-29 NOTE — Telephone Encounter (Signed)
 Spoke with patient about Ryaltris  denial patient verbalized understanding no concerns or questions.

## 2023-10-18 ENCOUNTER — Other Ambulatory Visit: Payer: Self-pay | Admitting: Gastroenterology

## 2023-10-18 DIAGNOSIS — K580 Irritable bowel syndrome with diarrhea: Secondary | ICD-10-CM

## 2023-10-18 MED ORDER — RIFAXIMIN 550 MG PO TABS: 550.0000 mg | ORAL_TABLET | Freq: Three times a day (TID) | ORAL | 2 refills | Status: AC

## 2023-10-18 NOTE — Progress Notes (Signed)
 Xifaxan  sent in for suspected IBS/Sibo

## 2023-10-19 ENCOUNTER — Telehealth: Payer: Self-pay

## 2023-10-19 NOTE — Telephone Encounter (Signed)
 Pharmacy Patient Advocate Encounter   Received notification from CoverMyMeds that prior authorization for Xifaxan  550MG  tablets is required/requested.   Insurance verification completed.   The patient is insured through [employer] .   Per test claim: PA required; PA submitted to above mentioned insurance via Latent Key/confirmation #/EOC [id number] Status is pending

## 2023-10-20 NOTE — Telephone Encounter (Signed)
 Pharmacy Patient Advocate Encounter  Received notification from [employer] that Prior Authorization for Xifaxan  550MG  tablet has been APPROVED from 10-19-2023 to 11-02-2023   PA #/Case ID/Reference #: [id number]

## 2023-11-25 ENCOUNTER — Ambulatory Visit: Admitting: Allergy

## 2023-11-25 ENCOUNTER — Other Ambulatory Visit: Payer: Self-pay

## 2023-11-25 ENCOUNTER — Encounter: Payer: Self-pay | Admitting: Allergy

## 2023-11-25 VITALS — BP 140/100 | HR 92 | Temp 98.1°F

## 2023-11-25 DIAGNOSIS — H1013 Acute atopic conjunctivitis, bilateral: Secondary | ICD-10-CM

## 2023-11-25 DIAGNOSIS — K529 Noninfective gastroenteritis and colitis, unspecified: Secondary | ICD-10-CM | POA: Diagnosis not present

## 2023-11-25 DIAGNOSIS — J302 Other seasonal allergic rhinitis: Secondary | ICD-10-CM

## 2023-11-25 DIAGNOSIS — T7819XD Other adverse food reactions, not elsewhere classified, subsequent encounter: Secondary | ICD-10-CM

## 2023-11-25 DIAGNOSIS — J3089 Other allergic rhinitis: Secondary | ICD-10-CM

## 2023-11-25 MED ORDER — CARBINOXAMINE MALEATE 6 MG PO TABS: 1.0000 | ORAL_TABLET | Freq: Two times a day (BID) | ORAL | 2 refills | Status: AC

## 2023-11-25 MED ORDER — RYALTRIS 665-25 MCG/ACT NA SUSP: 2.0000 | Freq: Two times a day (BID) | NASAL | 5 refills | Status: AC | PRN

## 2023-11-25 NOTE — Progress Notes (Signed)
 Follow-up Note  RE: Cindy Gilbert MRN: [id number] DOB: 1975-09-05 Date of Office Visit: 11/25/2023   History of present illness: Cindy Gilbert is a 48 y.o. female presenting today for follow-up of allergic rhinitis with conjunctivitis, pollen food allergy  syndrome and chronic diarrhea.  She was last seen in the office on 05/26/2023 by myself..  Discussed the use of AI scribe software for clinical note transcription with the patient, who gave verbal consent to proceed.  She experiences blocked ears, nasal congestion, and facial pressure following a camping and hiking trip from Friday to Sunday in [place] . No fever is present.  No known sick exposures.  She uses saline rinses with a squeezy bottle as needed but did not use it during this recent episode. She has been using a sample of Ryaltris  nasal spray, which she finds the most effective effective, but it is not covered by her insurance.  That she has tried to ration out the sample that she has.  She has also tried other nasal sprays and antihistamines, including Xyzal, which just has not been effective for her. She continues to avoid banana and pineapple due to pollen-related allergies.  She has a history of gastrointestinal issues, specifically diarrhea, which she associates with stress and believes may be stress driven irritable bowel syndrome (IBS). She is working with her gastroenterologist to manage these symptoms. She notes no specific dietary triggers.     Review of systems: 10pt ROS negative unless noted above in HPI   Past medical/social/surgical/family history have been reviewed and are unchanged unless specifically indicated below.  No changes  Medication List: Current Outpatient Medications  Medication Sig Dispense Refill   BIOTIN PO Take by mouth.     Calcium Carb-Cholecalciferol (CALCIUM 500 + D PO) Take 500 mg by mouth 3 (three) times daily.     Carbinoxamine Maleate (RYVENT) 6 MG TABS Take 1 tablet (6 mg  total) by mouth 2 (two) times daily. 60 tablet 2   Cyanocobalamin (B-12 PO) Take by mouth.     EPINEPHrine  (EPIPEN  2-PAK) 0.3 mg/0.3 mL IJ SOAJ injection Inject 0.3 mg into the muscle as needed for anaphylaxis. 2 each 1   hydrOXYzine (ATARAX) 25 MG tablet Take 25 mg by mouth 2 (two) times daily.     lisdexamfetamine (VYVANSE) 30 MG capsule Take 30 mg by mouth every morning.     mometasone  (NASONEX ) 50 MCG/ACT nasal spray Place 2 sprays into the nose daily. 17 g 5   Multiple Vitamins-Minerals (MULTIVITAMIN WITH MINERALS) tablet Take 1 tablet by mouth daily.     Olopatadine -Mometasone  (RYALTRIS ) 665-25 MCG/ACT SUSP Place 2 sprays into the nose 2 (two) times daily.     Olopatadine -Mometasone  (RYALTRIS ) 665-25 MCG/ACT SUSP Place 2 sprays into the nose 2 (two) times daily as needed (runny or stuffy nose). 29 g 5   No current facility-administered medications for this visit.     Known medication allergies: Allergies  Allergen Reactions   Ambien [Zolpidem Tartrate] Itching   Belviq [Lorcaserin ] Itching   Bupropion Itching   Cymbalta [Duloxetine Hcl] Other (See Comments)    Headache     Physical examination: Blood pressure (!) 140/100, pulse 92, temperature 98.1 F (36.7 C), SpO2 97%.  General: Alert, interactive, in no acute distress. HEENT: PERRLA, TMs dull with fluid bilaterally without erythema or bulging, turbinates moderately edematous without discharge, post-pharynx non erythematous. Neck: Supple without lymphadenopathy. Lungs: Clear to auscultation without wheezing, rhonchi or rales. {no increased work of breathing. CV: Normal S1, S2  without murmurs. Abdomen: Nondistended, nontender. Skin: Warm and dry, without lesions or rashes. Extremities:  No clubbing, cyanosis or edema. Neuro:   Grossly intact.  Diagnostics/Labs: None today  Assessment and plan: Rhinitis with conjunctivitis, allergic - Xyzal not effective thus will try prescription, Ryvent 6mg  1 tab twice a day.  -  Use Ryaltris  nasal spray for enhanced nasal symptom control.  This is a combination nasal spray with Mometasone  (steroid for congestion control) and Olopatadine  (antihistamine for drainage control).  Ryaltris  2 sprays each nostril twice a day as needed for runny or stuffy nose.   Will send to your local pharmacy and see if you can use GoodRx - Advise saline rinses before nasal sprays. - With using nasal sprays point tip of bottle toward eye on same side nostril and lean head slightly forward for best technique.   - Continue avoidance measures for ragweed, weeds, dust mites, and cockroach - Consider allergy  shots as a means of long-term control if medication management is not effective enough. - Allergy  shots re-train and reset the immune system to ignore environmental allergens and decrease the resulting immune response to those allergens (sneezing, itchy watery eyes, runny nose, nasal congestion, etc).    - Allergy  shots improve symptoms in 75-85% of patients.  - We can discuss more at a future appointment if the medications are not working for you.   Oral Allergy  Syndrome - Bananas and pineapple skin testing are negative and you have pollen allergy  confimring OAS. - The oral allergy  syndrome (OAS) or pollen-food allergy  syndrome (PFAS) is a relatively common form of food allergy , particularly in adults. It typically occurs in people who have pollen allergies when the immune system sees proteins on the food that look like proteins on the pollen. This results in the allergy  antibody (IgE) binding to the food instead of the pollen. Patients typically report itching and/or mild swelling of the mouth and throat immediately following ingestion of certain uncooked fruits (including nuts) or raw vegetables. Only a very small number of affected individuals experience systemic allergic reactions, such as anaphylaxis which occurs with true food allergies.    Chronic Diarrhea - Food allergy  testing was  positive to lamb, egg, white potato, sweet potato, cherry, cantaloupe, nutmeg, ginger - If you do not have symptoms after eating these foods then you are sensitized only and not allergic and should keep the food in diet.  - we have discussed the following in regards to foods:   Allergy : food allergy  is when you have eaten a food, developed an allergic reaction after eating the food and have IgE to the food (positive food testing either by skin testing or blood testing).  Food allergy  could lead to life threatening symptoms  Sensitivity: occurs when you have IgE to a food (positive food testing either by skin testing or blood testing) but is a food you eat without any issues.  This is not an allergy  and we recommend keeping the food in the diet  Intolerance: this is when you have negative testing by either skin testing or blood testing thus not allergic but the food causes symptoms (like belly pain, bloating, diarrhea etc) with ingestion.  These foods should be avoided to prevent symptoms.   - Have access to self-injectable epinephrine  (Epipen  or AuviQ) 0.3mg  at all times - Follow emergency action plan in case of allergic reaction   Routine follow-up visit in 6 months or sooner if needed   I appreciate the opportunity to take part in Cindy Gilbert's  care. Please do not hesitate to contact me with questions.  Sincerely,   Danita Brain, MD Allergy /Immunology [facility]

## 2023-11-25 NOTE — Patient Instructions (Addendum)
 Rhinitis with conjunctivitis, allergic - Xyzal not effective thus will try prescription, Ryvent 6mg  1 tab twice a day.  - Use Ryaltris  nasal spray for enhanced nasal symptom control.  This is a combination nasal spray with Mometasone  (steroid for congestion control) and Olopatadine  (antihistamine for drainage control).  Ryaltris  2 sprays each nostril twice a day as needed for runny or stuffy nose.   Will send to your local pharmacy and see if you can use GoodRx - Advise saline rinses before nasal sprays. - With using nasal sprays point tip of bottle toward eye on same side nostril and lean head slightly forward for best technique.   - Continue avoidance measures for ragweed, weeds, dust mites, and cockroach - Consider allergy  shots as a means of long-term control if medication management is not effective enough. - Allergy  shots re-train and reset the immune system to ignore environmental allergens and decrease the resulting immune response to those allergens (sneezing, itchy watery eyes, runny nose, nasal congestion, etc).    - Allergy  shots improve symptoms in 75-85% of patients.  - We can discuss more at a future appointment if the medications are not working for you.   Oral Allergy  Syndrome - Bananas and pineapple skin testing are negative and you have pollen allergy  confimring OAS. - The oral allergy  syndrome (OAS) or pollen-food allergy  syndrome (PFAS) is a relatively common form of food allergy , particularly in adults. It typically occurs in people who have pollen allergies when the immune system sees proteins on the food that look like proteins on the pollen. This results in the allergy  antibody (IgE) binding to the food instead of the pollen. Patients typically report itching and/or mild swelling of the mouth and throat immediately following ingestion of certain uncooked fruits (including nuts) or raw vegetables. Only a very small number of affected individuals experience systemic  allergic reactions, such as anaphylaxis which occurs with true food allergies.       Chronic Diarrhea - Food allergy  testing was positive to lamb, egg, white potato, sweet potato, cherry, cantaloupe, nutmeg, ginger - If you do not have symptoms after eating these foods then you are sensitized only and not allergic and should keep the food in diet.  - we have discussed the following in regards to foods:   Allergy : food allergy  is when you have eaten a food, developed an allergic reaction after eating the food and have IgE to the food (positive food testing either by skin testing or blood testing).  Food allergy  could lead to life threatening symptoms  Sensitivity: occurs when you have IgE to a food (positive food testing either by skin testing or blood testing) but is a food you eat without any issues.  This is not an allergy  and we recommend keeping the food in the diet  Intolerance: this is when you have negative testing by either skin testing or blood testing thus not allergic but the food causes symptoms (like belly pain, bloating, diarrhea etc) with ingestion.  These foods should be avoided to prevent symptoms.   - Have access to self-injectable epinephrine  (Epipen  or AuviQ) 0.3mg  at all times - Follow emergency action plan in case of allergic reaction   Routine follow-up visit in 6 months or sooner if needed

## 2023-12-16 ENCOUNTER — Other Ambulatory Visit: Payer: Self-pay | Admitting: Medical Genetics

## 2023-12-16 DIAGNOSIS — Z006 Encounter for examination for normal comparison and control in clinical research program: Secondary | ICD-10-CM

## 2024-01-03 ENCOUNTER — Encounter: Payer: Self-pay | Admitting: Nurse Practitioner

## 2024-01-03 ENCOUNTER — Ambulatory Visit: Payer: Self-pay | Admitting: Nurse Practitioner

## 2024-01-03 VITALS — BP 124/74 | HR 60 | Temp 98.6°F | Ht 62.0 in | Wt 187.0 lb

## 2024-01-03 DIAGNOSIS — Z79899 Other long term (current) drug therapy: Secondary | ICD-10-CM | POA: Diagnosis not present

## 2024-01-03 DIAGNOSIS — F1010 Alcohol abuse, uncomplicated: Secondary | ICD-10-CM

## 2024-01-03 DIAGNOSIS — F5101 Primary insomnia: Secondary | ICD-10-CM | POA: Diagnosis not present

## 2024-01-03 DIAGNOSIS — Z13228 Encounter for screening for other metabolic disorders: Secondary | ICD-10-CM

## 2024-01-03 DIAGNOSIS — E559 Vitamin D deficiency, unspecified: Secondary | ICD-10-CM

## 2024-01-03 DIAGNOSIS — F900 Attention-deficit hyperactivity disorder, predominantly inattentive type: Secondary | ICD-10-CM | POA: Diagnosis not present

## 2024-01-03 DIAGNOSIS — K529 Noninfective gastroenteritis and colitis, unspecified: Secondary | ICD-10-CM

## 2024-01-03 DIAGNOSIS — Z0000 Encounter for general adult medical examination without abnormal findings: Secondary | ICD-10-CM | POA: Diagnosis not present

## 2024-01-03 DIAGNOSIS — E782 Mixed hyperlipidemia: Secondary | ICD-10-CM | POA: Diagnosis not present

## 2024-01-03 DIAGNOSIS — Z139 Encounter for screening, unspecified: Secondary | ICD-10-CM

## 2024-01-03 DIAGNOSIS — E042 Nontoxic multinodular goiter: Secondary | ICD-10-CM | POA: Diagnosis not present

## 2024-01-03 NOTE — Progress Notes (Signed)
 LILLETTE Kristeen JINNY Gladis, CMA,acting as a neurosurgeon for Gaines Ada, FNP.,have documented all relevant documentation on the behalf of Gaines Ada, FNP,as directed by  Gaines Ada, FNP while in the presence of Gaines Ada, FNP.  Subjective:    Patient ID: Cindy Gilbert , female    DOB: 28-Oct-1975 , 48 y.o.   MRN: [id number]  Chief Complaint  Patient presents with   Annual Exam    Patient presents today for HM, Patient reports compliance with medication. Patient denies any chest pain, SOB, or headaches. Patient has no concerns today.     HPI Discussed the use of AI scribe software for clinical note transcription with the patient, who gave verbal consent to proceed.  History of Present Illness Cindy Gilbert is a 48 year old female who presents for an annual physical exam.  She has been diagnosed with ADHD since her last visit and is currently taking Vyvanse. Vyvanse has helped reduce her alcohol consumption from five days a week to mainly weekends, and she has experienced a decrease in appetite, resulting in a weight loss of approximately 12 to 13 pounds since her last visit.  She has been experiencing chronic diarrhea and is under the care of a GI specialist. She underwent a colonoscopy and is taking a probiotic and aloe to manage her symptoms. No specific dietary changes have been made as there is no clear pattern to the diarrhea episodes.  She maintains a decent diet, drinks water  regularly, and exercises three to four days a week, engaging in activities such as Pilates, yoga, and walking. She has seen a gynecologist in June or July and had a Pap smear and mammogram done, with the latter performed at [facility] in April.  No trouble swallowing, shortness of breath, chest pain, swelling in the ankles, or significant joint pain.  She has a follow-up appointment with a rheumatologist in December due to an out-of-range ANA test.  She has seen a GI specialist - GI issues  with chronic diarrhea continues to work on, she had a colonscopy during that time.  Psychiatric NP - ADHD - Vyvanse.  She is followed by Dr. Rutherford in June/July with PAP     Past Medical History:  Diagnosis Date   Allergy     Seasonal   Angio-edema    Anxiety 03/22/2018   Asthma    Autoimmune hemolytic anemia (HCC) 04/10/2020   Blood transfusion without reported diagnosis 02/2020   Chronic right hip pain 03/05/2020   Controlled substance agreement signed 12/18/2016   For xanax    COVID-19    Depression    previously on lexapro, celexa,    Dyslipidemia    Heart murmur 12/27/2019   Hip pain, chronic    Right   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/09/2020   Insomnia    Mixed hyperlipidemia 12/06/2019   Morbid obesity (HCC) 10/15/2019   Multinodular goiter (nontoxic) 03/16/2019   Small nodules- no need for bx or follow up imaging 2020   Ovarian cyst    left   Prediabetes 03/05/2020   Right knee pain    S/P laparoscopic sleeve gastrectomy 03/05/2020   Severe obesity (BMI >= 40) (HCC) 10/15/2019   Urticaria    Vitamin D  deficiency    Weight gain 09/09/2022     Family History  Problem Relation Age of Onset   Hypertension Mother    Hyperlipidemia Mother    Dementia Mother    Urticaria Father    Heart disease Father    Stroke Father  Heart attack Father    Hypertension Father    Atrial fibrillation Maternal Aunt    Cancer Paternal Aunt        Breast   Heart murmur Maternal Grandmother    Asthma Paternal Grandmother    Allergic rhinitis Neg Hx    Eczema Neg Hx      Current Outpatient Medications:    BIOTIN PO, Take by mouth., Disp: , Rfl:    Calcium Carb-Cholecalciferol (CALCIUM 500 + D PO), Take 500 mg by mouth 3 (three) times daily., Disp: , Rfl:    Carbinoxamine Maleate (RYVENT) 6 MG TABS, Take 1 tablet (6 mg total) by mouth 2 (two) times daily., Disp: 60 tablet, Rfl: 2   Cyanocobalamin (B-12 PO), Take by mouth., Disp: , Rfl:    EPINEPHrine  (EPIPEN  2-PAK) 0.3 mg/0.3 mL IJ SOAJ  injection, Inject 0.3 mg into the muscle as needed for anaphylaxis., Disp: 2 each, Rfl: 1   hydrOXYzine (ATARAX) 25 MG tablet, Take 25 mg by mouth 2 (two) times daily., Disp: , Rfl:    lisdexamfetamine (VYVANSE) 30 MG capsule, Take 30 mg by mouth every morning., Disp: , Rfl:    mometasone  (NASONEX ) 50 MCG/ACT nasal spray, Place 2 sprays into the nose daily., Disp: 17 g, Rfl: 5   Multiple Vitamins-Minerals (MULTIVITAMIN WITH MINERALS) tablet, Take 1 tablet by mouth daily., Disp: , Rfl:    Olopatadine -Mometasone  (RYALTRIS ) 665-25 MCG/ACT SUSP, Place 2 sprays into the nose 2 (two) times daily., Disp: , Rfl:    Olopatadine -Mometasone  (RYALTRIS ) 665-25 MCG/ACT SUSP, Place 2 sprays into the nose 2 (two) times daily as needed (runny or stuffy nose)., Disp: 29 g, Rfl: 5   temazepam (RESTORIL) 7.5 MG capsule, Take 7.5 mg by mouth at bedtime as needed., Disp: , Rfl:    Allergies  Allergen Reactions   Ambien [Zolpidem Tartrate] Itching   Belviq [Lorcaserin ] Itching   Bupropion Itching   Cymbalta [Duloxetine Hcl] Other (See Comments)    Headache      The patient states she uses post menopausal status for birth control. No LMP recorded (lmp unknown). Patient is postmenopausal.  Negative for: breast discharge, breast lump(s), breast pain and breast self exam. Associated symptoms include abnormal vaginal bleeding. Pertinent negatives include abnormal bleeding (hematology), anxiety, decreased libido, depression, difficulty falling sleep, dyspareunia, history of infertility, nocturia, sexual dysfunction, sleep disturbances, urinary incontinence, urinary urgency, vaginal discharge and vaginal itching. Diet regular; feels they are decent; she does drink water  on regular basis. The patient states her exercise level is moderate 3-4 days a week with pilates, yoga and walking.   The patient's tobacco use is:  Social History   Tobacco Use  Smoking Status Former   Current packs/day: 0.00   Average packs/day: 0.3  packs/day for 10.0 years (2.5 ttl pk-yrs)   Types: Cigarettes   Start date: 02/24/2004   Quit date: 02/23/2014   Years since quitting: 9.8  Smokeless Tobacco Never  . She has been exposed to passive smoke. The patient's alcohol use is:  Social History   Substance and Sexual Activity  Alcohol Use Yes   Alcohol/week: 3.0 standard drinks of alcohol   Types: 3 Glasses of wine per week   Comment: since starting the Vyvanse   Additional information: Last pap 05/26/2021, next one scheduled for 05/26/2024.    Review of Systems  Constitutional: Negative.   HENT: Negative.    Eyes: Negative.   Respiratory: Negative.    Cardiovascular: Negative.   Gastrointestinal: Negative.   Endocrine: Negative.  Genitourinary: Negative.   Musculoskeletal: Negative.   Skin: Negative.   Allergic/Immunologic: Negative.   Neurological: Negative.   Hematological: Negative.   Psychiatric/Behavioral: Negative.       Today's Vitals   01/03/24 1412  BP: 124/74  Pulse: 60  Temp: 98.6 F (37 C)  TempSrc: Oral  Weight: 187 lb (84.8 kg)  Height: 5' 2 (1.575 m)  PainSc: 0-No pain   Body mass index is 34.2 kg/m.  Wt Readings from Last 3 Encounters:  01/03/24 187 lb (84.8 kg)  09/08/23 185 lb (83.9 kg)  08/23/23 185 lb (83.9 kg)     Objective:  Physical Exam Vitals and nursing note reviewed.  Constitutional:      General: She is not in acute distress.    Appearance: Normal appearance. She is well-developed. She is obese.  HENT:     Head: Normocephalic and atraumatic.     Right Ear: Hearing, tympanic membrane, ear canal and external ear normal. There is no impacted cerumen.     Left Ear: Hearing, tympanic membrane, ear canal and external ear normal. There is no impacted cerumen.     Nose: Nose normal.     Mouth/Throat:     Mouth: Mucous membranes are moist.  Eyes:     General: Lids are normal.     Extraocular Movements: Extraocular movements intact.     Conjunctiva/sclera: Conjunctivae normal.      Pupils: Pupils are equal, round, and reactive to light.     Funduscopic exam:    Right eye: No papilledema.        Left eye: No papilledema.  Neck:     Thyroid : No thyroid  mass.     Vascular: No carotid bruit.  Cardiovascular:     Rate and Rhythm: Normal rate and regular rhythm.     Pulses: Normal pulses.     Heart sounds: Normal heart sounds. No murmur heard. Pulmonary:     Effort: Pulmonary effort is normal. No respiratory distress.     Breath sounds: Normal breath sounds. No wheezing.  Chest:     Chest wall: No mass.  Breasts:    Tanner Score is 5.     Right: Normal. No mass or tenderness.     Left: Normal. No mass or tenderness.  Abdominal:     General: Abdomen is flat. Bowel sounds are normal. There is no distension.     Palpations: Abdomen is soft.     Tenderness: There is no abdominal tenderness.  Genitourinary:    Rectum: Guaiac result negative.  Musculoskeletal:        General: No swelling. Normal range of motion.     Cervical back: Full passive range of motion without pain, normal range of motion and neck supple.     Right lower leg: No edema.     Left lower leg: No edema.  Lymphadenopathy:     Upper Body:     Right upper body: No supraclavicular, axillary or pectoral adenopathy.     Left upper body: No supraclavicular, axillary or pectoral adenopathy.  Skin:    General: Skin is warm and dry.     Capillary Refill: Capillary refill takes less than 2 seconds.  Neurological:     General: No focal deficit present.     Mental Status: She is alert and oriented to person, place, and time.     Cranial Nerves: No cranial nerve deficit.     Sensory: No sensory deficit.     Motor: No weakness.  Psychiatric:        Mood and Affect: Mood normal.        Behavior: Behavior normal.        Thought Content: Thought content normal.        Judgment: Judgment normal.         Assessment And Plan:     Encounter for annual health examination Assessment & Plan: Routine  visit with no significant health changes. Improved exercise, diet, and reduced alcohol use. Weight loss noted. - Continue current exercise regimen (Pilates, yoga, walking) 3-4 days a week. - Continue to monitor alcohol consumption and maintain current reduction.   Mixed hyperlipidemia -     CMP14+EGFR -     Lipid panel  Vitamin D  deficiency Assessment & Plan: Will check vitamin D  level and supplement as needed.    Also encouraged to spend 15 minutes in the sun daily.    Orders: -     VITAMIN D  25 Hydroxy (Vit-D Deficiency, Fractures)  Primary insomnia Assessment & Plan: Currently stable.   Encounter for screening for metabolic disorder -     Hemoglobin A1c  Encounter for screening  Other long term (current) drug therapy -     CBC with Differential/Platelet  Multinodular goiter (nontoxic) Assessment & Plan: No change in size or symptoms. No swallowing difficulty or neck enlargement. - Monitor for symptoms such as difficulty swallowing or neck enlargement. - Checked thyroid  levels to ensure stability.  Orders: -     TSH  Alcohol abuse Assessment & Plan: Alcohol use reduced to three days a week. Vyvanse aiding in reducing cravings. - Continue Vyvanse as prescribed.   Chronic diarrhea Assessment & Plan: Chronic diarrhea under evaluation. Colonoscopy performed. Probiotic and aloe added. No dietary triggers identified. - Continue probiotic and aloe regimen.   Attention deficit hyperactivity disorder (ADHD), predominantly inattentive type Assessment & Plan: This is a new diagnosis for her by her behavioral health specialist. ADHD managed with Vyvanse, aiding in reducing alcohol cravings and appetite. - Continue Vyvanse as prescribed.     Return for 1 year physical, 6 month chol check. Patient was given opportunity to ask questions. Patient verbalized understanding of the plan and was able to repeat key elements of the plan. All questions were answered to their  satisfaction.   Gaines Ada, FNP  I, Gaines Ada, FNP, have reviewed all documentation for this visit. The documentation on 01/03/24 for the exam, diagnosis, procedures, and orders are all accurate and complete.

## 2024-01-03 NOTE — Assessment & Plan Note (Signed)
 Routine visit with no significant health changes. Improved exercise, diet, and reduced alcohol use. Weight loss noted. - Continue current exercise regimen (Pilates, yoga, walking) 3-4 days a week. - Continue to monitor alcohol consumption and maintain current reduction.

## 2024-01-04 LAB — TSH: TSH: 0.951 u[IU]/mL (ref 0.450–4.500)

## 2024-01-04 LAB — CMP14+EGFR
ALT: 17 IU/L (ref 0–32)
AST: 20 IU/L (ref 0–40)
Albumin: 4.4 g/dL (ref 3.9–4.9)
Alkaline Phosphatase: 95 IU/L (ref 41–116)
BUN/Creatinine Ratio: 16 (ref 9–23)
BUN: 11 mg/dL (ref 6–24)
Bilirubin Total: 0.8 mg/dL (ref 0.0–1.2)
CO2: 24 mmol/L (ref 20–29)
Calcium: 9.6 mg/dL (ref 8.7–10.2)
Chloride: 101 mmol/L (ref 96–106)
Creatinine, Ser: 0.7 mg/dL (ref 0.57–1.00)
Globulin, Total: 2.4 g/dL (ref 1.5–4.5)
Glucose: 79 mg/dL (ref 70–99)
Potassium: 4 mmol/L (ref 3.5–5.2)
Sodium: 140 mmol/L (ref 134–144)
Total Protein: 6.8 g/dL (ref 6.0–8.5)
eGFR: 107 mL/min/1.73 (ref >59)

## 2024-01-04 LAB — CBC WITH DIFFERENTIAL/PLATELET
Basophils Absolute: 0.1 x10E3/uL (ref 0.0–0.2)
Basos: 1 %
EOS (ABSOLUTE): 0.2 x10E3/uL (ref 0.0–0.4)
Eos: 3 %
Hematocrit: 45.2 % (ref 34.0–46.6)
Hemoglobin: 14.4 g/dL (ref 11.1–15.9)
Immature Grans (Abs): 0 x10E3/uL (ref 0.0–0.1)
Immature Granulocytes: 0 %
Lymphocytes Absolute: 2.8 x10E3/uL (ref 0.7–3.1)
Lymphs: 46 %
MCH: 28.9 pg (ref 26.6–33.0)
MCHC: 31.9 g/dL (ref 31.5–35.7)
MCV: 91 fL (ref 79–97)
Monocytes Absolute: 0.4 x10E3/uL (ref 0.1–0.9)
Monocytes: 7 %
Neutrophils Absolute: 2.6 x10E3/uL (ref 1.4–7.0)
Neutrophils: 43 %
Platelets: 201 x10E3/uL (ref 150–450)
RBC: 4.98 x10E6/uL (ref 3.77–5.28)
RDW: 13.4 % (ref 11.7–15.4)
WBC: 6 x10E3/uL (ref 3.4–10.8)

## 2024-01-04 LAB — HEMOGLOBIN A1C
Est. average glucose Bld gHb Est-mCnc: 105 mg/dL
Hgb A1c MFr Bld: 5.3 % (ref 4.8–5.6)

## 2024-01-04 LAB — LIPID PANEL
Chol/HDL Ratio: 2.2 ratio (ref 0.0–4.4)
Cholesterol, Total: 235 mg/dL — ABNORMAL HIGH (ref 100–199)
HDL: 107 mg/dL (ref >39)
LDL Chol Calc (NIH): 118 mg/dL — ABNORMAL HIGH (ref 0–99)
Triglycerides: 58 mg/dL (ref 0–149)
VLDL Cholesterol Cal: 10 mg/dL (ref 5–40)

## 2024-01-04 LAB — VITAMIN D 25 HYDROXY (VIT D DEFICIENCY, FRACTURES): Vit D, 25-Hydroxy: 30.3 ng/mL (ref 30.0–100.0)

## 2024-01-09 ENCOUNTER — Ambulatory Visit: Payer: Self-pay | Admitting: Nurse Practitioner

## 2024-01-09 DIAGNOSIS — K529 Noninfective gastroenteritis and colitis, unspecified: Secondary | ICD-10-CM | POA: Insufficient documentation

## 2024-01-09 DIAGNOSIS — F900 Attention-deficit hyperactivity disorder, predominantly inattentive type: Secondary | ICD-10-CM | POA: Insufficient documentation

## 2024-01-09 NOTE — Assessment & Plan Note (Signed)
 Currently stable

## 2024-01-09 NOTE — Assessment & Plan Note (Signed)
 This is a new diagnosis for her by her behavioral health specialist. ADHD managed with Vyvanse, aiding in reducing alcohol cravings and appetite. - Continue Vyvanse as prescribed.

## 2024-01-09 NOTE — Assessment & Plan Note (Signed)
 Will check vitamin D  level and supplement as needed.    Also encouraged to spend 15 minutes in the sun daily.

## 2024-01-09 NOTE — Assessment & Plan Note (Signed)
 No change in size or symptoms. No swallowing difficulty or neck enlargement. - Monitor for symptoms such as difficulty swallowing or neck enlargement. - Checked thyroid  levels to ensure stability.

## 2024-01-09 NOTE — Assessment & Plan Note (Signed)
 Alcohol use reduced to three days a week. Vyvanse aiding in reducing cravings. - Continue Vyvanse as prescribed.

## 2024-01-09 NOTE — Assessment & Plan Note (Signed)
 Chronic diarrhea under evaluation. Colonoscopy performed. Probiotic and aloe added. No dietary triggers identified. - Continue probiotic and aloe regimen.

## 2024-02-01 ENCOUNTER — Encounter: Admitting: Internal Medicine

## 2024-02-09 ENCOUNTER — Ambulatory Visit: Attending: Internal Medicine | Admitting: Internal Medicine

## 2024-02-09 ENCOUNTER — Encounter: Payer: Self-pay | Admitting: Internal Medicine

## 2024-02-09 VITALS — BP 122/88 | HR 88 | Temp 97.8°F | Resp 16 | Ht 62.5 in | Wt 185.2 lb

## 2024-02-09 DIAGNOSIS — E042 Nontoxic multinodular goiter: Secondary | ICD-10-CM

## 2024-02-09 DIAGNOSIS — D591 Autoimmune hemolytic anemia, unspecified: Secondary | ICD-10-CM | POA: Diagnosis not present

## 2024-02-09 DIAGNOSIS — R7689 Other specified abnormal immunological findings in serum: Secondary | ICD-10-CM | POA: Diagnosis not present

## 2024-02-09 DIAGNOSIS — K760 Fatty (change of) liver, not elsewhere classified: Secondary | ICD-10-CM | POA: Diagnosis not present

## 2024-02-09 DIAGNOSIS — M238X9 Other internal derangements of unspecified knee: Secondary | ICD-10-CM | POA: Diagnosis not present

## 2024-02-09 DIAGNOSIS — M2550 Pain in unspecified joint: Secondary | ICD-10-CM | POA: Insufficient documentation

## 2024-02-09 NOTE — Assessment & Plan Note (Signed)
 Cindy Gilbert

## 2024-02-09 NOTE — Patient Instructions (Addendum)
 I recommend checking out the [facility]  patient-centered guide for fibromyalgia and chronic pain management: [email or web address]         Supplements for Osteoarthritis  Natural anti-inflammatories can help reduce inflammation and joint stiffness without some of the harmful side effects of non-steroidal anti-inflammatories (Advil, Motrin, Aleve, etc)  Recommend starting with one supplement and give a 3-4 week trial period before adding another  You may be able to find some of these products at your local pharmacy, but may also purchase at [employer], other specialty stores, or online   Turmeric Recommended dose 400mg  once a day May increase to twice a day if tolerated (may cause stomach upset) Do not take if you are on a blood thinner, and stop prior to surgery   Ginger (root or capsules) Recommended dose is 2 grams twice daily, or 2 cups of tea daily Do not take if you are on a blood thinner, and stop prior to surgery   Fish Oil or Omega 3 Recommended dose for capsule is 2 grams twice daily (make sure it contains at least 30% of EPA/DHA) For food, two 3-ounce servings of fish a week, or flaxseed, chia seeds, walnuts, and almonds   Tart Cherry (dried, extract, or tablets) Recommended dose is 500mg  once a day   *Although these are natural products, they can still interact with medications. Always consult with your doctor or pharmacist when starting new supplements and/or medications*  *Patents should be under the care of a physician or other medical provide while taking these supplements*

## 2024-02-09 NOTE — Assessment & Plan Note (Signed)
 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

## 2024-02-09 NOTE — Assessment & Plan Note (Signed)
 Arthralgias as above. Provided information on PFPS.

## 2024-02-09 NOTE — Assessment & Plan Note (Signed)
 Positive ANA also possibly related to her thyroid  if nothing identified today

## 2024-02-09 NOTE — Progress Notes (Signed)
 Office Visit Note  Patient: Cindy Gilbert             Date of Birth: 06-29-1975           MRN: [id number]             PCP: Georgina Speaks, FNP Referring: May, Deanna J, NP Visit Date: 02/09/2024 Occupation: Data Unavailable  Subjective:  New Patient (Initial Visit) and Abnormal Lab   Discussed the use of AI scribe software for clinical note transcription with the patient, who gave verbal consent to proceed.  History of Present Illness   Cindy Gilbert is a 48 year old female who presents with joint pains and a positive ANA test for further evaluation. She was referred by her gastroenterologist for evaluation of a positive ANA test checked in association with imaging fatty liver changes.  She has experienced joint pain and stiffness for the past five to six years, with no specific incident triggering these symptoms. The pain is described as tenderness, particularly affecting tasks requiring grip strength, such as opening small items. Soreness and tenderness are noted in various areas, including her hips and skin, which she has experienced all her life. The pain is not consistently present and varies day by day. Occasional numbness in her hands and tenderness in the bottom of her feet make walking painful. There is no associated swelling or redness in the joints, although she sometimes finds it difficult to remove rings due to overnight swelling.  She has a history of hemolytic anemia, diagnosed during a COVID-19 infection, which was treated with steroids and followed by a hematologist for six months. No previous or subsequent episodes and most recent blood counts normal. She has been in menopause since age 22, with no significant perimenopausal symptoms. She reports hair thinning, which she attributes to menopause, and occasional lumps in the neck that are not tender. She has history of multinodular goiter.  No rashes, scaling, or flaking of the skin are present.  She  reports abnormal bruising without known cause. No history of blood clots  She is a set designer and theatre manager. She has noticed discoloration in her fingers and toes when exposed to cold, turning red or purplish, which resolves with warming. She denies new symptoms or events in the past year. No severe dryness, ulcers, or sores in the mouth or nose. Occasionally notes swelling in her fingers evidenced by difficulty removing rings.      Labs reviewed 07/2023 ANA 1:80 homogenous AMA/ASMA neg  Activities of Daily Living:  Patient reports morning stiffness for 90 minutes.   Patient Denies nocturnal pain.  Difficulty dressing/grooming: Denies Difficulty climbing stairs: Denies Difficulty getting out of chair: Denies Difficulty using hands for taps, buttons, cutlery, and/or writing: Reports  Review of Systems  Constitutional:  Positive for fatigue.  HENT:  Positive for mouth dryness. Negative for mouth sores.   Eyes:  Positive for dryness.  Respiratory:  Negative for shortness of breath.   Cardiovascular:  Positive for palpitations. Negative for chest pain.  Gastrointestinal:  Positive for blood in stool, constipation and diarrhea.  Endocrine: Negative for increased urination.  Genitourinary:  Positive for involuntary urination.  Musculoskeletal:  Positive for joint pain, joint pain, joint swelling, morning stiffness and muscle tenderness. Negative for gait problem, myalgias, muscle weakness and myalgias.  Skin:  Negative for color change, rash, hair loss and sensitivity to sunlight.  Allergic/Immunologic: Negative for susceptible to infections.  Neurological:  Negative for dizziness and headaches.  Hematological:  Negative for swollen glands.  Psychiatric/Behavioral:  Positive for depressed mood and sleep disturbance. The patient is nervous/anxious.     PMFS History:  Patient Active Problem List   Diagnosis Date Noted   Positive ANA (antinuclear antibody) 02/09/2024    Arthralgia 02/09/2024   Hepatic steatosis 02/09/2024   Patellofemoral crepitus 02/09/2024   Chronic diarrhea 01/09/2024   Attention deficit hyperactivity disorder (ADHD), predominantly inattentive type 01/09/2024   Subluxation of unsp interphaln joint of unsp finger, subs 04/16/2023   Alcohol abuse 11/12/2022   Encounter for Papanicolaou smear of cervix 11/12/2022   Class 2 obesity due to excess calories with body mass index (BMI) of 36.0 to 36.9 in adult 11/12/2022   Class 2 obesity due to excess calories without serious comorbidity with body mass index (BMI) of 35.0 to 35.9 in adult 09/09/2022   Tetanus, diphtheria, and acellular pertussis (Tdap) vaccination declined 09/09/2022   Encounter for annual health examination 09/09/2022   Keratosis pilaris 04/22/2022   Post-inflammatory hyperpigmentation 04/22/2022   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/22/2022   Positive colorectal cancer screening using Cologuard test    Polyp of ascending colon    Right knee pain    COVID-19    Autoimmune hemolytic anemia (HCC) 04/10/2020   Chronic right hip pain 03/05/2020   S/P laparoscopic sleeve gastrectomy 03/05/2020   Heart murmur 12/27/2019   Mixed hyperlipidemia 12/06/2019   Ovarian cyst    Depression    Allergy     Multinodular goiter (nontoxic) 03/16/2019   Anxiety 03/22/2018   Vitamin D  deficiency 03/11/2018   Controlled substance agreement signed 12/18/2016   Insomnia 12/18/2016    Past Medical History:  Diagnosis Date   Allergy     Seasonal   Angio-edema    Anxiety 03/22/2018   Asthma    Autoimmune hemolytic anemia (HCC) 04/10/2020   Blood transfusion without reported diagnosis 02/2020   Chronic right hip pain 03/05/2020   Controlled substance agreement signed 12/18/2016   For xanax    COVID-19    Depression    previously on lexapro, celexa,    Dyslipidemia    Heart murmur 12/27/2019   Hip pain, chronic    Right   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/09/2020   Insomnia    Mixed hyperlipidemia 12/06/2019    Morbid obesity (HCC) 10/15/2019   Multinodular goiter (nontoxic) 03/16/2019   Small nodules- no need for bx or follow up imaging 2020   Ovarian cyst    left   Prediabetes 03/05/2020   Right knee pain    S/P laparoscopic sleeve gastrectomy 03/05/2020   Severe obesity (BMI >= 40) (HCC) 10/15/2019   Urticaria    Vitamin D  deficiency    Weight gain 09/09/2022    Family History  Problem Relation Age of Onset   Hypertension Mother    Hyperlipidemia Mother    Dementia Mother    Urticaria Father    Heart disease Father    Stroke Father    Heart attack Father    Hypertension Father    Hypertension Sister    Healthy Sister    Healthy Sister    Heart murmur Maternal Grandmother    Asthma Paternal Grandmother    Atrial fibrillation Maternal Aunt    Cancer Paternal Aunt        Breast   Allergic rhinitis Neg Hx    Eczema Neg Hx    Past Surgical History:  Procedure Laterality Date   ADENOIDECTOMY     CESAREAN SECTION  2001   COLON SURGERY  03/2021  COLONOSCOPY WITH PROPOFOL  N/A 02/25/2021   Procedure: COLONOSCOPY WITH PROPOFOL ;  Surgeon: Unk Corinn Skiff, MD;  Location: [facility] ENDOSCOPY;  Service: Endoscopy;  Laterality: N/A;   DILATION AND CURETTAGE OF UTERUS     ESOPHAGOGASTRODUODENOSCOPY     LAPAROSCOPIC GASTRIC SLEEVE RESECTION N/A 03/05/2020   Procedure: LAPAROSCOPIC GASTRIC SLEEVE RESECTION;  Surgeon: Tanda Locus, MD;  Location: [facility]S;  Service: General;  Laterality: N/A;   TONSILLECTOMY     TYMPANOSTOMY TUBE PLACEMENT     UPPER GI ENDOSCOPY N/A 03/05/2020   Procedure: UPPER GI ENDOSCOPY;  Surgeon: Tanda Locus, MD;  Location: [facility];  Service: General;  Laterality: N/A;   Social History[1] Social History   Social History Narrative   Not on file     Immunization History  Administered Date(s) Administered   DTaP 02/08/1976, 06/13/1976, 08/15/1976, 07/27/1977   IPV 07/14/1976, 09/13/1976, 07/27/1977   MMR 03/12/1977, 09/09/2001   Meningococcal polysaccharide  vaccine (MPSV4) 08/25/2011   Moderna Sars-Covid-2 Vaccination 05/12/2019, 06/12/2019   Tdap 11/25/2010     Objective: Vital Signs: BP 122/88 (BP Location: Right Arm, Patient Position: Sitting, Cuff Size: Normal)   Pulse 88   Temp 97.8 F (36.6 C)   Resp 16   Ht 5' 2.5 (1.588 m)   Wt 185 lb 3.2 oz (84 kg)   LMP  (LMP Unknown)   BMI 33.33 kg/m    Physical Exam Eyes:     Conjunctiva/sclera: Conjunctivae normal.  Cardiovascular:     Rate and Rhythm: Normal rate and regular rhythm.  Pulmonary:     Effort: Pulmonary effort is normal.     Breath sounds: Normal breath sounds.  Lymphadenopathy:     Cervical: No cervical adenopathy.  Skin:    General: Skin is warm and dry.     Comments: Mild hypopigmentation and puffiness below and lateral to eyes, no periorbital swelling Normal nailfold capillaries  Neurological:     Mental Status: She is alert.  Psychiatric:        Mood and Affect: Mood normal.      Musculoskeletal Exam:  Shoulders full ROM no tenderness or swelling Elbows full ROM no tenderness or swelling Wrists full ROM no tenderness or swelling Fingers full ROM no tenderness or swelling Hip normal internal and external rotation without pain, no tenderness to lateral hip palpation Knees full ROM no tenderness or swelling, mild patellofemoral crepitus b/l Ankles full ROM no tenderness or swelling   Investigation: No additional findings.  Imaging: No results found.  Recent Labs: Lab Results  Component Value Date   WBC 6.0 01/03/2024   HGB 14.4 01/03/2024   PLT 201 01/03/2024   NA 140 01/03/2024   K 4.0 01/03/2024   CL 101 01/03/2024   CO2 24 01/03/2024   GLUCOSE 79 01/03/2024   BUN 11 01/03/2024   CREATININE 0.70 01/03/2024   BILITOT 0.8 01/03/2024   ALKPHOS 95 01/03/2024   AST 20 01/03/2024   ALT 17 01/03/2024   PROT 6.8 01/03/2024   ALBUMIN 4.4 01/03/2024   CALCIUM 9.6 01/03/2024   GFRAA 117 11/07/2019    Speciality Comments: No specialty  comments available.  Procedures:  No procedures performed Allergies: Ambien [zolpidem tartrate], Belviq [lorcaserin ], Bupropion, and Cymbalta [duloxetine hcl]   Assessment / Plan:     Visit Diagnoses:  Assessment & Plan Positive ANA (antinuclear antibody) Positive ANA with negative autoimmune liver disease tests. Symptoms include joint pain, stiffness, and history of provoked one episode of autoimmune hemolytic anemia. Possibly raynauds but mild and  no changes on exam today. Nodules in neck most likely thyroid  related, no palpable adenopathy on exam today. Has multiple but nonspecific symptoms. Lower pretest disease supsicion.  - Ordered blood tests for lupus and other autoimmune conditions. - Provided information on chronic pain management and lifestyle modifications. 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    Arthralgia, unspecified joint Patellofemoral crepitus Chronic joint pain and stiffness for 5-6 years, with tenderness and possible degenerative arthritis. No immediate need for medication unless symptoms worsen. Maybe climacteric arthritis component but also has very early OA changes. - Provided information on chronic pain management and lifestyle modifications. - Recommended maintaining good strength in upper leg muscles and hip stability.    Multinodular goiter (nontoxic) Positive ANA also possibly related to her thyroid  if nothing identified today    Hepatic steatosis     Patellofemoral crepitus Arthralgias as above. Provided information on PFPS.       Follow-Up Instructions: No follow-ups on file.   Lonni LELON Ester, MD  Note - This  record has been created using [employer].  Chart creation errors have been sought, but may not always  have been located. Such creation errors do not reflect on  the standard of medical care.     [1]  Social History Tobacco Use   Smoking status: Former    Current packs/day: 0.00    Average packs/day: 0.3 packs/day for 10.0 years (2.5 ttl pk-yrs)    Types: Cigarettes    Start date: 02/24/2004    Quit date: 02/23/2014    Years since quitting: 9.9    Passive exposure: Never   Smokeless tobacco: Never  Vaping Use   Vaping status: Never Used  Substance Use Topics   Alcohol use: Yes    Alcohol/week: 3.0 standard drinks of alcohol    Types: 3 Glasses of wine per week    Comment: occ   Drug use: No

## 2024-02-09 NOTE — Assessment & Plan Note (Signed)
 Positive ANA with negative autoimmune liver disease tests. Symptoms include joint pain, stiffness, and history of provoked one episode of autoimmune hemolytic anemia. Possibly raynauds but mild and no changes on exam today. Nodules in neck most likely thyroid  related, no palpable adenopathy on exam today. Has multiple but nonspecific symptoms. Lower pretest disease supsicion.  - Ordered blood tests for lupus and other autoimmune conditions. - Provided information on chronic pain management and lifestyle modifications. - If positive results can f/u now vs observation based on symptom progression Orders:   Sedimentation rate   RNP Antibody   Anti-Smith antibody   Sjogrens syndrome-A extractable nuclear antibody   Anti-DNA antibody, double-stranded   C3 and C4

## 2024-02-09 NOTE — Assessment & Plan Note (Signed)
 Patellofemoral crepitus Chronic joint pain and stiffness for 5-6 years, with tenderness and possible degenerative arthritis. No immediate need for medication unless symptoms worsen. Maybe climacteric arthritis component but also has very early OA changes. - Provided information on chronic pain management and lifestyle modifications. - Recommended maintaining good strength in upper leg muscles and hip stability.

## 2024-02-10 LAB — SJOGRENS SYNDROME-A EXTRACTABLE NUCLEAR ANTIBODY: SSA (Ro) (ENA) Antibody, IgG: <1 AI

## 2024-02-10 LAB — ANTI-DNA ANTIBODY, DOUBLE-STRANDED: ds DNA Ab: 1 [IU]/mL

## 2024-02-10 LAB — RNP ANTIBODY: Ribonucleic Protein(ENA) Antibody, IgG: 1.6 AI — AB

## 2024-02-10 LAB — ANTI-SMITH ANTIBODY: ENA SM Ab Ser-aCnc: <1 AI

## 2024-02-10 LAB — C3 AND C4
C3 Complement: 153 mg/dL (ref 83–193)
C4 Complement: 35 mg/dL (ref 15–57)

## 2024-02-10 LAB — SEDIMENTATION RATE: Sed Rate: 11 mm/h (ref 0–20)

## 2024-02-28 ENCOUNTER — Encounter: Payer: Self-pay | Admitting: Internal Medicine

## 2024-03-02 ENCOUNTER — Ambulatory Visit: Admitting: Podiatry

## 2024-03-02 ENCOUNTER — Encounter: Payer: Self-pay | Admitting: Podiatry

## 2024-03-02 ENCOUNTER — Ambulatory Visit (INDEPENDENT_AMBULATORY_CARE_PROVIDER_SITE_OTHER)

## 2024-03-02 VITALS — Ht 62.5 in | Wt 185.2 lb

## 2024-03-02 DIAGNOSIS — M722 Plantar fascial fibromatosis: Secondary | ICD-10-CM

## 2024-03-02 MED ORDER — MELOXICAM 15 MG PO TABS: 15.0000 mg | ORAL_TABLET | Freq: Every day | ORAL | 3 refills | Status: AC

## 2024-03-02 MED ORDER — TRIAMCINOLONE ACETONIDE 40 MG/ML IJ SUSP
20.0000 mg | Freq: Once | INTRAMUSCULAR | Status: AC
  Administered 2024-03-02: 20 mg

## 2024-03-02 NOTE — Progress Notes (Signed)
 "  Subjective:  Patient ID: Cindy Gilbert, female    DOB: 11-10-1975,  MRN: [id number] HPI Chief Complaint  Patient presents with   Plantar Fasciitis    Pt is here due to bilateral heel pain, states the pain began a week ago, no injuries, states it feels like she is walking on pins and needles, and has a pulling feeling to the heel when she moves her foot a certain way, no prior treatment before this visit, states she has been trying to avoid walking on her heels due to pain.    49 y.o. female presents with the above complaint.   ROS: Denies fever chills nausea vomit muscle aches pains calf pain back pain chest pain shortness of breath.  Past Medical History:  Diagnosis Date   Allergy     Seasonal   Angio-edema    Anxiety 03/22/2018   Asthma    Autoimmune hemolytic anemia (HCC) 04/10/2020   Blood transfusion without reported diagnosis 02/2020   Chronic right hip pain 03/05/2020   Controlled substance agreement signed 12/18/2016   For xanax    COVID-19    Depression    previously on lexapro, celexa,    Dyslipidemia    Heart murmur 12/27/2019   Hip pain, chronic    Right   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/09/2020   Insomnia    Mixed hyperlipidemia 12/06/2019   Morbid obesity (HCC) 10/15/2019   Multinodular goiter (nontoxic) 03/16/2019   Small nodules- no need for bx or follow up imaging 2020   Ovarian cyst    left   Prediabetes 03/05/2020   Right knee pain    S/P laparoscopic sleeve gastrectomy 03/05/2020   Severe obesity (BMI >= 40) (HCC) 10/15/2019   Urticaria    Vitamin D  deficiency    Weight gain 09/09/2022   Past Surgical History:  Procedure Laterality Date   ADENOIDECTOMY     CESAREAN SECTION  2001   COLON SURGERY  03/2021   COLONOSCOPY WITH PROPOFOL  N/A 02/25/2021   Procedure: COLONOSCOPY WITH PROPOFOL ;  Surgeon: Unk Corinn Skiff, MD;  Location: [facility] ENDOSCOPY;  Service: Endoscopy;  Laterality: N/A;   DILATION AND CURETTAGE OF UTERUS     ESOPHAGOGASTRODUODENOSCOPY      LAPAROSCOPIC GASTRIC SLEEVE RESECTION N/A 03/05/2020   Procedure: LAPAROSCOPIC GASTRIC SLEEVE RESECTION;  Surgeon: Tanda Locus, MD;  Location: [facility];  Service: General;  Laterality: N/A;   TONSILLECTOMY     TYMPANOSTOMY TUBE PLACEMENT     UPPER GI ENDOSCOPY N/A 03/05/2020   Procedure: UPPER GI ENDOSCOPY;  Surgeon: Tanda Locus, MD;  Location: [facility];  Service: General;  Laterality: N/A;   Current Medications[1]  Allergies[2] Review of Systems Objective:  There were no vitals filed for this visit.  General: Well developed, nourished, in no acute distress, alert and oriented x3   Dermatological: Skin is warm, dry and supple bilateral. Nails x 10 are well maintained; remaining integument appears unremarkable at this time. There are no open sores, no preulcerative lesions, no rash or signs of infection present.  Vascular: Dorsalis Pedis artery and Posterior Tibial artery pedal pulses are 2/4 bilateral with immedate capillary fill time. Pedal hair growth present. No varicosities and no lower extremity edema present bilateral.   Neruologic: Grossly intact via light touch bilateral. Vibratory intact via tuning fork bilateral. Protective threshold with Semmes Wienstein monofilament intact to all pedal sites bilateral. Patellar and Achilles deep tendon reflexes 2+ bilateral. No Babinski or clonus noted bilateral.   Musculoskeletal: No gross boney pedal deformities bilateral. No  pain, crepitus, or limitation noted with foot and ankle range of motion bilateral. Muscular strength 5/5 in all groups tested bilateral.  Flexible pes planovalgus bilateral.  She does have some mild gastroc equinus bilateral.  She has pain on palpation central and lateral calcaneal tubercles bilaterally.  Radiographs taken today demonstrate soft tissue increase in density in the central aspect of the calcaneus left greater than right.  Gait: Unassisted, Nonantalgic.    Radiographs:  Radiographs taken today demonstrate  osseously mature individual good bone mineralization.  Pes planovalgus bilateral.  No coalitions identified.  Does demonstrate soft tissue increase in density at the plantar fascial canny insertion site left greater than right.  No acute findings identified.  Assessment & Plan:   Assessment: Plantar fasciitis and pes planovalgus bilateral.  Plan: Discussed etiology pathology conservative surgical therapies at this point I recommended IM bilateral injections and appropriate shoe gear for which she does wear orthotics in her tennis shoes regularly which do help considerably she states.  Started her on meloxicam  did not utilize the steroidal anti-inflammatories.  We did discuss possible need for new orthotics or physical therapy.  Should she want physical therapy we will send her for that particularly for stretching of her gastrosoleus complex hamstrings and glutes.  Follow-up with her in 2 months     Arnisha Laffoon T. Rolinda Impson, DPM    [1]  Current Outpatient Medications:    amphetamine-dextroamphetamine (ADDERALL) 10 MG tablet, Take 5-10 mg by mouth daily as needed., Disp: , Rfl:    BIOTIN PO, Take by mouth., Disp: , Rfl:    Calcium Carb-Cholecalciferol (CALCIUM 500 + D PO), Take 500 mg by mouth 3 (three) times daily., Disp: , Rfl:    Carbinoxamine  Maleate (RYVENT ) 6 MG TABS, Take 1 tablet (6 mg total) by mouth 2 (two) times daily., Disp: 60 tablet, Rfl: 2   Cyanocobalamin (B-12 PO), Take by mouth., Disp: , Rfl:    EPINEPHrine  (EPIPEN  2-PAK) 0.3 mg/0.3 mL IJ SOAJ injection, Inject 0.3 mg into the muscle as needed for anaphylaxis., Disp: 2 each, Rfl: 1   estradiol  (ESTRACE ) 0.01 % CREA vaginal cream, Place vaginally., Disp: , Rfl:    hydrOXYzine (ATARAX) 25 MG tablet, Take 25 mg by mouth 2 (two) times daily., Disp: , Rfl:    lisdexamfetamine (VYVANSE) 30 MG capsule, Take 30 mg by mouth every morning., Disp: , Rfl:    lisdexamfetamine (VYVANSE) 40 MG capsule, Take 40 mg by mouth every morning., Disp: , Rfl:     meloxicam  (MOBIC ) 15 MG tablet, Take 1 tablet (15 mg total) by mouth daily., Disp: 30 tablet, Rfl: 3   mometasone  (NASONEX ) 50 MCG/ACT nasal spray, Place 2 sprays into the nose daily., Disp: 17 g, Rfl: 5   Multiple Vitamins-Minerals (MULTIVITAMIN WITH MINERALS) tablet, Take 1 tablet by mouth daily., Disp: , Rfl:    Olopatadine -Mometasone  (RYALTRIS ) 665-25 MCG/ACT SUSP, Place 2 sprays into the nose 2 (two) times daily., Disp: , Rfl:    Olopatadine -Mometasone  (RYALTRIS ) 665-25 MCG/ACT SUSP, Place 2 sprays into the nose 2 (two) times daily as needed (runny or stuffy nose)., Disp: 29 g, Rfl: 5   temazepam (RESTORIL) 7.5 MG capsule, Take 7.5 mg by mouth at bedtime as needed., Disp: , Rfl:  [2]  Allergies Allergen Reactions   Ambien [Zolpidem Tartrate] Itching   Belviq [Lorcaserin ] Itching   Bupropion Itching   Cymbalta [Duloxetine Hcl] Other (See Comments)    Headache   "

## 2024-03-02 NOTE — Addendum Note (Signed)
 Addended by: VERTA BOAS T on: 03/02/2024 05:13 PM   Modules accepted: Orders

## 2024-03-02 NOTE — Progress Notes (Deleted)
 Subjective:   Patient ID: Cindy Gilbert, female   DOB: 49 y.o.   MRN: [id number]   HPI ***   ROS      Objective:  Physical Exam  ***     Assessment:  ***     Plan:  ***

## 2024-03-13 ENCOUNTER — Telehealth: Payer: Self-pay

## 2024-03-13 NOTE — Telephone Encounter (Signed)
 Contacted the patient and advised Daved Holstein, PA-C, has reviewed her lab results. Her RNP antibody was slightly positive, but her other lab work was all within normal limits. Dr. Jeannetta suggests to follow up as symptoms worsen or progress, or sooner if needed. Patient verbalized understanding.

## 2024-03-13 NOTE — Telephone Encounter (Signed)
 Patient contacted the office requesting her lab results because it has been a month. Please advise.

## 2024-03-30 ENCOUNTER — Ambulatory Visit: Admitting: Podiatry

## 2024-03-30 DIAGNOSIS — M722 Plantar fascial fibromatosis: Secondary | ICD-10-CM

## 2024-03-31 NOTE — Progress Notes (Signed)
 She presents today for a follow-up of her bilateral plantar fasciitis.  States that is better but the left one is still hurting to some degree.  She also goes on to say that she is not much of a pill taker and she has not been taking the anti-inflammatory that we prescribed primarily due to GI distress.  Objective: Signs are stable alert and oriented x 3 pulses are palpable left lower extremity minimal pain on palpation medial calcaneal tubercle of the left heel.  Some tenderness on palpation of the Achilles particularly near its insertion site.  This is only left foot right foot does not demonstrate any type of symptomatology.  Assessment: Well-healing plantar fasciitis right slowly healing plantar fasciitis left.  Plan: Recommended physical therapy to help reduce her symptomatology of her left foot.  Follow-up with her should this fail to render her asymptomatic.

## 2024-04-07 ENCOUNTER — Ambulatory Visit

## 2024-05-24 ENCOUNTER — Ambulatory Visit: Admitting: Allergy

## 2024-07-03 ENCOUNTER — Ambulatory Visit: Admitting: Nurse Practitioner

## 2025-01-03 ENCOUNTER — Encounter: Admitting: Nurse Practitioner
